# Patient Record
Sex: Male | Born: 1980 | Race: Black or African American | Hispanic: No | Marital: Single | Smoking: Never smoker
Health system: Southern US, Community
[De-identification: ages and names within clinical notes are randomized; demographics above are authoritative.]

## PROBLEM LIST (undated history)

## (undated) DIAGNOSIS — I10 Essential (primary) hypertension: Secondary | ICD-10-CM

## (undated) DIAGNOSIS — N289 Disorder of kidney and ureter, unspecified: Secondary | ICD-10-CM

## (undated) DIAGNOSIS — K219 Gastro-esophageal reflux disease without esophagitis: Secondary | ICD-10-CM

## (undated) DIAGNOSIS — R229 Localized swelling, mass and lump, unspecified: Secondary | ICD-10-CM

## (undated) DIAGNOSIS — IMO0002 Reserved for concepts with insufficient information to code with codable children: Secondary | ICD-10-CM

## (undated) DIAGNOSIS — M329 Systemic lupus erythematosus, unspecified: Secondary | ICD-10-CM

## (undated) HISTORY — DX: Gastro-esophageal reflux disease without esophagitis: K21.9

## (undated) HISTORY — PX: DIALYSIS/PERMA CATHETER INSERTION: CATH118288

## (undated) HISTORY — PX: TONSILLECTOMY: SUR1361

## (undated) HISTORY — PX: WISDOM TOOTH EXTRACTION: SHX21

---

## 2003-08-16 ENCOUNTER — Emergency Department (HOSPITAL_COMMUNITY): Admission: EM | Admit: 2003-08-16 | Discharge: 2003-08-16 | Payer: Self-pay

## 2004-08-16 ENCOUNTER — Emergency Department (HOSPITAL_COMMUNITY): Admission: EM | Admit: 2004-08-16 | Discharge: 2004-08-17 | Payer: Self-pay | Admitting: Emergency Medicine

## 2006-01-26 ENCOUNTER — Emergency Department (HOSPITAL_COMMUNITY): Admission: EM | Admit: 2006-01-26 | Discharge: 2006-01-26 | Payer: Self-pay | Admitting: Internal Medicine

## 2006-07-26 ENCOUNTER — Emergency Department (HOSPITAL_COMMUNITY): Admission: EM | Admit: 2006-07-26 | Discharge: 2006-07-27 | Payer: Self-pay | Admitting: Emergency Medicine

## 2006-11-20 ENCOUNTER — Emergency Department (HOSPITAL_COMMUNITY): Admission: EM | Admit: 2006-11-20 | Discharge: 2006-11-20 | Payer: Self-pay | Admitting: Emergency Medicine

## 2006-11-27 ENCOUNTER — Ambulatory Visit: Payer: Self-pay | Admitting: Internal Medicine

## 2007-10-14 ENCOUNTER — Emergency Department (HOSPITAL_COMMUNITY): Admission: EM | Admit: 2007-10-14 | Discharge: 2007-10-14 | Payer: Self-pay | Admitting: Emergency Medicine

## 2008-06-23 ENCOUNTER — Emergency Department (HOSPITAL_COMMUNITY): Admission: EM | Admit: 2008-06-23 | Discharge: 2008-06-23 | Payer: Self-pay | Admitting: Emergency Medicine

## 2008-06-26 ENCOUNTER — Ambulatory Visit: Payer: Self-pay | Admitting: Critical Care Medicine

## 2008-06-26 DIAGNOSIS — J479 Bronchiectasis, uncomplicated: Secondary | ICD-10-CM

## 2008-07-01 ENCOUNTER — Telehealth (INDEPENDENT_AMBULATORY_CARE_PROVIDER_SITE_OTHER): Payer: Self-pay | Admitting: *Deleted

## 2008-07-24 ENCOUNTER — Ambulatory Visit: Payer: Self-pay | Admitting: Critical Care Medicine

## 2008-08-05 ENCOUNTER — Ambulatory Visit: Payer: Self-pay | Admitting: Internal Medicine

## 2008-08-05 ENCOUNTER — Ambulatory Visit: Payer: Self-pay | Admitting: Cardiology

## 2008-08-05 ENCOUNTER — Inpatient Hospital Stay (HOSPITAL_COMMUNITY): Admission: EM | Admit: 2008-08-05 | Discharge: 2008-08-11 | Payer: Self-pay | Admitting: Emergency Medicine

## 2008-08-05 DIAGNOSIS — I309 Acute pericarditis, unspecified: Secondary | ICD-10-CM | POA: Insufficient documentation

## 2008-08-06 ENCOUNTER — Encounter (INDEPENDENT_AMBULATORY_CARE_PROVIDER_SITE_OTHER): Payer: Self-pay | Admitting: Emergency Medicine

## 2008-08-10 ENCOUNTER — Encounter: Payer: Self-pay | Admitting: Cardiology

## 2008-08-18 ENCOUNTER — Ambulatory Visit: Payer: Self-pay | Admitting: Critical Care Medicine

## 2008-08-23 ENCOUNTER — Ambulatory Visit: Payer: Self-pay | Admitting: Internal Medicine

## 2008-08-23 ENCOUNTER — Inpatient Hospital Stay (HOSPITAL_COMMUNITY): Admission: EM | Admit: 2008-08-23 | Discharge: 2008-09-01 | Payer: Self-pay | Admitting: Emergency Medicine

## 2008-08-23 ENCOUNTER — Encounter: Payer: Self-pay | Admitting: Internal Medicine

## 2008-08-26 ENCOUNTER — Ambulatory Visit: Payer: Self-pay | Admitting: Cardiology

## 2008-08-27 ENCOUNTER — Other Ambulatory Visit: Payer: Self-pay | Admitting: Cardiology

## 2008-08-28 ENCOUNTER — Other Ambulatory Visit: Payer: Self-pay | Admitting: Cardiology

## 2008-08-28 ENCOUNTER — Encounter: Payer: Self-pay | Admitting: Cardiology

## 2008-08-31 ENCOUNTER — Encounter: Payer: Self-pay | Admitting: Cardiology

## 2008-09-08 ENCOUNTER — Encounter: Payer: Self-pay | Admitting: Critical Care Medicine

## 2008-10-06 ENCOUNTER — Ambulatory Visit: Payer: Self-pay | Admitting: Cardiology

## 2008-10-08 ENCOUNTER — Encounter: Payer: Self-pay | Admitting: Cardiology

## 2008-10-08 ENCOUNTER — Ambulatory Visit: Payer: Self-pay

## 2008-11-10 ENCOUNTER — Encounter: Payer: Self-pay | Admitting: Cardiology

## 2008-11-10 ENCOUNTER — Ambulatory Visit: Payer: Self-pay | Admitting: Cardiology

## 2008-11-10 ENCOUNTER — Encounter: Payer: Self-pay | Admitting: Physician Assistant

## 2008-11-10 DIAGNOSIS — I319 Disease of pericardium, unspecified: Secondary | ICD-10-CM | POA: Insufficient documentation

## 2009-04-02 ENCOUNTER — Telehealth (INDEPENDENT_AMBULATORY_CARE_PROVIDER_SITE_OTHER): Payer: Self-pay | Admitting: *Deleted

## 2009-04-06 ENCOUNTER — Emergency Department (HOSPITAL_COMMUNITY): Admission: EM | Admit: 2009-04-06 | Discharge: 2009-04-07 | Payer: Self-pay | Admitting: Emergency Medicine

## 2009-04-08 ENCOUNTER — Emergency Department (HOSPITAL_COMMUNITY): Admission: EM | Admit: 2009-04-08 | Discharge: 2009-04-09 | Payer: Self-pay | Admitting: Emergency Medicine

## 2009-06-16 ENCOUNTER — Telehealth (INDEPENDENT_AMBULATORY_CARE_PROVIDER_SITE_OTHER): Payer: Self-pay | Admitting: *Deleted

## 2009-09-27 ENCOUNTER — Emergency Department (HOSPITAL_COMMUNITY): Admission: EM | Admit: 2009-09-27 | Discharge: 2009-09-27 | Payer: Self-pay | Admitting: Emergency Medicine

## 2009-09-30 ENCOUNTER — Ambulatory Visit: Payer: Self-pay | Admitting: Critical Care Medicine

## 2009-09-30 DIAGNOSIS — J209 Acute bronchitis, unspecified: Secondary | ICD-10-CM | POA: Insufficient documentation

## 2009-09-30 DIAGNOSIS — K1120 Sialoadenitis, unspecified: Secondary | ICD-10-CM

## 2009-12-03 ENCOUNTER — Encounter (INDEPENDENT_AMBULATORY_CARE_PROVIDER_SITE_OTHER): Payer: Self-pay | Admitting: *Deleted

## 2010-01-13 ENCOUNTER — Encounter: Payer: Self-pay | Admitting: Cardiology

## 2010-01-20 ENCOUNTER — Telehealth (INDEPENDENT_AMBULATORY_CARE_PROVIDER_SITE_OTHER): Payer: Self-pay | Admitting: *Deleted

## 2010-05-19 ENCOUNTER — Emergency Department (HOSPITAL_COMMUNITY): Admission: EM | Admit: 2010-05-19 | Discharge: 2010-05-19 | Payer: Self-pay | Admitting: Emergency Medicine

## 2010-05-23 ENCOUNTER — Emergency Department (HOSPITAL_COMMUNITY): Admission: EM | Admit: 2010-05-23 | Discharge: 2010-05-23 | Payer: Self-pay | Admitting: Emergency Medicine

## 2010-07-23 ENCOUNTER — Emergency Department (HOSPITAL_COMMUNITY): Admission: EM | Admit: 2010-07-23 | Discharge: 2010-07-23 | Payer: Self-pay | Admitting: Emergency Medicine

## 2010-11-08 NOTE — Progress Notes (Signed)
Summary: Pulmonary will continue to treat.  ---- Converted from flag ---- ---- 01/13/2010 8:38 PM, Elsie Stain MD wrote: I dont know why he will not make his appts with cardiology he has been consistent in his ov with me and is not hard to care for  I disagree with this,  however understand why you dismiss him , but I will still see him in pulmonary    pw   ---- 01/13/2010 2:12 PM, Sherri Sear wrote: This patient is dismissed from Cardiology by Minus Breeding, MD. Do you agree with the dismissal or do you wish to continue to treat this patient? Thank You. ------------------------------

## 2010-11-08 NOTE — Letter (Signed)
Summary: Discharge Letter  [facility] [address] [place], [place] [address]          January 13, 2010 MRN: [id number]   [place],   [address]   Dear Mr. Adamczak,  I find it necessary to inform you that I will not be able to provide medical care to you, because you have cancelled late or no showed for four appointments in the last year.                                                                                                .  Since your condition requires medical attention, I suggest that you place your self under the care of another physician without delay. If you desire, I will be available for emergency care for 30 days after you receive this letter.  This should give you ample time to select a physician of your choice from the many competent providers in this area. You may want to call the local medical society or [facility]'s physician referral service [phone]) for their assistance in locating a new physician. With your written authorization, I will make a copy of your medical record available to your new physician.   Sincerely,  [facility]

## 2010-11-08 NOTE — Letter (Signed)
Summary: Appointment - Missed  [place], [place]    Phone:   Fax:      December 03, 2009 MRN: [id number]   [facility] [place], [place]  [address]   Dear Jorge [name],  Our records indicate you missed your appointment on   12-02-2009  with  Dr. Warren Lacy   It is very important that we reach you to reschedule this appointment. We look forward to participating in your health care needs. Please contact us at the number listed above at your earliest convenience to reschedule this appointment.     Sincerely,     [facility]

## 2010-12-21 LAB — CBC
HCT: 48 % (ref 39.0–52.0)
Hemoglobin: 15.8 g/dL (ref 13.0–17.0)
MCH: 27.1 pg (ref 26.0–34.0)
MCHC: 32.9 g/dL (ref 30.0–36.0)
MCV: 82.3 fL (ref 78.0–100.0)
Platelets: 195 10*3/uL (ref 150–400)
RBC: 5.83 MIL/uL — ABNORMAL HIGH (ref 4.22–5.81)
RDW: 14.3 % (ref 11.5–15.5)
WBC: 4.1 10*3/uL (ref 4.0–10.5)

## 2010-12-21 LAB — POCT I-STAT 3, ART BLOOD GAS (G3+)
Acid-Base Excess: 2 mmol/L (ref 0.0–2.0)
Bicarbonate: 25.9 mEq/L — ABNORMAL HIGH (ref 20.0–24.0)
O2 Saturation: 96 %
Patient temperature: 98.6
TCO2: 27 mmol/L (ref 0–100)
pCO2 arterial: 38.4 mmHg (ref 35.0–45.0)
pH, Arterial: 7.438 (ref 7.350–7.450)
pO2, Arterial: 79 mmHg — ABNORMAL LOW (ref 80.0–100.0)

## 2010-12-21 LAB — BASIC METABOLIC PANEL
BUN: 7 mg/dL (ref 6–23)
CO2: 27 mEq/L (ref 19–32)
Calcium: 9.3 mg/dL (ref 8.4–10.5)
Chloride: 104 mEq/L (ref 96–112)
Creatinine, Ser: 0.92 mg/dL (ref 0.4–1.5)
GFR calc Af Amer: >60 mL/min (ref >60)
GFR calc non Af Amer: >60 mL/min (ref >60)
Glucose, Bld: 69 mg/dL — ABNORMAL LOW (ref 70–99)
Potassium: 3.9 mEq/L (ref 3.5–5.1)
Sodium: 138 mEq/L (ref 135–145)

## 2010-12-21 LAB — URINE MICROSCOPIC-ADD ON

## 2010-12-21 LAB — SEDIMENTATION RATE: Sed Rate: 3 mm/hr (ref 0–16)

## 2010-12-21 LAB — URINALYSIS, ROUTINE W REFLEX MICROSCOPIC
Bilirubin Urine: NEGATIVE
Glucose, UA: NEGATIVE mg/dL
Hgb urine dipstick: NEGATIVE
Ketones, ur: NEGATIVE mg/dL
Leukocytes, UA: NEGATIVE
Nitrite: NEGATIVE
Protein, ur: 30 mg/dL — AB
Specific Gravity, Urine: 1.011 (ref 1.005–1.030)
Urobilinogen, UA: 0.2 mg/dL (ref 0.0–1.0)
pH: 7 (ref 5.0–8.0)

## 2010-12-21 LAB — DIFFERENTIAL
Basophils Absolute: 0 10*3/uL (ref 0.0–0.1)
Basophils Relative: 1 % (ref 0–1)
Eosinophils Absolute: 0.1 10*3/uL (ref 0.0–0.7)
Eosinophils Relative: 2 % (ref 0–5)
Lymphocytes Relative: 52 % — ABNORMAL HIGH (ref 12–46)
Lymphs Abs: 2.2 10*3/uL (ref 0.7–4.0)
Monocytes Absolute: 0.5 10*3/uL (ref 0.1–1.0)
Monocytes Relative: 12 % (ref 3–12)
Neutro Abs: 1.3 10*3/uL — ABNORMAL LOW (ref 1.7–7.7)
Neutrophils Relative %: 33 % — ABNORMAL LOW (ref 43–77)

## 2010-12-22 LAB — COMPREHENSIVE METABOLIC PANEL
ALT: 41 U/L (ref 0–53)
BUN: 7 mg/dL (ref 6–23)
CO2: 26 mEq/L (ref 19–32)
Calcium: 9.2 mg/dL (ref 8.4–10.5)
GFR calc Af Amer: >60 mL/min (ref >60)
GFR calc non Af Amer: >60 mL/min (ref >60)
Glucose, Bld: 77 mg/dL (ref 70–99)
Potassium: 3.8 mEq/L (ref 3.5–5.1)
Total Protein: 8.8 g/dL — ABNORMAL HIGH (ref 6.0–8.3)

## 2010-12-22 LAB — SEDIMENTATION RATE: Sed Rate: 7 mm/hr (ref 0–16)

## 2010-12-22 LAB — DIFFERENTIAL
Basophils Relative: 0 % (ref 0–1)
Eosinophils Absolute: 0.1 10*3/uL (ref 0.0–0.7)
Eosinophils Relative: 1 % (ref 0–5)
Lymphocytes Relative: 51 % — ABNORMAL HIGH (ref 12–46)
Monocytes Absolute: 1 10*3/uL (ref 0.1–1.0)
Neutrophils Relative %: 31 % — ABNORMAL LOW (ref 43–77)

## 2010-12-22 LAB — CBC
Platelets: 200 10*3/uL (ref 150–400)
RBC: 5.73 MIL/uL (ref 4.22–5.81)
WBC: 6 10*3/uL (ref 4.0–10.5)

## 2011-01-09 LAB — SEDIMENTATION RATE: Sed Rate: 45 mm/hr — ABNORMAL HIGH (ref 0–16)

## 2011-01-09 LAB — CBC
MCHC: 31.5 g/dL (ref 30.0–36.0)
MCV: 76.3 fL — ABNORMAL LOW (ref 78.0–100.0)
Platelets: 295 10*3/uL (ref 150–400)
WBC: 8.3 10*3/uL (ref 4.0–10.5)

## 2011-01-09 LAB — POCT I-STAT, CHEM 8
Calcium, Ion: 1.15 mmol/L (ref 1.12–1.32)
HCT: 44 % (ref 39.0–52.0)
Hemoglobin: 15 g/dL (ref 13.0–17.0)
Sodium: 141 mEq/L (ref 135–145)
TCO2: 28 mmol/L (ref 0–100)

## 2011-01-09 LAB — DIFFERENTIAL
Basophils Relative: 1 % (ref 0–1)
Eosinophils Absolute: 0 10*3/uL (ref 0.0–0.7)
Neutro Abs: 5.5 10*3/uL (ref 1.7–7.7)
Neutrophils Relative %: 67 % (ref 43–77)

## 2011-01-09 LAB — MONONUCLEOSIS SCREEN: Mono Screen: NEGATIVE

## 2011-01-15 LAB — POCT CARDIAC MARKERS
Myoglobin, poc: 250 ng/mL (ref 12–200)
Troponin i, poc: <0.05 ng/mL (ref 0.00–0.09)

## 2011-01-15 LAB — BASIC METABOLIC PANEL
Calcium: 8.7 mg/dL (ref 8.4–10.5)
Creatinine, Ser: 0.76 mg/dL (ref 0.4–1.5)
GFR calc Af Amer: >60 mL/min (ref >60)
GFR calc non Af Amer: >60 mL/min (ref >60)
Sodium: 138 mEq/L (ref 135–145)

## 2011-01-15 LAB — DIFFERENTIAL
Basophils Relative: 1 % (ref 0–1)
Lymphocytes Relative: 37 % (ref 12–46)
Monocytes Relative: 11 % (ref 3–12)
Neutro Abs: 3.6 10*3/uL (ref 1.7–7.7)
Neutrophils Relative %: 51 % (ref 43–77)

## 2011-01-15 LAB — CBC
MCHC: 31.8 g/dL (ref 30.0–36.0)
RBC: 4.61 MIL/uL (ref 4.22–5.81)
WBC: 6.9 10*3/uL (ref 4.0–10.5)

## 2011-01-15 LAB — URINALYSIS, ROUTINE W REFLEX MICROSCOPIC
Bilirubin Urine: NEGATIVE
Glucose, UA: NEGATIVE mg/dL
Ketones, ur: NEGATIVE mg/dL
Nitrite: NEGATIVE
pH: 6.5 (ref 5.0–8.0)

## 2011-01-15 LAB — SEDIMENTATION RATE: Sed Rate: 50 mm/hr — ABNORMAL HIGH (ref 0–16)

## 2011-01-31 ENCOUNTER — Ambulatory Visit: Payer: Self-pay | Admitting: Physician Assistant

## 2011-02-21 NOTE — H&P (Signed)
NAMEMarland Kitchen  Jorge [name], Jorge [name] NO.:  [id number]   MEDICAL RECORD NO.:  [id number]          PATIENT TYPE:  INP   LOCATION:  2922                         FACILITY:  [facility]   PHYSICIAN:  Christena Deem. Melvyn Novas, MD, FCCPDATE OF BIRTH:  06-Apr[birth year]1983   DATE OF ADMISSION:  08/05/2008  DATE OF DISCHARGE:                              HISTORY & PHYSICAL   CHIEF COMPLAINT:  Shortness of breath and cough as well as chest  discomfort.   HISTORY OF PRESENT ILLNESS:  This is a 30 year old chronically ill  African American male who states he was in his usual state of health on  August 02, 2008.  At that point, he began to recall initially a  nonproductive cough.  He did report a recent sick exposure to his little  brother who had upper respiratory tract infection symptoms.  On October  27/28, 2009, he began to develop increased nasal drainage.  First noted  fever as high as 102 on the August 04, 2008.  On August 03, 2008, he  developed increased chest discomfort primarily noted with cough, and  deep breath, this did not improve with repositioning, it was resultant  in significant insomnia and discomfort.  He began to notice increased  shortness of breath on August 04, 2008, much above his baseline.  He  began to become more orthopneic during the evening of the August 04, 2008, he developed progressive exertional dyspnea.  On the a.m. of  August 05, 2008, he developed acute resting dyspnea which resulted in  him reporting to the emergency room.  Additionally, he did note that on  August 04, 2008, he began to notice that his cough became productive  purulent yellow thick sputum.  He denies hemoptysis, lower extremity  edema, did have 1 episode of vomiting.  Chest x-ray on presentation  demonstrate bilateral pulmonary infiltrates, right greater than left,  treatment to date has included IV Rocephin, supplemental oxygen which he  reports his dyspnea is a bit improved, however, he continues to  have  significant chest and back discomfort.   PAST MEDICAL HISTORY:  1. 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  2. History of asthma.  3. Esophageal dysmotility.   SOCIAL HISTORY:  Unemployed, lives with his mother.  Never smoker, last  seen by Dr. Asencion Noble in October 2009.  He did get his flu vaccine  this year, states at baseline he can tolerate normal ADLs.  He states he  can walk approximately 5 miles at baseline without difficulty.   FAMILY HISTORY:  Negative for pulmonary disease.   ALLERGIES:  He states he is allergic to PREDNISONE, develops agitation  and night sweats.   HOME MEDICATIONS:  Include Symbicort 160/4.5 two puffs b.i.d. and p.r.n.  ProAir HFA.   REVIEW OF SYSTEMS:  Per HPI.  No other pertinent positives.   PHYSICAL EXAMINATION:  VITAL SIGNS:  Currently, heart rate 110-118,  blood pressure 118/57, respirations 20-30, saturations 92% on 3 liters  nasal cannula, and temperature is pending.  GENERAL:  This is a ill-appearing 30 year old male patient currently  with multiple systemic complaints, but no acute distress.  HEENT:  Normocephalic.  No JVD.  He does have painful anterior cervical  adenopathy to palpation.  Mucous membranes are dry, sclerae are  nonicteric.  PULMONARY:  Basilar rales bilaterally with scattered rhonchi.  No  accessory muscle use.  CARDIAC:  Regular rate and rhythm.  EXTREMITIES:  No edema, there is clubbing noted on both hands.  ABDOMEN:  Soft and nontender.  GU:  Clear yellow urine.  NEUROLOGIC:  Grossly intact.   LABORATORY DATA:  Hemoglobin 13.3 and hematocrit 39.  Sodium 137,  potassium 4.4, chloride 109, glucose 100, BUN 45, and creatinine 2.8.  White blood cell count 9.4 and platelet count 354.  Cardiac markers, CK-  MB is 18.1, troponin I less than 0.05.   IMPRESSION AND PLAN:  1. Possible  pneumonia (likely community-acquired  with positive      Pseudomonas risk factors in the setting of significant      bronchiectasis).  Therefore, plan will be to admit to Step-down      Unit, we will empirically treat him with IV Avelox, as well as      Zosyn given Pseudomonas risk factor.  Additionally, we will check      H1N1 nasal swab, send urine strep Legionella antigen, sputum      culture, blood cultures x2, urinalysis, urine drug screen, and      repeat cardiac markers.  Mr. Banuelos at this point is not in acute      distress, but given his underlying pulmonary pathology and      bronchiectasis he is at high risk for recurrent respiratory      failure.  We will additionally provide supplemental oxygen, and      inhaled bronchodilators.  We will refrain from IV systemic steroids      at this point.  2. Systemic inflammatory response symptoms/severe sepsis secondary to      bilateral pneumonia.  Mr. Delgrande is slightly tachypneic,      tachycardic, and febrile meeting SIRS criteria, in the setting of      pneumonia, meeting sepsis with renal failure resulting in severe      sepsis criteria.  We will admit him to the Step-down Setting,      provide aggressive IV hydration, check lactic acid, and closely      monitor.  At this point, I think we can hold off on early goal-      directed therapy protocol, however, should lactic acid be elevated,      or he develop clinical decline, we would have low threshold for      such.  3. Acute renal failure secondary to volume depletion.  Plan for this      is to provide aggressive IV hydration, and recheck electrolytes,      and creatinine.  4. Back pain/chest pain problem secondary to bilateral pneumonia.      Plan for this is supportive care.   Best practice plan for this is to provide proton pump inhibitors, subcu  heparin, and continue best practice efforts.      Salvadore Dom, NP      Christena Deem. Melvyn Novas, MD, [facility]  Electronically Signed    PB/MEDQ  D:  08/05/2008  T:   08/05/2008  Job:  [id number]

## 2011-02-21 NOTE — Consult Note (Signed)
NAME:  Jorge [name], Jorge [name] NO.:  [id number]   MEDICAL RECORD NO.:  [id number]          PATIENT TYPE:  INP   LOCATION:  [id number]                         FACILITY:  [facility]   PHYSICIAN:  Minus Breeding, MD, FACCDATE OF BIRTH:  May 30, 1981   DATE OF CONSULTATION:  08/05/2008  DATE OF DISCHARGE:                                 CONSULTATION   REFERRING PHYSICIAN:  Christena Deem. Wert, MD, FCCP   REASON FOR CONSULTATION:  Evaluate a patient with chest pain and  elevated cardiac enzymes.   HISTORY OF PRESENT ILLNESS:  The patient is a pleasant 30 year old  without prior cardiac history.  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  He also has a history of asthma.  He says  he was in his usual state of health until about 3 nights ago.  He had  some coughing and shortness of breath.  He had a sudden onset of some  severe chest discomfort.  He points to his mid substernal area.  He said  it was sharp.  It was intense.  He had this discomfort with inspiration  or certain movements.  It was worse when he was in left lateral  position.  He was somewhat short of breath but not overtly so.  He had a  cough that was productive of just a white sputum.  He did treat himself  with bronchodilators and heating pad.  The discomfort did go away that  next morning.  He had recurrence of this 1 day prior to presentation  yesterday.  Again, the same pattern.  He had discomfort early this  morning as well.  He did take some pain medications.  He presented to  the emergency room.  Here he was found to have a temperature of 102.2.  Chest x-ray suggested right lower lobe atelectasis and probable  pneumonia as well as suspected lingular pneumonia.  He has been managed  with antibiotics and bronchodilators.  Of note, his EKG demonstrates  diffuse ST-segment elevation with depression in aVR.  He has PR  depression notable as well.  He does have some elevated cardiac enzymes  with his  troponin 1.17.  His peak CK is 325 and MB of 5.6.  Currently  the patient is pain-free.   He says when he is feeling well he is able to play basketball.  He does  not have any chest discomfort, neck or arm discomfort.  He has had no  palpitation, presyncope or syncope.  He does not have any PND or  orthopnea.  He has not had prior cardiac history.   PAST MEDICAL HISTORY:  Of pulmonary hyperplasia as a child, asthma.   PAST SURGICAL HISTORY:  Wisdom teeth extraction, tonsillectomy.   ALLERGIES:  PREDNISONE caused hallucinations.   MEDICATIONS:  Symbicort, ProAir.   SOCIAL HISTORY:  The patient lives in [place] with his mother and his  brother.  He is unemployed.  He does not smoke cigarettes or drink  alcohol.   FAMILY HISTORY:  Is noncontributory for early coronary artery disease.   REVIEW OF SYSTEMS:  As stated in the  HPI, positive for lightheadedness  and presyncope with this illness.  Negative for all other systems.   PHYSICAL EXAMINATION:  GENERAL:  The patient seems somewhat somnolent  but is in no distress.  VITAL SIGNS:  Temperature 102.2, heart rate 108 and regular, respiratory  rate 20, blood pressure 132/78.  HEENT:  Eyes are unremarkable.  Pupils equal, round and reactive to  light.  Fundi not visualized.  Oral mucosa unremarkable.  NECK:  No jugular venous distention at 45 degrees.  Carotid upstroke  brisk and symmetric.  No bruits.  No thyromegaly.  LYMPHATICS:  No cervical, axillary, inguinal lymphadenopathy.  LUNGS:  Decreased breath sounds with slight rhonchorous sounds, no  wheezing, no crackles.  BACK:  No costovertebral angle tenderness.  CHEST:  Unremarkable.  HEART:  PMI not displaced or sustained, S1 and S2 within normal.  No S3,  no S4, no clicks, no rubs, no murmurs.  ABDOMEN:  Flat, positive bowel sounds normal in frequency and pitch, no  bruits, rebound, guarding or midline pulsatile mass.  No hepatomegaly,  no splenomegaly.  SKIN:  No rashes.   No nodules.  EXTREMITIES:  2+ pulses throughout.  No edema, no cyanosis, no clubbing.  NEUROLOGICAL:  Oriented to place, place and time.  Cranial nerves II-XII  grossly intact.  Motor grossly intact.   LABS:  WBC 9.4, hemoglobin 12.3, platelets 354, sodium 137, potassium  4.4, BUN 45, creatinine 2.8.   IMPRESSION:  1. Chest pain.  The patient's chest pain is consistent with      pericarditis.  His EKG demonstrates diffuse ST-segment elevation      consistent with this as well.  He also has a slight PR depression.      His enzymes would be consistent with this.  Given this the working      diagnosis will be pericarditis.  Right now he is relatively pain-      free.  I will start him on colchicine.  I would use Indocin except      his renal insufficiency makes that somewhat problematic.  We can      start that if this improves.  Will get an echocardiogram and rule      out pericardial effusion.  2. Pneumonia; per the primary team.  3. Renal insufficiency.  Apparently he has been sick enough for the      last 3 days the primary team believes this is all related to      dehydration.  We will follow his renal function carefully.      Minus Breeding, MD, [facility]  Electronically Signed     JH/MEDQ  D:  08/05/2008  T:  08/05/2008  Job:  [id number]

## 2011-02-21 NOTE — H&P (Signed)
NAME:  Jorge [name], Jorge [name] NO.:  [id number]   MEDICAL RECORD NO.:  [id number]          PATIENT TYPE:  INP   LOCATION:  1404                         FACILITY:  [facility]   PHYSICIAN:  Champ Mungo. Lovena Le, MD    DATE OF BIRTH:  24-Nov-1980   DATE OF ADMISSION:  08/23/2008  DATE OF DISCHARGE:                              HISTORY & PHYSICAL   ADMITTING DIAGNOSES:  Cough, chest pain, and shortness of breath with a  questionable enlarging pericardial effusion.   HISTORY OF PRESENT ILLNESS:  The patient is a 30 year old man who has a  history of pulmonary dysplasia with bronchiectasis.  He was diagnosed  after being admitted earlier in October with chest pain and was found to  have pericardial effusion with percarditis.  At that point, the patient  was discharged with colchicine and prednisone in tapering doses.  He was  initially on 40 a day and is down to 30 a day.  He was in his usual  state of health until yesterday when he noted recurrent cough, shortness  of breath, and chest pain and presented today for additional evaluation.  He has had chills, but no documented fever.  The patient was recently  found to be ANA and rheumatoid factor positive.  He also notes a chronic  productive cough.  He has had no frank syncope.   PAST MEDICAL HISTORY:  Notable for history of asthma.  As noted, he has  pulmonary dysplasia with long period in the ICU when he was born.   SOCIAL HISTORY:  He is unemployed.  He lives with his mother.  Never a  smoker.  He has seen Dr. Joya Gaskins most recently in October.  He has had a  flu vaccine.  The patient currently has been evicted from his house.   FAMILY HISTORY:  Noncontributory.   REVIEW OF SYSTEMS:  As noted in the HPI.  Otherwise, his only other main  complaint is chest pain, which is better when he lies down or sits up,  worse when he lies in the right over the left side.  He also notes a  rash on his face and his arms.   PHYSICAL EXAMINATION:   GENERAL:  Notable for him being a pleasant young  man in no acute distress.  VITAL SIGNS:  Blood pressure was 117/70, the pulse was 100 and regular,  respirations were 20, and temperature was 99.  HEENT:  Normocephalic and atraumatic.  Pupils equal and round.  Oropharynx is moist.  Sclerae anicteric.  I do not appreciate any rashes  or conjunctival hemorrhage over the face and neck.  NECK:  No jugular venous distention.  There are no thyromegaly.  Trachea  is midline.  Carotids are 2+ and symmetric.  LUNGS:  Clear bilaterally to auscultation.  No wheezes, rales, or  rhonchi are present.  There is no increased work of breathing.  I do not  here a friction or rub.  CARDIOVASCULAR:  Regular rate and rhythm.  Normal S1 and S2.  There is  soft S4 gallop.  PMI was not enlarged, nor was laterally displaced.  I  did not hear a friction rub on his cardiac exam.  ABDOMEN:  Soft, nontender, and nondistended.  There is organomegaly.  There is no rebound or guarding.  EXTREMITIES:  No cyanosis, clubbing, or edema.  SKIN:  Otherwise, normal.  NEUROLOGIC:  Alert and oriented x2.  Cranial nerves intact.  Strength is  5/5 and symmetric.   His EKG demonstrates sinus rhythm.  There was no QRS alternans present.  The patient had nonspecific ST-T wave abnormality.  There were no  diagnostic criteria for acute pericarditis today unlike prior ECGs which  demonstrated diffuse ST segment elevations and T-wave inversions.   LABORATORY DATA:  Notable for a white count of 11,000.  The patient was  anemic.  The creatinine was 0.86, previously in the over 2 range.   IMPRESSION:  1. Recurrent chest pain and shortness of breath worsen for acute      pericarditis.  A 2-D which is done in the emergency room today and      reportedly demonstrates slightly enlarging pericardial effusion,      but without evidence of clear tamponade.  2. History of pulmonary dysplasia and bronchiectasis and pneumonia.  3. Chronic  renal insufficiency with improvement.  4. Pneumonia with persistent elevated white blood cell count in the      settings of ongoing prednisone therapy.  5. Antinuclear antibody positive, questionable lupus.  6. Recent problem with loss of home residence.   PLAN:  Admit the patient to begin IV hydrocortisone.  We will not use  any drugs to thin the blood.  We will follow the patient's exam for  evidence of pulses paradoxes and plan to repeat his 2-D echo which  regard to therapy on fairly high doses of prednisone.  He has had  worsening symptoms.  For this reason, I will start him on IV  hydrocortisone and hopefully, we will control his symptoms initially  with this before transitioning over to more oral steroids, continuing  his colchicine and consider initiation of NSAIDs, now that his renal  function is improved.      Champ Mungo. Lovena Le, MD  Electronically Signed     GWT/MEDQ  D:  08/23/2008  T:  08/24/2008  Job:  [id number]

## 2011-02-21 NOTE — Discharge Summary (Signed)
NAME:  Jorge [name], Jorge [name] NO.:  [id number]   MEDICAL RECORD NO.:  [id number]          PATIENT TYPE:  INP   LOCATION:  [id number]                         FACILITY:  [facility]   PHYSICIAN:  Minus Breeding, MD, FACCDATE OF BIRTH:  Feb 22, 1981   DATE OF ADMISSION:  08/26/2008  DATE OF DISCHARGE:  09/01/2008                               DISCHARGE SUMMARY   PRIMARY CARDIOLOGIST:  Minus Breeding, MD, [facility]   CONSULTING PHYSICIAN:  Lindaann Slough, MD.   PROCEDURE PERFORMED DURING HOSPITALIZATION:  Echocardiogram.   FINAL DISCHARGE DIAGNOSES:  1. Pericardial effusion with no evidence of tamponade.  2. Hypertension.  3. History of asthma.  4. Pulmonary dysplasia with long period in Intensive Care Unit when he      was born.   HOSPITAL COURSE:  The patient is a 30 year old African American male  with a history of pulmonary dysplasia and bronchiectasis who was  diagnosed after being admitted in October 2009 with chest pain and found  to have a pericardial effusion with pericarditis.  The patient in  October was discharged with colchicine and prednisone in tapering doses.  He was initially on 40 day and was down to 30 day.  The patient was  admitted because he had chills with no documented fever or chest pain  and was also recently found to have an ANA rheumatoid factor positive  with a chronic productive cough.  The patient was seen and examined on  initial admission by Dr. Cristopher Peru and admitted.  The patient was  began on IV steroids and pulses paradoxus was completed which was  negative.  The patient was followed by Dr. Minus Breeding, his primary  cardiologist and monitored closely.  He was mildly febrile on admission  with a temperature of 100.2 and hypertensive with a blood pressure of  160/90.  The patient's creatinine was 0.76 on admission.  Echocardiogram  was completed on initial evaluation to compared to prior pericardial  effusion.  The pericardial effusion on the  echocardiogram was now large  with question of some mild RV collapse, though no evidence of tamponade,  August 19, 2008.  The patient remained on IV steroids and Dr. Hurley Cisco was consulted from Rheumatology.  However, the effusion was  clearly larger as compared to prior echocardiogram on August 10, 2008.   Dr. Hurley Cisco from Rheumatology was consulted for assistance in  evaluation with this patient and advisement concerning steroid therapy  and rheumatoid factor.  The patient was continued to be treated on IV  steroids 80 mg IV q.12 hours for approximately 5 days with tapering down  to p.o. steroids once pericardial effusion had improved.  The patient  was also started on calcium 500 mg twice a day per Dr. Charlestine Night secondary  to some mild hypertension that was persistent in the 0000000 systolic.  The  patient was placed on Norvasc 2.5 mg daily and was also was started on  Tessalon Perles for frequent coughing and comfort.  On August 28, 2008, the patient was diagnosed with SLE with plans to continue IV  steroids.  On August 29, 2008, the patient was transitioned to p.o.  steroids 30 mg twice a day and followed up by Dr. Charlestine Night.  The  patient's prednisone was decreased to 60 mg daily (30 mg b.i.d.) and  continuation of calcium twice a day.  Followup echo was completed on  September 01, 2008 showing pericardial effusion smaller.  The patient  remained slightly tachycardic on telemetry.  The patient was found to be  stable.  Blood pressure was better controlled with the use of Norvasc  which was increased to 5 mg daily.  The patient will follow up with Dr.  Percival Spanish and Dr. Charlestine Night on scheduled appointments within the next week  to 10 days.   DISCHARGE VITAL SIGNS:  Blood pressure 126/85, pulse 109, temperature  98.1, respirations 24, O2 sat 98% on room air, and weight 65 kg.   DISCHARGE LABORATORY DATA:  Anti-DNA A-B 268.  Sodium 138, potassium  4.0, chloride 105, CO2  28, glucose 117, BUN 15, and creatinine 0.77.  Hemoglobin 11.5, hematocrit 36.6, white blood cells 14.0, and platelets  347.  RNP greater than 8.0.  Rotondo antibody greater than 8.0.  Sjogren's  syndrome SSA 0.5, SSB 0.2.  Echocardiogram dated August 31, 2008,  pericardial effusion considerably smaller than on August 28, 2008.  It  is now small and posterior.  The pericardium does not appear thickened.  The thickening of the pericardium and the appearance of the  interventricular interdependent do suggest a component of pericardial  constriction, though the IVC is not dilated.  If this is a clinical  concern with either repeat echo with assessment of mitral inflow Doppler  using a respirometer or do hemodynamic catheterization.   DISCHARGE MEDICATIONS:  1. Norvasc 5 mg daily.  2. Prednisone 30 mg twice a day.  3. Calcium carbonate 500 mg twice a day.  4. Tessalon Perles 200 mg 3 times a day p.r.n.  5. Colchicine 0.6 mg twice a day.  6. Ventolin with ProAir 2 puffs every 4 hours p.r.n.   ALLERGIES:  No known drug allergies.   FOLLOWUP PLANS AND APPOINTMENTS:  1. The patient is to follow up with Dr. Minus Breeding on September 11, 2008 at 9:45 a.m.  2. The patient is to follow up with Dr. Charlestine Night on September 08, 2008 at      11 a.m.  3. The patient is to be given prescriptions for new medication.  4. The patient has been advised to call our office for any recurrence      of chest pain or symptoms.  5. The patient has been advised to bring all medications to followup      appointment.      Phill Myron. Purcell Nails, NP      Minus Breeding, MD, [facility]  Electronically Signed    KML/MEDQ  D:  09/01/2008  T:  09/01/2008  Job:  [id number]   cc:   Lindaann Slough, M.D.

## 2011-02-21 NOTE — Consult Note (Signed)
NAME:  Jorge [name], BOISE NO.:  [id number]   MEDICAL RECORD NO.:  [id number]          PATIENT TYPE:  INP   LOCATION:  4741                         FACILITY:  [facility]   PHYSICIAN:  Lindaann Slough, M.D.DATE OF BIRTH:  August 16, 1981   DATE OF CONSULTATION:  DATE OF DISCHARGE:                                 CONSULTATION   CHIEF COMPLAINT:  Chest pain, positive ANA.   Mr. Jorge [name] is a 30 year old black male with a history of pulmonary  dysplasia, congenitally.  He was premature and weighed 1 pound 10 ounces  at birth.  Over the past 2-3 months, he has had difficulty with  breathing.  He was hospitalized for approximately 5-7 days in late  October for pericardial effusion.  He went home and was on prednisone 30  mg a day when he developed further difficulties with his breathing along  with chest pain.  The breathing was pleuritic in nature and he was short  of breath.  He is also felt to have bronchiectasis.   At this time, he has been on Solu-Medrol 80 mg IV q.12 h. for  approximately 3-4 doses.  He has severe pain and nausea into the chest  yesterday and was transferred to [facility]  He is feeling much  better, presently.  He is not short of breath and states that the pain  in his chest is essentially gone.  When asking where he may hurt, he  denies any pain, but later says that his hands hurt.  He cannot close  his hands very well.  He feels that there were no swollen joints.  He  has had no rashes particularly to his face or elsewhere.  He says his  hands stay cold frequently and will turn blue.  No oral ulcers.  He had  a slight temperature of 100 degrees, when he was admitted on July 21, 2008.  He discussed that his weight has gone down from 159 to 141.  There has been no diarrhea.  He has had some cough with his  difficulties.  Dr. Bettina Gavia note of July 24, 2008, discussed that he  has had some recurrent hemoptysis, but this is not going on presently.   Labs over the last month had shown ANA 1:640 speckled, RF 56, and high  ESR.   PAST MEDICAL HISTORY:  Significant for the pulmonary dysplasia and  bronchiectasis.  He was able to play soccer and some basketball by the  time he was in his team, but was not playing in organized sports.  No  asthma.  Recent hospitalization in October 2009.   CURRENT MEDICINES:  1. Solu-Medrol 80 mg q.12 h.  2. Colchicine 0.6 mg b.i.d.  3. Albuterol p.r.n.  4. Guaifenesin.  5. Imodium p.r.n.  6. Ambien 5 mg nightly.  7. Hydrocodone p.r.n.   FAMILY HISTORY:  His mother is 44 years of age and has HTN and a lot of  stress.  His father is about 64 years of age.  He has difficulties with  his back.  He is on disability and possible history of lupus and  diabetes.   SOCIAL HISTORY:  He is [place] native.  He is presently unemployed  and has recently been declared disabled.  He never smoked and does not  drink alcohol.  Lives with his mother currently and they were needing to  change residence.   PHYSICAL EXAMINATION:  VITALS:  Weight is down on the chart.  Temperature is 97.9, pulse 90, regular.  Blood pressure 135/97,  respirations 18.  GENERAL:  He appears comfortable with his breathing and is not on  oxygen.  He is able to give a very good history.  SKIN:  Malar rash.  No nail fold dilatation as best I can see in the  white that I have.  HEENT:  PERL/EOMI.  Mouth clear.  NECK:  Negative JVD.  LUNGS:  He has slight rhonchi with fairly good air movement.  HEART:  Regular and no murmur.  ABDOMEN:  Negative HSM.  MUSCULOSKELETAL:  There is slight clubbing to the fingers and fullness  to the fingers.  They are quite warm and did not show symptoms of  Raynaud's presently.  Wrist, elbow, and shoulders have good range of  motion.  Knees, ankles and feet had a good range of motion and there was  no edema.  NEURO:  Reflexes are 1+.  Strength is 5/5.  Negative SLR.   ASSESSMENT AND PLAN:  1.  Pericardial effusion.  When he returned to the hospital, he was      found to have a moderately large pericardial effusion.  He has a      positive ANA.  The labs pending that include ds-DNA, Odoherty, RNP,      SSA and SSB antibodies.  This could possibly be lupus.  Other      etiology such as a viral entity are possible also.  He is currently      improving with the Solu-Medrol.  This will be used for several days      at this dose.  He has also had a urinalysis that shows 100      proteinuria and no blood.  His creatinine is 0.7.  2. Anemia.  His hemoglobin was 11.8, along with a WBC 7.5 and platelet      256.  3. Pulmonary dysplasia/bronchiectasis.           ______________________________  Lindaann Slough, M.D.     WWT/MEDQ  D:  08/27/2008  T:  08/27/2008  Job:  [id number]

## 2011-02-21 NOTE — Assessment & Plan Note (Signed)
[facility] OFFICE NOTE   Jorge [name], Jorge [name]                      MRN:          [id number]  DATE:11/10/2008                            DOB:          1981-04-19    This is a patient of Dr. Percival [name].   This is a 29 year old African American male patient who has a history of  pericarditis and was found to have a pericardial effusion in August 28, 2008.  He was treated with steroids and colchicine, and his effusion  resolved.  He comes in.   The patient presents today with 2-week history of worsening chest pain.  He describes it as stabbing chest pain, wakes him up from sleep.  It is  worse when he lays on his left side and hurts with deep inspiration.  He  has ran out of his colchicine and Norvasc, but continues to take  prednisone.  He says it feels the same as when he had his pericarditis  in November.  He denies any dizziness, presyncopal symptoms, or  dyspepsia.   CURRENT MEDICATIONS:  1. Norvasc 5 mg daily, he is out of.  2. He takes prednisone 5 mg one-half b.i.d.  3. Plaquenil 200 mg t.i.d.  4. Colchicine 0.6 b.i.d., he is out of.   PHYSICAL EXAMINATION:  GENERAL:  This is a 30 year old African American  male in no complaint of chest pain.  VITAL SIGNS:  Blood pressure 118/62, pulse 88, and weight 149.  NECK:  Without JVD, HJR, bruit, or thyroid enlargement.  LUNGS:  Clear in anterior, posterior, and lateral.  He does have a  popping rub in his left lung base.  HEART:  Regular rate and rhythm at 80 beats per minute.  Normal S1 and  S2.  He does have a rub on expiration.  He does not have a loud typical  pericardial friction rub.  He does have an S4.  ABDOMEN:  Soft without organomegaly, masses, lesions, or abnormal  tenderness.  EXTREMITIES:  Without cyanosis, clubbing, or edema.  He has g.ood distal  pulses.   EKG normal sinus rhythm with LVH and ST elevation consistent with  pericarditis and  nonspecific ST-T wave changes.  Echocardiogram, a stat  echo was done in our office and does not show evidence of pericardial  effusion.  He said there was some variations with respiration, but no  effusion.   IMPRESSION:  1. Chest pain, consistent with pericarditis.  2. Lupus.  3. History of asthma.   PLAN:  I spoke with Dr. Percival [name] who recommended increasing his  steroids, but wanted Dr. Charlestine [name] to advice on dosing.  We will also  resume his colchicine.  I spoke with Dr. Charlestine [name] who wants the patient  to come straight over to his office to be treated. He has missed several  appointments in the past and the patient is agreeable to go over. He is  on his way to Dr. Elmon [name] and we have called in colchicine for him.  He will see Dr. Percival [name] back in 1-2 months.      Ermalinda Barrios,  PA-C  Electronically Signed      Minus Breeding, MD, [facility]  Electronically Signed   ML/MedQ  DD: 11/10/2008  DT: 11/11/2008  Job #: [id number]   cc:   Lindaann Slough, M.D.  Burnett Harry Joya Gaskins, MD, FCCP

## 2011-02-21 NOTE — Assessment & Plan Note (Signed)
Jorge [name]                            CARDIOLOGY OFFICE NOTE   Jorge [name], Jorge [name]                      MRN:          [id number]  DATE:10/06/2008                            DOB:          [birth year]    RHEUMATOLOGIST:  Lindaann Slough, MD   PULMONOLOGIST:  Burnett Harry. Joya Gaskins, MD, FCCP   REASON FOR PRESENTATION:  Evaluate the patient with pericardial  effusion.   HISTORY OF PRESENT ILLNESS:  The patient presents for followup of the  above.  He was hospitalized at [facility] with chest discomfort and evidence  of pericarditis.  He was found to have a pericardial effusion.  He was  treated with nonsteroidals and then steroids.  He subsequently was found  to have evidence of lupus and is being followed by Dr. Charlestine Night.  This  has been managed with Plaquenil and prednisone.  Over time in the  hospital, his symptoms of chest discomfort and pericarditis improved.  His dyspnea which had been prominent on admission resolved.  Unfortunately, he failed to keep a couple of appointments and I had to  call his mother again him to come to the office today.  He says he has  been having some sporadic chest discomfort.  This has been similar to  his previous pain which had gone away completely.  It has been coming on  for the last several days.  It happens when he is laying on his left  side, appears it is worse with breathing.  It is not nearly severe as it  was.  It does not happen all the time when he is in that position.  He  is not short of breath.  He has not had any had any lightheadedness,  presyncope or syncope.  He denies any cough, fevers, or chills.  He  otherwise says he is feeling well.   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   ALLERGIES INTOLERANCES:  None.   MEDICATIONS:  1. Norvasc 5 mg daily.  2. Calcium carbonate.  3.  Colchicine 0.6 mg b.i.d. (he has been out of this).  4. Prednisone 7.5 mg b.i.d.  5. Plaquenil 200 mg t.i.d.   REVIEW OF SYSTEMS:  As stated in the HPI and otherwise negative for all  other systems.   PHYSICAL EXAMINATION:  GENERAL:  The patient is pleasant and in no  distress.  VITAL SIGNS:  Blood pressure 128/79, heart rate 99 and irregular (there  is no pulsus paradoxus), weight 148 pounds, and body mass index 24.  HEENT:  Eyelids are unremarkable; pupils equal and reactive to light;  fundi not visualized; oral mucosa unremarkable.  NECK:  No jugular venous distention at 45 degrees; carotid upstroke  brisk and symmetric; no bruits, no thyromegaly.  LYMPHATICS:  No cervical, axillary, or inguinal adenopathy.  LUNGS:  Clear to auscultation bilaterally.  BACK:  No costovertebral angle tenderness.  CHEST:  Unremarkable.  HEART:  PMI not displaced or sustained; S1 and S2 within normal limits;  no S3, no S4; no clicks,  no rubs, no murmurs.  ABDOMEN:  Flat; positive  bowel sounds normal in frequency and pitch; no bruits, no rebound, no  guarding; no midline pulsatile, no hepatomegaly, no splenomegaly.  SKIN:  No rashes, no nodules.  EXTREMITIES:  Pulses 2+ throughout; no edema, no cyanosis, no clubbing.  NEURO:  Oriented to person, place, and time; cranial nerves II-XII  grossly intact; motor grossly intact.   EKG sinus rhythm, rate 99, axis within normals, intervals within normal  limits, no acute ST-T wave changes or ST-segment elevation.   ASSESSMENT AND PLAN:  1. Pericardial effusion.  The patient did have large pericardial      effusion at one point.  It did fluctuate in size, but was moderate      at the time of discharge without evidence of tamponade.  The plan      was to follow this with an echocardiogram after he had been treated      for his lupus.  Unfortunately, he did not come to the followup      appointment.  Currently, he is not in any distress and does not       have any physical evidence of tamponade, although he does have some      recurrent chest discomfort.  I plan a repeat echocardiogram.  If he      has a retained large effusion or one of significant size, I will      discuss further therapy with Dr. Charlestine Night.  Ultimately, this      gentleman might need a pericardial window.  I will discuss with him      restarting the colchicine after the results of the echo.  2. Followup.  I will see him back based on the results of the      echocardiogram.     Minus Breeding, MD, [facility]  Electronically Signed    JH/MedQ  DD: 10/06/2008  DT: 10/07/2008  Job #: [id number]   cc:   Lindaann Slough, M.D.  Burnett Harry Joya Gaskins, MD, FCCP

## 2011-02-21 NOTE — Discharge Summary (Signed)
NAME:  Jorge [name], Jorge [name] NO.:  [id number]   MEDICAL RECORD NO.:  [id number]          PATIENT TYPE:  INP   LOCATION:  [id number]                         FACILITY:  [facility]   PHYSICIAN:  Chesley Mires, MD        DATE OF BIRTH:  [birth year]   DATE OF ADMISSION:  08/05/2008  DATE OF DISCHARGE:  08/11/2008                               DISCHARGE SUMMARY   FINAL DIAGNOSES:  1. Pulmonary dysplasia in the setting of underlying bronchiectasis,      asthma, and bronchiectasis flare.  2. Esophageal dysmotility.  3. Pericarditis with elevated rheumatoid factor and ANA positive.   LABORATORY DATA:  1. August 11, 2008:  Sodium 139, potassium 4.1, chloride 113, CO2 of      22, glucose 94, BUN 13, and creatinine 0.95.  2. August 11, 2008:  White blood cell count 12.3, hemoglobin 11.8,      hematocrit 36.3, and platelet count 504.  3. ANA titer 1:  640.  ANA positive.  Influenza A negative.  ASO titer      775.  Rheumatoid factor is 51.  ESR 96.  These are all obtained on      August 07, 2008.   BRIEF HISTORY:  This is a 30 year old male patient with a history of  pulmonary dysplasia in the setting of premature birth and prolonged  intensive care stay in Neonatal ICU.  He presented on August 05, 2008,  with chief complaint of fever, chest discomfort, and progressive dyspnea  as well as productive cough.  He was admitted initially with clinical  diagnosis of bronchiectasis flare with concern for community-acquired  pneumonia, as well as systemic inflammatory response syndrome.  During  routine evaluation, cardiac enzymes were obtained which alerted  emergency room physician as initial point-of-care markers demonstrated  CK-MB of 18.1 with a troponin I of less than 0.05.  A 12-lead EKG was  obtained demonstrating diffuse ST changes and therefore, [facility] was consulted.  He was seen initially by Dr. Minus Breeding  who felt these findings were consistent with acute  pericarditis.  An  echocardiogram was obtained on August 06, 2008, demonstrating an  ejection fraction of 60-65% with a small free-flowing echo-free  pericardial effusion which was circumferential to the heart.   HOSPITAL COURSE BY DISCHARGE DIAGNOSES:  1. Bronchiectasis flare in the setting of underlying pulmonary      dysplasia, asthma, and esophageal dysmotility.  Jorge [name] was      admitted initially to the step-down unit.  Treatment consisted of      empiric antibiotics, supplemental oxygen, and inhaled      bronchodilators.  He continued to make slow progress through the      course of his hospitalization with his major obstacle actually      being the diagnosis of pericarditis and resultant chest discomfort      more than clinical diagnosis of pneumonia.  Cultures were obtained      and all were negative.  He continued to improve from a pulmonary      status, upon time of discharge, he is  back to baseline pulmonary      status, he will be discharged to home to complete 10 total days of      Avelox therapy.  He will also be discharged home on p.r.n.      Ventolin, and fix dose prednisone until further evaluation by Dr.      Joya Gaskins.  2. Pericarditis, with concern for autoimmune process in the setting of      elevated rheumatoid factor and elevated ANA and ASO titers.  Mr.      [name] was empirically treated with IV Solu-Medrol, this has since      been tapered to oral prednisone.  From a discomfort standpoint, he      was placed on colchicine by Cardiology.  His EKG has continued to      improve over the course of his hospitalization.  Upon time of      discharge, a followup echocardiogram was done demonstrating a      persistent very small pericardial effusion.  However,      symptomatically, EKG is improving, would recommend followup EKG in      the outpatient setting.  No specific Cardiology consult was      recommended.  There will be consideration for outpatient referral       to Rheumatology in the setting of above-noted lab abnormalities.   DISCHARGE INSTRUCTIONS:  Diet as tolerated.   DISCHARGE MEDICATIONS:  1. Avelox 400 mg 1 tablet daily x4 days.  2. Colchicine 0.6 mg 1 tablet twice daily for 7 more days.  3. Prednisone 10 mg tab, 4 tablets daily until instructed otherwise by      Dr. Joya Gaskins.  4. Ventolin/ProAir 2 puffs every 4 hours as needed for shortness of      breath.   He has follow up with Dr. Joya Gaskins on August 18, 2008, at 4:00 p.m.Marland Kitchen  At  which time, he should have repeat 12-lead EKG, as well as further  consideration for outpatient Rheumatology evaluation.   DISPOSITION:  Jorge [name] has met maximum benefit from inpatient stay.  He  is now medically cleared for discharge to home with follow up as  previously mentioned.      Jorge Dom, NP      Chesley Mires, MD  Electronically Signed    PB/MEDQ  D:  08/11/2008  T:  08/12/2008  Job:  [id number]   cc:   Burnett Harry. Joya Gaskins, MD, FCCP

## 2011-02-24 NOTE — Assessment & Plan Note (Signed)
[facility]                             PULMONARY OFFICE NOTE   Jorge [name], Jorge [name]                      MRN:          [id number]  DATE:11/27/2006                            DOB:          06-Feb-1981    Pulmonary consultation requested by Dr. Delora Fuel.   HISTORY:  This is a 30 year old black male who was told he had  pulmonary dysplasia as a child, and comes in with his first episode of  pleuritic pain 2 weeks ago, seen in the ER, and felt to have  bronchiectasis.  He was treated with a course of antibiotics, and now  feels back to normal.  He did have purulent sputum, low-grade fever,  both of which resolved.  He denies any significant dyspnea with  exertion, orthopnea, fevers, chills, sweats or leg swelling.  He has  been started already on prednisone, and is tapering it down, and using  p.r.n. albuterol sparingly.   PAST MEDICAL HISTORY:  Significant only for asthma.   ALLERGIES:  NONE KNOWN.   SOCIAL HISTORY:  He has never smoked.  He is unemployed.   FAMILY HISTORY:  Recorded in detail on the worksheet.  Negative for  respiratory diseases.   REVIEW OF SYSTEMS:  Taken in detail on the worksheet.  Negative for  additional complaints.  Patient denies any dysphagia.   PHYSICAL EXAMINATION:  This is a stoic, ambulatory, black male, in no  acute distress, who has very little knowledge of his pulmonary  dysplasia.  (He does not recall ever being seen by a specialist.)  HEENT:  Unremarkable.  Oropharynx clear.  LUNG FIELDS:  Reveal a few rhonchi only.  Overall air movement is  adequate.  HEART:  Regular rhythm without murmur, gallop or rub.  ABDOMEN:  Soft and benign.  EXTREMITIES:  Warm without calf tenderness, cyanosis, clubbing or edema.   CT scan of the chest was reviewed from November 21, 2006, indicating  right greater than left bronchiectasis, primarily central in  distribution.  He also has a dilated esophagus with air fluid  levels.   IMPRESSION:  Pulmonary dysplasia in the setting of possible esophageal  dysfunction masquerading as chronic asthma in this patient who has  most likely seen a respiratory specialist before when he was a child,  otherwise it would be difficult for me to understand how he could of  carried this diagnosis.  I think the first step here it to make sure  that the esophagus is functioning normally, and ask his mother to come  with him on his next visit to fill in the blanks as far as what  physicians he has seen and in what medical centers he has been seen for  the purpose of continuity of care, and to shed further light on his  pulmonary dysplasia.   I, therefore, have scheduled a barium swallow and asked him to bring his  mother with him on his next visit to see what more we can learn about  this issue.  In the meantime, taper completely off prednisone to see if  his symptoms increase or he  needs more albuterol.     Christena Deem. Melvyn Novas, MD, [facility]  Electronically Signed    MBW/MedQ  DD: 11/28/2006  DT: 11/28/2006  Job #: [id number]

## 2011-05-01 ENCOUNTER — Emergency Department (HOSPITAL_COMMUNITY): Payer: Medicare Other

## 2011-05-01 ENCOUNTER — Emergency Department (HOSPITAL_COMMUNITY)
Admission: EM | Admit: 2011-05-01 | Discharge: 2011-05-01 | Disposition: A | Discharge status: Home / Self Care | Payer: Medicare Other | Attending: Emergency Medicine | Admitting: Emergency Medicine

## 2011-05-01 DIAGNOSIS — J45909 Unspecified asthma, uncomplicated: Secondary | ICD-10-CM | POA: Insufficient documentation

## 2011-05-01 DIAGNOSIS — R110 Nausea: Secondary | ICD-10-CM | POA: Insufficient documentation

## 2011-05-01 DIAGNOSIS — R079 Chest pain, unspecified: Secondary | ICD-10-CM | POA: Insufficient documentation

## 2011-05-01 DIAGNOSIS — IMO0001 Reserved for inherently not codable concepts without codable children: Secondary | ICD-10-CM | POA: Insufficient documentation

## 2011-05-01 DIAGNOSIS — B9789 Other viral agents as the cause of diseases classified elsewhere: Secondary | ICD-10-CM | POA: Insufficient documentation

## 2011-05-01 LAB — CBC
HCT: 43.2 % (ref 39.0–52.0)
MCHC: 31.7 g/dL (ref 30.0–36.0)
MCV: 80.6 fL (ref 78.0–100.0)
RDW: 13.7 % (ref 11.5–15.5)

## 2011-05-01 LAB — COMPREHENSIVE METABOLIC PANEL
ALT: 38 U/L (ref 0–53)
Alkaline Phosphatase: 61 U/L (ref 39–117)
BUN: 16 mg/dL (ref 6–23)
CO2: 26 mEq/L (ref 19–32)
GFR calc Af Amer: >60 mL/min (ref >60)
GFR calc non Af Amer: >60 mL/min (ref >60)
Glucose, Bld: 84 mg/dL (ref 70–99)
Potassium: 3.9 mEq/L (ref 3.5–5.1)
Sodium: 139 mEq/L (ref 135–145)
Total Bilirubin: 0.3 mg/dL (ref 0.3–1.2)

## 2011-05-01 LAB — DIFFERENTIAL
Eosinophils Relative: 1 % (ref 0–5)
Lymphocytes Relative: 40 % (ref 12–46)
Lymphs Abs: 1.6 10*3/uL (ref 0.7–4.0)
Monocytes Absolute: 0.6 10*3/uL (ref 0.1–1.0)

## 2011-05-01 LAB — URINALYSIS, ROUTINE W REFLEX MICROSCOPIC
Bilirubin Urine: NEGATIVE
Hgb urine dipstick: NEGATIVE
Protein, ur: 100 mg/dL — AB
Urobilinogen, UA: 1 mg/dL (ref 0.0–1.0)

## 2011-05-01 LAB — LIPASE, BLOOD: Lipase: 21 U/L (ref 11–59)

## 2011-05-24 ENCOUNTER — Emergency Department (HOSPITAL_COMMUNITY)
Admission: EM | Admit: 2011-05-24 | Discharge: 2011-05-24 | Disposition: A | Discharge status: Home / Self Care | Payer: Medicare Other | Attending: Emergency Medicine | Admitting: Emergency Medicine

## 2011-05-24 ENCOUNTER — Emergency Department (HOSPITAL_COMMUNITY): Payer: Medicare Other

## 2011-05-24 DIAGNOSIS — M329 Systemic lupus erythematosus, unspecified: Secondary | ICD-10-CM | POA: Insufficient documentation

## 2011-05-24 DIAGNOSIS — R079 Chest pain, unspecified: Secondary | ICD-10-CM | POA: Insufficient documentation

## 2011-06-21 ENCOUNTER — Emergency Department (HOSPITAL_COMMUNITY)
Admission: EM | Admit: 2011-06-21 | Discharge: 2011-06-21 | Disposition: A | Discharge status: Home / Self Care | Payer: Medicare Other | Attending: Emergency Medicine | Admitting: Emergency Medicine

## 2011-06-21 DIAGNOSIS — R369 Urethral discharge, unspecified: Secondary | ICD-10-CM | POA: Insufficient documentation

## 2011-06-21 DIAGNOSIS — N342 Other urethritis: Secondary | ICD-10-CM | POA: Insufficient documentation

## 2011-06-21 LAB — URINALYSIS, ROUTINE W REFLEX MICROSCOPIC
Bilirubin Urine: NEGATIVE
Glucose, UA: NEGATIVE mg/dL
Ketones, ur: NEGATIVE mg/dL
Protein, ur: 100 mg/dL — AB
Urobilinogen, UA: 0.2 mg/dL (ref 0.0–1.0)

## 2011-06-21 LAB — URINE MICROSCOPIC-ADD ON

## 2011-06-22 LAB — GC/CHLAMYDIA PROBE AMP, GENITAL
Chlamydia, DNA Probe: NEGATIVE
GC Probe Amp, Genital: POSITIVE — AB

## 2011-06-29 LAB — COMPREHENSIVE METABOLIC PANEL
ALT: 46
AST: 59 — ABNORMAL HIGH
Alkaline Phosphatase: 83
CO2: 24
Calcium: 9.5
GFR calc Af Amer: >60
GFR calc non Af Amer: >60
Potassium: 4.6
Sodium: 136
Total Protein: 10 — ABNORMAL HIGH

## 2011-06-29 LAB — DIFFERENTIAL
Basophils Relative: 1
Eosinophils Absolute: 0.1
Eosinophils Relative: 2
Lymphs Abs: 1.6
Monocytes Absolute: 0.5

## 2011-06-29 LAB — CBC
Hemoglobin: 12.7 — ABNORMAL LOW
MCHC: 32.5
RBC: 5.34

## 2011-07-10 LAB — BLOOD GAS, ARTERIAL
pCO2 arterial: 44.2
pO2, Arterial: 138 — ABNORMAL HIGH

## 2011-07-10 LAB — POCT I-STAT, CHEM 8
BUN: 10
Calcium, Ion: 1.12
Chloride: 106
Creatinine, Ser: 2.8 — ABNORMAL HIGH
Glucose, Bld: 99
Glucose, Bld: 100 — ABNORMAL HIGH
Hemoglobin: 13.3
Potassium: 4.4
TCO2: 20

## 2011-07-10 LAB — BASIC METABOLIC PANEL
BUN: 32 — ABNORMAL HIGH
BUN: 40 — ABNORMAL HIGH
CO2: 20
Chloride: 105
Chloride: 107
Chloride: 109
Creatinine, Ser: 1.98 — ABNORMAL HIGH
GFR calc non Af Amer: 44 — ABNORMAL LOW
Glucose, Bld: 191 — ABNORMAL HIGH
Glucose, Bld: 85
Glucose, Bld: 93
Potassium: 4.1
Potassium: 5.1
Sodium: 133 — ABNORMAL LOW

## 2011-07-10 LAB — CK TOTAL AND CKMB (NOT AT ARMC): CK, MB: 5.6 — ABNORMAL HIGH

## 2011-07-10 LAB — POCT I-STAT 3, ART BLOOD GAS (G3+)
Acid-base deficit: 3 — ABNORMAL HIGH
Bicarbonate: 22.5
O2 Saturation: 98
Patient temperature: 37
pO2, Arterial: 105 — ABNORMAL HIGH

## 2011-07-10 LAB — DIFFERENTIAL
Eosinophils Absolute: 0.1
Eosinophils Relative: 1
Lymphs Abs: 2
Monocytes Absolute: 1
Monocytes Relative: 10

## 2011-07-10 LAB — CBC
HCT: 37.6 — ABNORMAL LOW
Hemoglobin: 12.3 — ABNORMAL LOW
MCHC: 32.2
MCHC: 32.3
MCV: 75.5 — ABNORMAL LOW
MCV: 75.9 — ABNORMAL LOW
MCV: 76.7 — ABNORMAL LOW
Platelets: 276
Platelets: 286
RBC: 4.98
RDW: 16.9 — ABNORMAL HIGH
WBC: 5.2
WBC: 9.4

## 2011-07-10 LAB — SEDIMENTATION RATE: Sed Rate: 96 — ABNORMAL HIGH

## 2011-07-10 LAB — URINALYSIS, ROUTINE W REFLEX MICROSCOPIC
Ketones, ur: NEGATIVE
Leukocytes, UA: NEGATIVE
Nitrite: NEGATIVE
Protein, ur: 100 — AB
Urobilinogen, UA: 1

## 2011-07-10 LAB — INFLUENZA A H1N1: Swine Influenza H1 Gene: NOT DETECTED

## 2011-07-10 LAB — CARDIAC PANEL(CRET KIN+CKTOT+MB+TROPI)
CK, MB: 8.4 — ABNORMAL HIGH
CK, MB: 9.3 — ABNORMAL HIGH
Relative Index: 2.7 — ABNORMAL HIGH
Relative Index: 3.2 — ABNORMAL HIGH
Total CK: 244 — ABNORMAL HIGH
Troponin I: 0.53

## 2011-07-10 LAB — ANA: Anti Nuclear Antibody(ANA): POSITIVE — AB

## 2011-07-10 LAB — URINE MICROSCOPIC-ADD ON

## 2011-07-10 LAB — COMPREHENSIVE METABOLIC PANEL
AST: 30
Albumin: 2.2 — ABNORMAL LOW
CO2: 22
Calcium: 8.6
Creatinine, Ser: 1.91 — ABNORMAL HIGH
GFR calc Af Amer: 52 — ABNORMAL LOW
GFR calc non Af Amer: 43 — ABNORMAL LOW

## 2011-07-10 LAB — RAPID URINE DRUG SCREEN, HOSP PERFORMED
Benzodiazepines: POSITIVE — AB
Cocaine: NOT DETECTED
Tetrahydrocannabinol: NOT DETECTED

## 2011-07-11 LAB — POCT CARDIAC MARKERS
CKMB, poc: 8.2
Troponin i, poc: <0.05

## 2011-07-11 LAB — BASIC METABOLIC PANEL
BUN: 15
BUN: 15
BUN: 19
BUN: 13
BUN: 17
CO2: 19
CO2: 22
CO2: 28
CO2: 28
Calcium: 8.5
Calcium: 9.1
Calcium: 8.8
Calcium: 8.9
Chloride: 104
Chloride: 105
Chloride: 113 — ABNORMAL HIGH
Chloride: 113 — ABNORMAL HIGH
Chloride: 103
Creatinine, Ser: 0.77
Creatinine, Ser: 0.95
Creatinine, Ser: 0.86
GFR calc Af Amer: >60
GFR calc Af Amer: >60
GFR calc non Af Amer: >60
GFR calc non Af Amer: >60
GFR calc non Af Amer: >60
Glucose, Bld: 117 — ABNORMAL HIGH
Glucose, Bld: 94
Glucose, Bld: 98
Potassium: 4.4
Potassium: 5.2 — ABNORMAL HIGH
Potassium: 4.1
Potassium: 4.2
Sodium: 139
Sodium: 135

## 2011-07-11 LAB — URINALYSIS, MICROSCOPIC ONLY
Bilirubin Urine: NEGATIVE
Glucose, UA: NEGATIVE
Hgb urine dipstick: NEGATIVE
Protein, ur: 100 — AB

## 2011-07-11 LAB — CBC
HCT: 37.1 — ABNORMAL LOW
HCT: 33.1 — ABNORMAL LOW
HCT: 36.3 — ABNORMAL LOW
Hemoglobin: 11.8 — ABNORMAL LOW
Hemoglobin: 11.8 — ABNORMAL LOW
MCHC: 31.4
MCHC: 31.8
MCHC: 32.6
MCV: 75.6 — ABNORMAL LOW
MCV: 77.1 — ABNORMAL LOW
MCV: 77.3 — ABNORMAL LOW
MCV: 76.9 — ABNORMAL LOW
Platelets: 308
Platelets: 347
Platelets: 256
Platelets: 262
RBC: 4.8
RBC: 4.55
RBC: 4.73
RDW: 17.6 — ABNORMAL HIGH
RDW: 18.5 — ABNORMAL HIGH
WBC: 9.4
WBC: 11.8 — ABNORMAL HIGH
WBC: 7.5
WBC: 8

## 2011-07-11 LAB — DIFFERENTIAL
Basophils Absolute: 0
Basophils Relative: 2 — ABNORMAL HIGH
Eosinophils Absolute: 0
Eosinophils Relative: 0
Eosinophils Relative: 0
Lymphocytes Relative: 12
Lymphocytes Relative: 9 — ABNORMAL LOW
Lymphs Abs: 0.9
Monocytes Relative: 6
Neutro Abs: 6.3
Neutrophils Relative %: 79 — ABNORMAL HIGH
Neutrophils Relative %: 83 — ABNORMAL HIGH

## 2011-07-11 LAB — SEDIMENTATION RATE: Sed Rate: 74 — ABNORMAL HIGH

## 2011-07-11 LAB — ANTI-RIBONUCLEIC ACID ANTIBODY: Ribonucleic Protein(ENA) Antibody, IgG: >8 AI — ABNORMAL HIGH (ref <1.0)

## 2011-07-11 LAB — ANTI-SMITH ANTIBODY: ENA SM Ab Ser-aCnc: >8 AI — ABNORMAL HIGH (ref <1.0)

## 2011-07-11 LAB — RETICULOCYTES: Retic Ct Pct: 0.7

## 2011-07-11 LAB — IRON AND TIBC
Saturation Ratios: 45
TIBC: 186 — ABNORMAL LOW
UIBC: 103

## 2011-07-11 LAB — SJOGRENS SYNDROME-A EXTRACTABLE NUCLEAR ANTIBODY: SSA (Ro) (ENA) Antibody, IgG: 0.5 AI (ref <1.0)

## 2011-07-11 LAB — CULTURE, BLOOD (ROUTINE X 2): Culture: NO GROWTH

## 2011-07-11 LAB — CLOSTRIDIUM DIFFICILE EIA: C difficile Toxins A+B, EIA: NEGATIVE

## 2011-07-11 LAB — FOLATE: Folate: 11

## 2011-07-26 ENCOUNTER — Encounter: Payer: Self-pay | Admitting: *Deleted

## 2011-07-26 ENCOUNTER — Emergency Department (HOSPITAL_BASED_OUTPATIENT_CLINIC_OR_DEPARTMENT_OTHER)
Admission: EM | Admit: 2011-07-26 | Discharge: 2011-07-26 | Disposition: A | Discharge status: Home / Self Care | Payer: Medicare Other | Attending: Emergency Medicine | Admitting: Emergency Medicine

## 2011-07-26 ENCOUNTER — Other Ambulatory Visit: Payer: Self-pay

## 2011-07-26 ENCOUNTER — Emergency Department (INDEPENDENT_AMBULATORY_CARE_PROVIDER_SITE_OTHER): Payer: Medicare Other

## 2011-07-26 DIAGNOSIS — R0789 Other chest pain: Secondary | ICD-10-CM

## 2011-07-26 DIAGNOSIS — R079 Chest pain, unspecified: Secondary | ICD-10-CM | POA: Insufficient documentation

## 2011-07-26 DIAGNOSIS — R0602 Shortness of breath: Secondary | ICD-10-CM | POA: Insufficient documentation

## 2011-07-26 HISTORY — DX: Reserved for concepts with insufficient information to code with codable children: IMO0002

## 2011-07-26 HISTORY — DX: Systemic lupus erythematosus, unspecified: M32.9

## 2011-07-26 LAB — DIFFERENTIAL
Basophils Absolute: 0 10*3/uL (ref 0.0–0.1)
Basophils Relative: 0 % (ref 0–1)
Lymphocytes Relative: 44 % (ref 12–46)
Neutro Abs: 1.1 10*3/uL — ABNORMAL LOW (ref 1.7–7.7)
Neutrophils Relative %: 38 % — ABNORMAL LOW (ref 43–77)

## 2011-07-26 LAB — CARDIAC PANEL(CRET KIN+CKTOT+MB+TROPI)
CK, MB: 5 ng/mL — ABNORMAL HIGH (ref 0.3–4.0)
Relative Index: 1.5 (ref 0.0–2.5)
Total CK: 331 U/L — ABNORMAL HIGH (ref 7–232)
Troponin I: <0.3 ng/mL (ref <0.30)

## 2011-07-26 LAB — CBC
MCHC: 32.3 g/dL (ref 30.0–36.0)
RDW: 14.1 % (ref 11.5–15.5)
WBC: 2.8 10*3/uL — ABNORMAL LOW (ref 4.0–10.5)

## 2011-07-26 LAB — COMPREHENSIVE METABOLIC PANEL
Albumin: 3.4 g/dL — ABNORMAL LOW (ref 3.5–5.2)
BUN: 8 mg/dL (ref 6–23)
Calcium: 8.7 mg/dL (ref 8.4–10.5)
Creatinine, Ser: 0.8 mg/dL (ref 0.50–1.35)
Total Bilirubin: 0.2 mg/dL — ABNORMAL LOW (ref 0.3–1.2)
Total Protein: 7.7 g/dL (ref 6.0–8.3)

## 2011-07-26 MED ORDER — ONDANSETRON HCL 4 MG/2ML IJ SOLN
4.0000 mg | Freq: Once | INTRAMUSCULAR | Status: AC
  Administered 2011-07-26: 4 mg via INTRAVENOUS
  Filled 2011-07-26: qty 2

## 2011-07-26 MED ORDER — MORPHINE SULFATE 4 MG/ML IJ SOLN
4.0000 mg | Freq: Once | INTRAMUSCULAR | Status: AC
  Administered 2011-07-26: 4 mg via INTRAVENOUS
  Filled 2011-07-26: qty 1

## 2011-07-26 NOTE — ED Notes (Signed)
Pt c/o right chest wall pain . States " my lupus is flaring up"

## 2011-07-26 NOTE — ED Provider Notes (Signed)
History     CSN: [id number] Arrival date & time: 07/26/2011  2:03 PM   First MD Initiated Contact with Patient 07/26/11 1438      Chief Complaint  Patient presents with  . Chest Pain    (Consider location/radiation/quality/duration/timing/severity/associated sxs/prior treatment) Patient is a 30 y.o. male presenting with chest pain. The history is provided by the patient. No language interpreter was used.  Chest Pain The chest pain began yesterday. Chest pain occurs intermittently. The chest pain is unchanged. The pain is associated with breathing and coughing. The quality of the pain is described as pleuritic. The pain does not radiate. Chest pain is worsened by certain positions and deep breathing. Primary symptoms include shortness of breath. Pertinent negatives for primary symptoms include no fever, no syncope, no cough, no palpitations, no nausea and no vomiting.  Pertinent negatives for associated symptoms include no claudication and no diaphoresis. He tried nothing for the symptoms. Risk factors include male gender.     Past Medical History  Diagnosis Date  . Lupus     Past Surgical History  Procedure Date  . Tonsillectomy     History reviewed. No pertinent family history.  History  Substance Use Topics  . Smoking status: Never Smoker   . Smokeless tobacco: Not on file  . Alcohol Use: No      Review of Systems  Constitutional: Negative for fever and diaphoresis.  Respiratory: Positive for shortness of breath. Negative for cough.   Cardiovascular: Positive for chest pain. Negative for palpitations, claudication and syncope.  Gastrointestinal: Negative for nausea and vomiting.  All other systems reviewed and are negative.    Allergies  Review of patient's allergies indicates no known allergies.  Home Medications   Current Outpatient Rx  Name Route Sig Dispense Refill  . PREDNISONE 10 MG PO TABS Oral Take 10 mg by mouth 2 (two) times daily.        BP  136/79  Pulse 92  Temp(Src) 98.3 F (36.8 C) (Oral)  Resp 16  Ht 5\' 7"  (1.702 m)  Wt 155 lb (70.308 kg)  BMI 24.28 kg/m2  SpO2 100%  Physical Exam  Nursing note and vitals reviewed. Constitutional: He is oriented to person, place, and time. He appears well-developed and well-nourished.  HENT:  Head: Normocephalic.  Eyes: Pupils are equal, round, and reactive to light.  Neck: Normal range of motion.  Cardiovascular: Normal rate and regular rhythm.   Pulmonary/Chest: Stridor present. He has rales.       Pt tender on the right lower lateral ribs  Abdominal: Soft.  Musculoskeletal: Normal range of motion.  Neurological: He is alert and oriented to person, place, and time.  Skin: Skin is dry.    ED Course  Procedures (including critical care time)  Labs Reviewed  CBC - Abnormal; Notable for the following:    WBC 2.8 (*)    MCH 25.7 (*)    All other components within normal limits  DIFFERENTIAL - Abnormal; Notable for the following:    Neutrophils Relative 38 (*)    Neutro Abs 1.1 (*)    Monocytes Relative 14 (*)    All other components within normal limits  CARDIAC PANEL(CRET KIN+CKTOT+MB+TROPI) - Abnormal; Notable for the following:    Total CK 331 (*)    CK, MB 5.0 (*)    All other components within normal limits  COMPREHENSIVE METABOLIC PANEL - Abnormal; Notable for the following:    Albumin 3.4 (*)    Total Bilirubin  0.2 (*)    All other components within normal limits  D-DIMER, QUANTITATIVE   Dg Chest 2 View  07/26/2011  *RADIOLOGY REPORT*  Clinical Data: Right-sided chest pain, history of lupus  CHEST - 2 VIEW  Comparison: Chest x-ray of 05/24/2011  Findings: Slightly prominent interstitial markings appear chronic. No active infiltrate or effusion is seen.  There is mild peribronchial thickening present.  The heart is within normal limits in size.  No bony abnormality is seen.  IMPRESSION: Stable chronic change.  No active lung disease.  Original Report  Authenticated By: Joretta Bachelor, M.D.     Date: 07/26/2011  Rate: 81  Rhythm: normal sinus rhythm  QRS Axis: normal  Intervals: normal  ST/T Wave abnormalities: normal  Conduction Disutrbances:none  Narrative Interpretation:   Old EKG Reviewed: unchanged   1. Chest pain       MDM  Pt has history of similar symptoms:pt is cp free at this time:d dimer is negative suggesting pe unlikely:pt ekg shows nothing acute        Glendell Docker, NP 07/26/11 1647  Medical screening examination/treatment/procedure(s) were performed by non-physician practitioner and as supervising physician I was immediately available for consultation/collaboration.   Blanchie Dessert, MD 07/29/11 [phone]

## 2011-08-22 ENCOUNTER — Encounter (HOSPITAL_COMMUNITY): Payer: Self-pay | Admitting: *Deleted

## 2011-08-22 ENCOUNTER — Emergency Department (HOSPITAL_COMMUNITY)
Admission: EM | Admit: 2011-08-22 | Discharge: 2011-08-22 | Discharge status: Against Medical Advice | Payer: Medicare Other | Attending: Emergency Medicine | Admitting: Emergency Medicine

## 2011-08-22 DIAGNOSIS — IMO0001 Reserved for inherently not codable concepts without codable children: Secondary | ICD-10-CM | POA: Insufficient documentation

## 2011-08-22 NOTE — ED Notes (Signed)
Generalized Pain since this morning. Generalized lupus pain with anxiety.

## 2011-08-22 NOTE — ED Notes (Signed)
Pt states is experiencing "lupus flare".  States hurts all over.

## 2011-10-20 ENCOUNTER — Encounter (HOSPITAL_COMMUNITY): Payer: Self-pay | Admitting: Emergency Medicine

## 2011-10-20 ENCOUNTER — Other Ambulatory Visit: Payer: Self-pay

## 2011-10-20 ENCOUNTER — Emergency Department (HOSPITAL_COMMUNITY)
Admission: EM | Admit: 2011-10-20 | Discharge: 2011-10-20 | Disposition: A | Discharge status: Home / Self Care | Payer: Medicare Other | Attending: Emergency Medicine | Admitting: Emergency Medicine

## 2011-10-20 ENCOUNTER — Emergency Department (HOSPITAL_COMMUNITY): Payer: Medicare Other

## 2011-10-20 DIAGNOSIS — R071 Chest pain on breathing: Secondary | ICD-10-CM | POA: Diagnosis not present

## 2011-10-20 DIAGNOSIS — Z79899 Other long term (current) drug therapy: Secondary | ICD-10-CM | POA: Diagnosis not present

## 2011-10-20 DIAGNOSIS — J45909 Unspecified asthma, uncomplicated: Secondary | ICD-10-CM | POA: Insufficient documentation

## 2011-10-20 DIAGNOSIS — M329 Systemic lupus erythematosus, unspecified: Secondary | ICD-10-CM | POA: Diagnosis not present

## 2011-10-20 DIAGNOSIS — R918 Other nonspecific abnormal finding of lung field: Secondary | ICD-10-CM | POA: Diagnosis not present

## 2011-10-20 DIAGNOSIS — R079 Chest pain, unspecified: Secondary | ICD-10-CM | POA: Diagnosis not present

## 2011-10-20 DIAGNOSIS — J984 Other disorders of lung: Secondary | ICD-10-CM | POA: Diagnosis not present

## 2011-10-20 LAB — BASIC METABOLIC PANEL
BUN: 11 mg/dL (ref 6–23)
Chloride: 104 mEq/L (ref 96–112)
Creatinine, Ser: 1.06 mg/dL (ref 0.50–1.35)
GFR calc non Af Amer: >90 mL/min (ref >90)
Glucose, Bld: 86 mg/dL (ref 70–99)
Potassium: 3.4 mEq/L — ABNORMAL LOW (ref 3.5–5.1)

## 2011-10-20 LAB — RAPID URINE DRUG SCREEN, HOSP PERFORMED
Amphetamines: NOT DETECTED
Barbiturates: NOT DETECTED
Benzodiazepines: NOT DETECTED

## 2011-10-20 MED ORDER — METHYLPREDNISOLONE SODIUM SUCC 125 MG IJ SOLR
125.0000 mg | Freq: Once | INTRAMUSCULAR | Status: AC
  Administered 2011-10-20: 125 mg via INTRAVENOUS
  Filled 2011-10-20: qty 2

## 2011-10-20 MED ORDER — IBUPROFEN 800 MG PO TABS
800.0000 mg | ORAL_TABLET | Freq: Once | ORAL | Status: AC
  Administered 2011-10-20: 800 mg via ORAL
  Filled 2011-10-20: qty 1

## 2011-10-20 MED ORDER — KETOROLAC TROMETHAMINE 30 MG/ML IJ SOLN
30.0000 mg | Freq: Once | INTRAMUSCULAR | Status: AC
  Administered 2011-10-20: 30 mg via INTRAVENOUS
  Filled 2011-10-20: qty 1

## 2011-10-20 MED ORDER — IBUPROFEN 800 MG PO TABS: 800.0000 mg | ORAL_TABLET | Freq: Three times a day (TID) | ORAL | Status: AC | PRN

## 2011-10-20 MED ORDER — HYDROMORPHONE HCL PF 1 MG/ML IJ SOLN
1.0000 mg | Freq: Once | INTRAMUSCULAR | Status: AC
  Administered 2011-10-20: 1 mg via INTRAVENOUS
  Filled 2011-10-20: qty 1

## 2011-10-20 MED ORDER — HYDROCODONE-ACETAMINOPHEN 5-325 MG PO TABS: 2.0000 | ORAL_TABLET | ORAL | Status: AC | PRN

## 2011-10-20 NOTE — ED Notes (Signed)
Patient very dramatic especially when someone is watching him.

## 2011-10-20 NOTE — ED Notes (Signed)
Friend at bs

## 2011-10-20 NOTE — ED Notes (Signed)
Pt states that CP started 2 days ago, had once more the same/similar s/s, pt showing the pain is located to the right chest, non radiating pain, pt also describes pain as a stabbing and constant, increased with inhalation, repots no sleep in the last 2 days. Pt denies taking anything for pain at home.

## 2011-10-20 NOTE — ED Provider Notes (Signed)
History     CSN: [id number]  Arrival date & time 10/20/11  0155   First MD Initiated Contact with Patient 10/20/11 0241      Chief Complaint  Patient presents with  . Chest Pain     HPI  History provided by the patient. Patient is a 31 year old male with past history of lupus, asthma who presents with complaints of right-sided chest pain that began 2 days ago. Patient has similar symptoms previously with a couple visits to the emergency room this for the same complaint in the past several months. Today patient states that pain is located to the anterior chest and is intermittently sharp and stabbing. Pain is made worse with some inspirations and deep breaths. Patient states that pain has kept him from sleeping very well in the past 2 nights. Patient has not tried taking any medications for his symptoms at home. Patient denies any other aggravating or alleviating factors. He denies palpitations, hemoptysis, shortness of breath or near-syncope. Patient denies any recent fever, chills, sweats, nausea, vomiting or diarrhea.    Past Medical History  Diagnosis Date  . Lupus   . Asthma   . Bronchopulmonary dysplasia     Past Surgical History  Procedure Date  . Tonsillectomy   . Wisdom tooth extraction     History reviewed. No pertinent family history.  History  Substance Use Topics  . Smoking status: Never Smoker   . Smokeless tobacco: Not on file  . Alcohol Use: No      Review of Systems  Constitutional: Negative for fever and chills.  Respiratory: Negative for cough and shortness of breath.   Cardiovascular: Positive for chest pain. Negative for palpitations.  Gastrointestinal: Negative for nausea and vomiting.  All other systems reviewed and are negative.    Allergies  Review of patient's allergies indicates no known allergies.  Home Medications   Current Outpatient Rx  Name Route Sig Dispense Refill  . COLCHICINE 0.6 MG PO TABS Oral Take 0.6 mg by mouth daily.     Marland Kitchen HYDROXYCHLOROQUINE SULFATE 200 MG PO TABS Oral Take 400 mg by mouth daily.    Marland Kitchen PREDNISONE 10 MG PO TABS Oral Take 10 mg by mouth 2 (two) times daily.        BP 155/104  Pulse 87  Temp(Src) 98.6 F (37 C) (Oral)  Resp 37  Ht 5\' 8"  (1.727 m)  Wt 158 lb (71.668 kg)  BMI 24.02 kg/m2  SpO2 99%  Physical Exam  Nursing note and vitals reviewed. Constitutional: He is oriented to person, place, and time. He appears well-developed and well-nourished.  HENT:  Head: Normocephalic and atraumatic.  Cardiovascular: Normal rate and regular rhythm.   Pulmonary/Chest: Effort normal and breath sounds normal. No respiratory distress. He has no wheezes. He has no rales. He exhibits tenderness.  Abdominal: Soft. He exhibits no distension. There is no tenderness. There is no rebound and no guarding.  Neurological: He is alert and oriented to person, place, and time.  Skin: Skin is warm. No rash noted.  Psychiatric: He has a normal mood and affect. Thought content normal.    ED Course  Procedures (including critical care time)  Labs Reviewed  BASIC METABOLIC PANEL - Abnormal; Notable for the following:    Potassium 3.4 (*)    All other components within normal limits  DRUG SCREEN PANEL (SERUM)  TROPONIN I   Results for orders placed during the hospital encounter of 10/20/11  TROPONIN I  Component Value Range   Troponin I <0.30  <0.30 (ng/mL)  BASIC METABOLIC PANEL      Component Value Range   Sodium 140  135 - 145 (mEq/L)   Potassium 3.4 (*) 3.5 - 5.1 (mEq/L)   Chloride 104  96 - 112 (mEq/L)   CO2 27  19 - 32 (mEq/L)   Glucose, Bld 86  70 - 99 (mg/dL)   BUN 11  6 - 23 (mg/dL)   Creatinine, Ser 1.06  0.50 - 1.35 (mg/dL)   Calcium 8.9  8.4 - 10.5 (mg/dL)   GFR calc non Af Amer >90  >90 (mL/min)   GFR calc Af Amer >90  >90 (mL/min)  URINE RAPID DRUG SCREEN (HOSP PERFORMED)      Component Value Range   Opiates NONE DETECTED  NONE DETECTED    Cocaine NONE DETECTED  NONE DETECTED     Benzodiazepines NONE DETECTED  NONE DETECTED    Amphetamines NONE DETECTED  NONE DETECTED    Tetrahydrocannabinol POSITIVE (*) NONE DETECTED    Barbiturates NONE DETECTED  NONE DETECTED     Dg Chest 2 View  10/20/2011  *RADIOLOGY REPORT*  Clinical Data: Chest pain.  CHEST - 2 VIEW  Comparison: 07/26/2011.  Findings: Chronic pulmonary parenchymal scarring and interstitial opacity is present which appears similar to the prior exam.  No airspace disease.  No effusion.  Cardiopericardial silhouette is within normal limits.  IMPRESSION: No acute cardiopulmonary disease or interval change.  Chronic pulmonary parenchymal scarring and interstitial changes.  Original Report Authenticated By: Dereck Ligas, M.D.     1. Chest pain       MDM  4:30 AM patient seen and evaluated. Patient in no acute distress.  Pt is PERC negative.  Review of past visits reveals similar presentations. Patient has had negative workups each time.  Patient having some relief after medicines. Patient has normal workup without any concerning findings. At this time will refer patient back to PCP.     Date: 10/20/2011  Rate: 88  Rhythm: normal sinus rhythm  QRS Axis: normal  Intervals: normal  ST/T Wave abnormalities: normal  Conduction Disutrbances:none  Narrative Interpretation:   Old EKG Reviewed: unchanged from 07/26/2011      Martie Lee, PA 10/21/11 0130

## 2011-10-20 NOTE — ED Notes (Signed)
Patient sts that he is better

## 2011-10-20 NOTE — ED Notes (Signed)
Per EMS: pt c/o chest pain, started 3 days ago, 10/10, denies any other discomfort. Pt states that he has Hx of CP.

## 2011-10-22 NOTE — ED Provider Notes (Signed)
Medical screening examination/treatment/procedure(s) were performed by non-physician practitioner and as supervising physician I was immediately available for consultation/collaboration.   Wynetta Fines, MD 10/22/11 [phone]

## 2012-01-20 ENCOUNTER — Emergency Department (HOSPITAL_COMMUNITY): Payer: Medicare Other

## 2012-01-20 ENCOUNTER — Encounter (HOSPITAL_COMMUNITY): Payer: Self-pay | Admitting: *Deleted

## 2012-01-20 ENCOUNTER — Inpatient Hospital Stay (HOSPITAL_COMMUNITY)
Admission: EM | Admit: 2012-01-20 | Discharge: 2012-01-22 | DRG: 195 | Disposition: A | Discharge status: Home / Self Care | Payer: Medicare Other | Attending: Internal Medicine | Admitting: Internal Medicine

## 2012-01-20 DIAGNOSIS — R079 Chest pain, unspecified: Secondary | ICD-10-CM | POA: Diagnosis not present

## 2012-01-20 DIAGNOSIS — J479 Bronchiectasis, uncomplicated: Secondary | ICD-10-CM | POA: Diagnosis present

## 2012-01-20 DIAGNOSIS — R0902 Hypoxemia: Secondary | ICD-10-CM | POA: Diagnosis not present

## 2012-01-20 DIAGNOSIS — R030 Elevated blood-pressure reading, without diagnosis of hypertension: Secondary | ICD-10-CM | POA: Diagnosis present

## 2012-01-20 DIAGNOSIS — IMO0002 Reserved for concepts with insufficient information to code with codable children: Secondary | ICD-10-CM | POA: Diagnosis present

## 2012-01-20 DIAGNOSIS — R042 Hemoptysis: Secondary | ICD-10-CM | POA: Diagnosis not present

## 2012-01-20 DIAGNOSIS — J45909 Unspecified asthma, uncomplicated: Secondary | ICD-10-CM

## 2012-01-20 DIAGNOSIS — M329 Systemic lupus erythematosus, unspecified: Secondary | ICD-10-CM | POA: Diagnosis not present

## 2012-01-20 DIAGNOSIS — R918 Other nonspecific abnormal finding of lung field: Secondary | ICD-10-CM | POA: Diagnosis not present

## 2012-01-20 DIAGNOSIS — J189 Pneumonia, unspecified organism: Secondary | ICD-10-CM

## 2012-01-20 DIAGNOSIS — I10 Essential (primary) hypertension: Secondary | ICD-10-CM

## 2012-01-20 DIAGNOSIS — R911 Solitary pulmonary nodule: Secondary | ICD-10-CM | POA: Diagnosis not present

## 2012-01-20 DIAGNOSIS — R071 Chest pain on breathing: Secondary | ICD-10-CM | POA: Diagnosis not present

## 2012-01-20 DIAGNOSIS — R0489 Hemorrhage from other sites in respiratory passages: Secondary | ICD-10-CM

## 2012-01-20 DIAGNOSIS — R0781 Pleurodynia: Secondary | ICD-10-CM

## 2012-01-20 DIAGNOSIS — R0989 Other specified symptoms and signs involving the circulatory and respiratory systems: Secondary | ICD-10-CM | POA: Diagnosis not present

## 2012-01-20 LAB — HEPATIC FUNCTION PANEL
ALT: 25 U/L (ref 0–53)
AST: 35 U/L (ref 0–37)
Alkaline Phosphatase: 67 U/L (ref 39–117)
Bilirubin, Direct: <0.1 mg/dL (ref 0.0–0.3)
Total Protein: 7.8 g/dL (ref 6.0–8.3)

## 2012-01-20 LAB — CBC
Platelets: 266 10*3/uL (ref 150–400)
RDW: 15.3 % (ref 11.5–15.5)
WBC: 8.1 10*3/uL (ref 4.0–10.5)

## 2012-01-20 LAB — BASIC METABOLIC PANEL
CO2: 24 mEq/L (ref 19–32)
Chloride: 106 mEq/L (ref 96–112)
Potassium: 4.2 mEq/L (ref 3.5–5.1)
Sodium: 139 mEq/L (ref 135–145)

## 2012-01-20 LAB — DIFFERENTIAL
Basophils Absolute: 0 10*3/uL (ref 0.0–0.1)
Lymphocytes Relative: 24 % (ref 12–46)
Neutro Abs: 4.8 10*3/uL (ref 1.7–7.7)

## 2012-01-20 MED ORDER — PREDNISONE 20 MG PO TABS
ORAL_TABLET | ORAL | Status: AC
  Administered 2012-01-20: 40 mg via ORAL
  Filled 2012-01-20: qty 2

## 2012-01-20 MED ORDER — ALBUTEROL SULFATE (5 MG/ML) 0.5% IN NEBU
5.0000 mg | INHALATION_SOLUTION | Freq: Once | RESPIRATORY_TRACT | Status: AC
  Administered 2012-01-20: 5 mg via RESPIRATORY_TRACT
  Filled 2012-01-20: qty 0.5

## 2012-01-20 MED ORDER — SODIUM CHLORIDE 0.9 % IV SOLN
Freq: Once | INTRAVENOUS | Status: AC
  Administered 2012-01-20: 11:00:00 via INTRAVENOUS

## 2012-01-20 MED ORDER — DEXTROSE 5 % IV SOLN
500.0000 mg | Freq: Once | INTRAVENOUS | Status: AC
  Administered 2012-01-20: 500 mg via INTRAVENOUS
  Filled 2012-01-20: qty 500

## 2012-01-20 MED ORDER — ALBUTEROL SULFATE (5 MG/ML) 0.5% IN NEBU: 2.5000 mg | INHALATION_SOLUTION | RESPIRATORY_TRACT | Status: DC | PRN

## 2012-01-20 MED ORDER — HYDROMORPHONE HCL PF 1 MG/ML IJ SOLN: 1.0000 mg | INTRAMUSCULAR | Status: DC | PRN

## 2012-01-20 MED ORDER — ALBUTEROL SULFATE (5 MG/ML) 0.5% IN NEBU
5.0000 mg | INHALATION_SOLUTION | RESPIRATORY_TRACT | Status: DC | PRN
  Administered 2012-01-20: 5 mg via RESPIRATORY_TRACT

## 2012-01-20 MED ORDER — IPRATROPIUM BROMIDE 0.02 % IN SOLN
0.5000 mg | Freq: Once | RESPIRATORY_TRACT | Status: AC
  Administered 2012-01-20: 0.5 mg via RESPIRATORY_TRACT
  Filled 2012-01-20: qty 2.5

## 2012-01-20 MED ORDER — MORPHINE SULFATE 4 MG/ML IJ SOLN
4.0000 mg | Freq: Once | INTRAMUSCULAR | Status: AC
  Administered 2012-01-20: 4 mg via INTRAVENOUS
  Filled 2012-01-20: qty 1

## 2012-01-20 MED ORDER — GUAIFENESIN ER 600 MG PO TB12
600.0000 mg | ORAL_TABLET | Freq: Two times a day (BID) | ORAL | Status: DC
  Administered 2012-01-20 – 2012-01-22 (×4): 600 mg via ORAL
  Filled 2012-01-20 (×5): qty 1

## 2012-01-20 MED ORDER — ENOXAPARIN SODIUM 40 MG/0.4ML ~~LOC~~ SOLN
40.0000 mg | SUBCUTANEOUS | Status: DC
  Administered 2012-01-20 – 2012-01-21 (×2): 40 mg via SUBCUTANEOUS
  Filled 2012-01-20 (×3): qty 0.4

## 2012-01-20 MED ORDER — ONDANSETRON HCL 4 MG/2ML IJ SOLN: 4.0000 mg | Freq: Four times a day (QID) | INTRAMUSCULAR | Status: DC | PRN

## 2012-01-20 MED ORDER — IBUPROFEN 800 MG PO TABS
800.0000 mg | ORAL_TABLET | Freq: Three times a day (TID) | ORAL | Status: DC
  Administered 2012-01-20 – 2012-01-22 (×4): 800 mg via ORAL
  Filled 2012-01-20 (×7): qty 1

## 2012-01-20 MED ORDER — SODIUM CHLORIDE 0.9 % IJ SOLN
3.0000 mL | Freq: Two times a day (BID) | INTRAMUSCULAR | Status: DC
  Administered 2012-01-21 – 2012-01-22 (×3): 3 mL via INTRAVENOUS

## 2012-01-20 MED ORDER — SODIUM CHLORIDE 0.9 % IJ SOLN: 3.0000 mL | INTRAMUSCULAR | Status: DC | PRN

## 2012-01-20 MED ORDER — ONDANSETRON HCL 4 MG PO TABS: 4.0000 mg | ORAL_TABLET | Freq: Four times a day (QID) | ORAL | Status: DC | PRN

## 2012-01-20 MED ORDER — DEXTROSE 5 % IV SOLN
1.0000 g | INTRAVENOUS | Status: DC
  Administered 2012-01-21 – 2012-01-22 (×2): 1 g via INTRAVENOUS
  Filled 2012-01-20 (×2): qty 10

## 2012-01-20 MED ORDER — ONDANSETRON HCL 4 MG/2ML IJ SOLN
4.0000 mg | Freq: Three times a day (TID) | INTRAMUSCULAR | Status: DC | PRN
  Administered 2012-01-20: 4 mg via INTRAVENOUS
  Filled 2012-01-20: qty 2

## 2012-01-20 MED ORDER — PREDNISONE 20 MG PO TABS
60.0000 mg | ORAL_TABLET | Freq: Once | ORAL | Status: AC
  Administered 2012-01-20: 60 mg via ORAL
  Filled 2012-01-20: qty 1

## 2012-01-20 MED ORDER — DEXTROSE 5 % IV SOLN
500.0000 mg | INTRAVENOUS | Status: DC
  Administered 2012-01-21: 500 mg via INTRAVENOUS
  Filled 2012-01-20 (×2): qty 500

## 2012-01-20 MED ORDER — IOHEXOL 300 MG/ML  SOLN
100.0000 mL | Freq: Once | INTRAMUSCULAR | Status: AC | PRN
  Administered 2012-01-20: 100 mL via INTRAVENOUS

## 2012-01-20 MED ORDER — ONDANSETRON HCL 4 MG/2ML IJ SOLN
4.0000 mg | Freq: Once | INTRAMUSCULAR | Status: AC
  Administered 2012-01-20: 4 mg via INTRAVENOUS

## 2012-01-20 MED ORDER — KETOROLAC TROMETHAMINE 30 MG/ML IJ SOLN
30.0000 mg | Freq: Once | INTRAMUSCULAR | Status: AC
  Administered 2012-01-20: 30 mg via INTRAVENOUS
  Filled 2012-01-20: qty 1

## 2012-01-20 MED ORDER — ACETAMINOPHEN 650 MG RE SUPP: 650.0000 mg | Freq: Four times a day (QID) | RECTAL | Status: DC | PRN

## 2012-01-20 MED ORDER — HYDROMORPHONE HCL PF 1 MG/ML IJ SOLN
1.0000 mg | Freq: Once | INTRAMUSCULAR | Status: AC
  Administered 2012-01-20: 1 mg via INTRAVENOUS

## 2012-01-20 MED ORDER — ONDANSETRON HCL 4 MG/2ML IJ SOLN
INTRAMUSCULAR | Status: AC
  Administered 2012-01-20: 4 mg via INTRAVENOUS
  Filled 2012-01-20: qty 2

## 2012-01-20 MED ORDER — PANTOPRAZOLE SODIUM 40 MG PO TBEC
40.0000 mg | DELAYED_RELEASE_TABLET | Freq: Every day | ORAL | Status: DC
  Administered 2012-01-21 – 2012-01-22 (×2): 40 mg via ORAL
  Filled 2012-01-20 (×2): qty 1

## 2012-01-20 MED ORDER — POLYETHYLENE GLYCOL 3350 17 G PO PACK
17.0000 g | PACK | Freq: Every day | ORAL | Status: DC | PRN
  Filled 2012-01-20: qty 1

## 2012-01-20 MED ORDER — OXYCODONE HCL 5 MG PO TABS
5.0000 mg | ORAL_TABLET | ORAL | Status: DC | PRN
  Administered 2012-01-21: 5 mg via ORAL
  Filled 2012-01-20: qty 1

## 2012-01-20 MED ORDER — HYDROMORPHONE HCL PF 1 MG/ML IJ SOLN
1.0000 mg | INTRAMUSCULAR | Status: DC | PRN
  Administered 2012-01-20: 1 mg via INTRAVENOUS
  Filled 2012-01-20: qty 1

## 2012-01-20 MED ORDER — SODIUM CHLORIDE 0.9 % IV SOLN
INTRAVENOUS | Status: DC
  Administered 2012-01-20: 1000 mL via INTRAVENOUS

## 2012-01-20 MED ORDER — HYDROMORPHONE HCL PF 1 MG/ML IJ SOLN
INTRAMUSCULAR | Status: AC
  Administered 2012-01-20: 1 mg via INTRAVENOUS
  Filled 2012-01-20: qty 1

## 2012-01-20 MED ORDER — SODIUM CHLORIDE 0.9 % IV SOLN: 250.0000 mL | INTRAVENOUS | Status: DC | PRN

## 2012-01-20 MED ORDER — GUAIFENESIN-DM 100-10 MG/5ML PO SYRP: 5.0000 mL | ORAL_SOLUTION | ORAL | Status: DC | PRN

## 2012-01-20 MED ORDER — ACETAMINOPHEN 325 MG PO TABS: 650.0000 mg | ORAL_TABLET | Freq: Four times a day (QID) | ORAL | Status: DC | PRN

## 2012-01-20 MED ORDER — ALBUTEROL SULFATE HFA 108 (90 BASE) MCG/ACT IN AERS
2.0000 | INHALATION_SPRAY | Freq: Four times a day (QID) | RESPIRATORY_TRACT | Status: DC
  Administered 2012-01-20 – 2012-01-22 (×9): 2 via RESPIRATORY_TRACT
  Filled 2012-01-20 (×3): qty 6.7

## 2012-01-20 MED ORDER — CEFTRIAXONE SODIUM 1 G IJ SOLR
1.0000 g | Freq: Once | INTRAMUSCULAR | Status: AC
  Administered 2012-01-20: 1 g via INTRAVENOUS
  Filled 2012-01-20: qty 10

## 2012-01-20 MED ORDER — METHYLPREDNISOLONE SODIUM SUCC 125 MG IJ SOLR
60.0000 mg | Freq: Two times a day (BID) | INTRAMUSCULAR | Status: DC
  Administered 2012-01-20 – 2012-01-22 (×4): 60 mg via INTRAVENOUS
  Filled 2012-01-20 (×5): qty 0.96

## 2012-01-20 NOTE — ED Notes (Signed)
Received pt. From home via EMS, pt. Ambulatory gait steady, NAD noted

## 2012-01-20 NOTE — H&P (Signed)
Hospital Admission Note Date: 01/20/2012  Patient name: Jorge [name] Medical record number: [id number] Date of birth: Feb 10, 1981 Age: 31 y.o. Gender: male PCP: No primary provider on file.   Rheumatologist: Dr. Charlestine Night  Attending physician: Venetia Maxon Cipriano Millikan, MD Emergency Contact: Austin,Gloria Mother [phone] Code Status:  Full code  Chief Complaint: "Unbearable chest pain"  History of Present Illness: Jorge [name] is an 31 y.o. male with PMH of lupus diagnosed at age 26 who presented to the hospital with 12/10 chest pain, located in the right anterior chest for the past 2 days.  He has not followed up with a physician or taken any medications to treat his Lupus for the past year. He has seen Dr. Charlestine Night in the past. Dilaudid has eased the pain off.  The pain is characterized as sharp, worsened by deep inspiration, coughing and movement, and associated with inability to sleep.  He has a cough productive of green sputum.  No associated fever or chills.  No associated dyspnea.    Past Medical History Past Medical History  Diagnosis Date  . Lupus   . Asthma   . Bronchopulmonary dysplasia   . History of pericarditis   . PAROTITIS, RIGHT 09/30/2009    Qualifier: Diagnosis of  By: Joya Gaskins MD, Burnett Harry     Past Surgical History Past Surgical History  Procedure Date  . Tonsillectomy   . Wisdom tooth extraction     Meds: Prior to Admission medications   Medication Sig Start Date End Date Taking? Authorizing Provider  hydroxychloroquine (PLAQUENIL) 200 MG tablet Take 400 mg by mouth daily.   Yes Historical Provider, MD  predniSONE (DELTASONE) 10 MG tablet Take 10 mg by mouth 2 (two) times daily.     Yes Historical Provider, MD    Allergies: Review of patient's allergies indicates no known allergies.  Social History: History   Social History  . Marital Status: Single    Spouse Name: N/A    Number of Children: 0  . Years of Education: 11   Occupational History  .  Disabled    Social History Main Topics  . Smoking status: Never Smoker   . Smokeless tobacco: Not on file  . Alcohol Use: Yes     Occasional wine  . Drug Use: No  . Sexually Active: No   Other Topics Concern  . Not on file   Social History Narrative   Lives alone.  Independent.    Family History:  Family History  Problem Relation Age of Onset  . Lupus Father   . HIV Brother     Review of Systems: Constitutional: No fever or chills;  Appetite normal; No weight loss.  HEENT: No blurry vision or diplopia, no pharyngitis or dysphagia CV: + chest pain, no arrhythmia.  Resp: No SOB, + cough. GI: No N/V, no diarrhea, no melena or hematochezia.  GU: No dysuria or hematuria.  MSK: no myalgias/arthralgias.  Neuro:  No headache or focal neurological deficits.  Psych: No depression or anxiety.  Endo: No thyroid disease or DM.  Skin: No rashes or lesions.  Heme: No anemia or blood dyscrasia   Physical Exam: Blood pressure 153/95, pulse 87, temperature 98.8 F (37.1 C), temperature source Oral, resp. rate 16, SpO2 100.00%. BP 153/95  Pulse 87  Temp(Src) 98.8 F (37.1 C) (Oral)  Resp 16  SpO2 100%  General Appearance:    Alert, cooperative, no distress, appears stated age  Head:    Normocephalic, without obvious abnormality, atraumatic  Eyes:  PERRL, conjunctiva/corneas clear, EOM's intact     Ears:    Normal external ear canals, both ears  Nose:   Nares normal, septum midline, mucosa normal, no drainage    or sinus tenderness  Throat:   Lips, mucosa, and tongue normal; teeth and gums normal  Neck:   Supple, symmetrical, trachea midline, no adenopathy;       thyroid:  No enlargement/tenderness/nodules; no carotid   bruit or JVD  Back:     Symmetric, no curvature, ROM normal, no CVA tenderness  Lungs:     Clear to auscultation diminished, respirations unlabored  Chest wall:    No tenderness or deformity  Heart:    Regular rate and rhythm, S1 and S2 normal, no murmur, rub   or  gallop  Abdomen:     Soft, non-tender, bowel sounds active all four quadrants,    no masses, no organomegaly  Extremities:   Extremities normal, atraumatic, no cyanosis or edema  Pulses:   2+ and symmetric all extremities  Skin:   Skin color, texture, turgor normal, no rashes or lesions  Lymph nodes:   Cervical, supraclavicular, and axillary nodes normal  Neurologic:   Non-focal    Lab results: Basic Metabolic Panel:  Lab [id number] 0900  NA 139  K 4.2  CL 106  CO2 24  GLUCOSE 78  BUN 16  CREATININE 1.09  CALCIUM 9.0  MG --  PHOS --   GFR The CrCl is unknown because both a height and weight (above a minimum accepted value) are required for this calculation.  CBC:  Lab 01/20/12 0900  WBC 8.1  NEUTROABS 4.8  HGB 14.2  HCT 44.3  MCV 83.0  PLT 266    Imaging results:   Dg Chest 2 View 01/20/2012  IMPRESSION: No acute chest findings.  Original Report Authenticated By: Markus Daft, M.D.    Ct Angio Chest W/cm &/or Wo Cm 01/20/2012 IMPRESSION:  1.  Airspace opacity in the left lower lobe, potentially from pneumonia or pulmonary hemorrhage. 2.  No embolus is observed. 3.  Airway thickening with some mosaic attenuation in the lungs. There is an appearance resembling paraseptal emphysema, although this may be more a manifestation of faint fibrosis and mosaic attenuation from the bronchitis given that cystic lung changes where an [place]. 4.  Stable 6 mm right middle lobe pulmonary nodule.  Original Report Authenticated By: Carron Curie, M.D.    Assessment & Plan: Principal Problem:  *Community acquired pneumonia  Admit for IV antibiotics: Rocephin and azithromycin to start.  Check sputum cultures.  Check HIV status.  Mucinex every 12 hours.  Robitussin-DM as needed for breakthrough cough. Active Problems:  Lupus  Start steroids.  Inpatient versus outpatient followup with rheumatology.  Pulmonary nodule, right  Stable on imaging.  High blood pressure  No  prior history of hypertension.  Monitor.  Hypoxia / H/O Asthma  Maintaining oxygen saturations at the present time.  Bronchodilators as needed.  Pleuritic chest pain  Likely pleurisy from pneumonia.  With history of pericarditis, pericardial inflammation may be contributory.  Ibuprofen every 8 hours.  Oxycodone as needed for breakthrough pain.  Prophylaxis: Lovenox for DVT prophylaxis, Protonix for GI prophylaxis given treatment with NSAIDs and Solu-Medrol.  Time Spent On Admission: One hour  Sherrilynn Gudgel 01/20/2012, 4:16 PM Pager [phone]

## 2012-01-20 NOTE — ED Notes (Signed)
[id number] Expected date:01/20/12<BR> Expected time: 6:20 AM<BR> Means of arrival:Ambulance<BR> Comments:<BR> Bilateral chest wall pain

## 2012-01-20 NOTE — ED Notes (Signed)
31 y/o male with hx of Lupus and chronic lung diseapresents with pain in the R chest which was acute in onset, constdant, worse with laying on the R side, worse with moving the R arm and with deep breathing.  Sx are constant, not associated with cough or fever and not associated with swelling in the legs.    PE:  ttp in the R chest, pain worsened with deep breathing and with palpation of chest / movemetn of the R arm.  No edema in the legs, heart with pulse of 100, lungs with rales L>R.  No rash.  Assessment:  Has CP which has strong reproducible component, pain meds are starting to work, has underlying lung disease which likely acounts for some of the abnormal lung sounds.  CXR pending, ECG, labs.  Medical screening examination/treatment/procedure(s) were conducted as a shared visit with non-physician practitioner(s) and myself.  I personally evaluated the patient during the encounter   Johnna Acosta, MD 01/20/12 [phone]

## 2012-01-20 NOTE — ED Provider Notes (Signed)
History     CSN: [id number]  Arrival date & time 01/20/12  [id number]   First MD Initiated Contact with Patient 01/20/12 [phone]      Chief Complaint  Patient presents with  . Chest Pain    (Consider location/radiation/quality/duration/timing/severity/associated sxs/prior treatment) HPI Comments: Patient reports right sided chest pain with shortness of breath, and cough x 2 days.  The pain began suddenly while he was in bed. Pain is worse with laying flat, palpation, and deep inspiration.  States it felt like a pulled muscle and was taking ibuprofen, doing hot and cold massage, and eating ice without improvement.  Patient has a hx of lupus, bronchopulmonary dysplasia, asthma.  Pt is out of his asthma medications, has not taken any medications since January.    Travel to [place] in early March by train and bus, no other recent travel.  No lower extremity swelling, no personal or family hx blood clots.   Patient is a 31 y.o. male presenting with chest pain. The history is provided by the patient.  Chest Pain     Past Medical History  Diagnosis Date  . Lupus   . Asthma   . Bronchopulmonary dysplasia     Past Surgical History  Procedure Date  . Tonsillectomy   . Wisdom tooth extraction     History reviewed. No pertinent family history.  History  Substance Use Topics  . Smoking status: Never Smoker   . Smokeless tobacco: Not on file  . Alcohol Use: No      Review of Systems  Cardiovascular: Positive for chest pain.    Allergies  Review of patient's allergies indicates no known allergies.  Home Medications   Current Outpatient Rx  Name Route Sig Dispense Refill  . COLCHICINE 0.6 MG PO TABS Oral Take 0.6 mg by mouth daily.    Marland Kitchen HYDROXYCHLOROQUINE SULFATE 200 MG PO TABS Oral Take 400 mg by mouth daily.    Marland Kitchen PREDNISONE 10 MG PO TABS Oral Take 10 mg by mouth 2 (two) times daily.        BP 147/99  Pulse 102  Temp(Src) 98.8 F (37.1 C) (Oral)  Resp 20  SpO2  99%  Physical Exam  Nursing note and vitals reviewed. Constitutional: He is oriented to person, place, and time. He appears well-developed and well-nourished. No distress.       Pt sitting upright, unable to lay backward due to pain   HENT:  Head: Normocephalic and atraumatic.  Neck: Normal range of motion. Neck supple.  Cardiovascular: Regular rhythm.  Tachycardia present.   Pulmonary/Chest: Tachypnea noted. He has no decreased breath sounds. He has wheezes. He has rales. He exhibits tenderness. He exhibits no crepitus, no edema and no retraction.    Abdominal: Soft. He exhibits no distension. There is no tenderness. There is no rebound and no guarding.  Musculoskeletal: Normal range of motion. He exhibits no edema and no tenderness.  Neurological: He is alert and oriented to person, place, and time. No cranial nerve deficit. He exhibits normal muscle tone.  Skin: He is not diaphoretic.  Psychiatric: He has a normal mood and affect. His behavior is normal. Judgment and thought content normal.    ED Course  Procedures (including critical care time)  Labs Reviewed  DIFFERENTIAL - Abnormal; Notable for the following:    Monocytes Relative 14 (*)    Monocytes Absolute 1.2 (*)    All other components within normal limits  CBC  BASIC METABOLIC PANEL  Dg Chest 2 View  01/20/2012  *RADIOLOGY REPORT*  Clinical Data: Right upper chest pain and congestion.  CHEST - 2 VIEW  Comparison: 10/20/2011  Findings: Two views of the chest were obtained.  The lungs are clear bilaterally.  Heart and mediastinum are within normal limits. Trachea is midline.  No evidence for a pneumothorax.  IMPRESSION: No acute chest findings.  Original Report Authenticated By: Markus Daft, M.D.   Ct Angio Chest W/cm &/or Wo Cm  01/20/2012  *RADIOLOGY REPORT*  Clinical Data: Acute right chest pain.  Lupus.  CT ANGIOGRAPHY CHEST  Technique:  Multidetector CT imaging of the chest using the standard protocol during bolus  administration of intravenous contrast. Multiplanar reconstructed images including MIPs were obtained and reviewed to evaluate the vascular anatomy.  Contrast: 159mL OMNIPAQUE IOHEXOL 300 MG/ML  SOLN  Comparison: 01/20/2012; 04/09/2009  Findings: No filling defect is identified in the pulmonary arterial tree to suggest pulmonary embolus.  Borderline prominent hilar and infrahilar nodal tissue noted, stable.  Scattered axillary and subpectoral lymph nodes are less striking than on the prior exam from 2010.  Large right middle lobe noted.  There is a 6 mm right middle lobe pulmonary nodule which is stable 2010 and likely benign.  Airway thickening is present bilaterally, with central interstitial accentuation.  There is considerable airspace opacity in the left lower lobe, compatible with pneumonia or pulmonary hemorrhage.  Mild mosaic attenuation in the lungs noted, possibly related to the bronchitis.  There is a suggestion of a faint peripheral interstitial accentuation in the lung bases, potentially from minimal fibrosis.  IMPRESSION:  1.  Airspace opacity in the left lower lobe, potentially from pneumonia or pulmonary hemorrhage. 2.  No embolus is observed. 3.  Airway thickening with some mosaic attenuation in the lungs. There is an appearance resembling paraseptal emphysema, although this may be more a manifestation of faint fibrosis and mosaic attenuation from the bronchitis given that cystic lung changes where an Olympus. 4.  Stable 6 mm right middle lobe pulmonary nodule.  Original Report Authenticated By: Carron Curie, M.D.    7:11 AM Patient seen and examined, labs, xray, breathing treatment ordered.  On my exam, HR low 100s, O2 on room air 92-97.  Will continue to follow closely.    7:46 AM Discussed patient with Dr Sabra Heck who has also seen and examined patient.    Date: 01/20/2012  Rate: 98  Rhythm: normal sinus rhythm  QRS Axis: normal  Intervals: normal  ST/T Wave abnormalities:  normal  Conduction Disutrbances:none  Narrative Interpretation:   Old EKG Reviewed: unchanged  9:00 AM Patient reports pain has improved, breathing improved but not back to baseline.  On exam, patient's wheezing greatly improved, though it persists.  Patient states he has not been on prednisone or any asthma medications since January.  States that he frequently has attacks during change of seasons.   10:28 AM Patient was on oxygen by [place], stated he was feeling well enough to go home.  I took off O2 and patient dropped to 82% on room air.  I will order CT chest.    12:28 PM Discussed patient with Dr Kae Heller, who has also seen and examined patient.  Dr Kae Heller to speak with pulmonologist on call.  Plan is for admission.    12:33 PM Dr Kae Heller has spoken with the on call pulmonary on call doctor who will see patient in consult.    1:16 PM Admitted to [facility] hospitalist.  Spoke with Dr  Rama.     1. CAP (community acquired pneumonia)   2. Pulmonary hemorrhage   3. Hypoxia       MDM  Afebrile patient with acute onset chest pain, SOB.  Wheezing and rales on exam.  Pt improved with neb treatments and CXR was negative.  Pt was placed at some point on nasal canula by staff without notifying me.  I took patient off of [place] and found that patient dropped to 82% on room air.  CT angio of his chest revealed LLL hemorrhage vs pneumonia.  Patient has not had fever, WBC count is normal.  Doubt pneumonia.  However, Dr Kae Heller spoke with Dr Joya Gaskins, pulmonologist, who stated the CT appeared to show pneumonitis/pneumonia and suggested we treat it as such.  Per discussion with Dr Kae Heller, I ordered azithromycin and rocephin.  Patient admitted to [facility] hospitalist with pulmonary to consult.          Otis Brace, [place] 01/20/12 1352

## 2012-01-20 NOTE — ED Provider Notes (Signed)
  I performed a history and physical examination of Jorge [name] and discussed his management with Clayton Bibles PA-C.  I agree with the history, physical, assessment, and plan of care, with the following exceptions: None  The patient is awake, alert, and oriented in no significant or apparent distress, breathing easily, euthymic, 100% oxygen saturation on nasal cannula supplementary oxygen. Off of nasal cannula supplementary oxygen and told that his oxygen saturation declined below 90%. Patient has good lung sounds and good air exchange in all fields except for the right base posteriorly where he has rales. Evaluation of his chest x-ray shows no apparent pneumonia or parenchymal disease. CT scan of his chest which was ordered to exclude pulmonary embolism shows no pulmonary embolism, but shows changes of bronchopulmonary dysplasia, condition the patient was diagnosed with early in life having been born premature, and possible parenchymal lung hemorrhage versus pneumonia in the right lung base posteriorly where the adverse lung sounds are appreciated on examination. This is also where the patient is having pain. I called and spoke with Dr. Joya Gaskins of pulmonary medicine who evaluated the CT scan and remarks that with the patient not having hemoptysis this is unlikely to be parenchymal hemorrhage at the volume that it represents, and is not hemothorax is doesn't appear to be in the pleural space, but that he does not pneumonitis present and that this looks more compatible with a pneumonia, recommending antibiotic therapy despite the patient not being febrile, septic appearing, or with a leukocytosis. He advises to admit the patient to the medical service and his service will provide consultation for the patient. No blood cultures will be drawn because the patient is nonfebrile, no leukocytosis, stable clinically, not septic appearing, and will not require ICU admission. This would be community-acquired pneumonia do  to the patient not having been hospitalized within the past 3 months, not on immunosuppressants, and not with any risk factors for pseudomonas or multi-drug-resistant organisms at this time. We'll treat him empirically with ceftriaxone and azithromycin IV in him to medical service for  I was present for the following procedures: None Time Spent in Critical Care of the patient: None Time spent in discussions with the patient and family: 10 minutes   Jorge [name]    Charlena Cross, MD 01/20/12 1238

## 2012-01-20 NOTE — ED Notes (Signed)
[id number] Expected date:<BR> Expected time:<BR> Means of arrival:<BR> Comments:<BR> closed

## 2012-01-20 NOTE — ED Notes (Signed)
Pt unable to lie back, crying out in pain and in acute distress from chest wall pain associated with violent coughing and labored resp effort.

## 2012-01-21 DIAGNOSIS — J189 Pneumonia, unspecified organism: Principal | ICD-10-CM

## 2012-01-21 DIAGNOSIS — R071 Chest pain on breathing: Secondary | ICD-10-CM

## 2012-01-21 DIAGNOSIS — J45909 Unspecified asthma, uncomplicated: Secondary | ICD-10-CM

## 2012-01-21 DIAGNOSIS — J479 Bronchiectasis, uncomplicated: Secondary | ICD-10-CM

## 2012-01-21 DIAGNOSIS — M329 Systemic lupus erythematosus, unspecified: Secondary | ICD-10-CM

## 2012-01-21 LAB — BASIC METABOLIC PANEL
CO2: 23 mEq/L (ref 19–32)
Chloride: 106 mEq/L (ref 96–112)
Creatinine, Ser: 0.98 mg/dL (ref 0.50–1.35)
GFR calc Af Amer: >90 mL/min (ref >90)
Potassium: 4.5 mEq/L (ref 3.5–5.1)

## 2012-01-21 LAB — CBC
HCT: 40.8 % (ref 39.0–52.0)
Hemoglobin: 12.6 g/dL — ABNORMAL LOW (ref 13.0–17.0)
MCV: 81.9 fL (ref 78.0–100.0)
RBC: 4.98 MIL/uL (ref 4.22–5.81)
RDW: 15.2 % (ref 11.5–15.5)
WBC: 5.8 10*3/uL (ref 4.0–10.5)

## 2012-01-21 MED ORDER — AMLODIPINE BESYLATE 10 MG PO TABS
10.0000 mg | ORAL_TABLET | Freq: Every day | ORAL | Status: DC
  Administered 2012-01-21 – 2012-01-22 (×2): 10 mg via ORAL
  Filled 2012-01-21 (×2): qty 1

## 2012-01-21 MED ORDER — LEVOFLOXACIN 750 MG PO TABS
750.0000 mg | ORAL_TABLET | Freq: Every day | ORAL | Status: DC
  Administered 2012-01-21 – 2012-01-22 (×2): 750 mg via ORAL
  Filled 2012-01-21 (×2): qty 1

## 2012-01-21 MED ORDER — ZOLPIDEM TARTRATE 5 MG PO TABS
5.0000 mg | ORAL_TABLET | Freq: Once | ORAL | Status: AC
  Administered 2012-01-21: 5 mg via ORAL
  Filled 2012-01-21: qty 1

## 2012-01-21 NOTE — Consult Note (Signed)
Name: Jorge [name] MRN: [id number] DOB: April 04, 1981    LOS: 1  [facility]  NOTE  Brief patient profile: : 30 yobm never smoker last seen by [facility] 2010 with dx of bronchiectasis/ bronchopulm dysplasia and steroid dep lupus admit by trial 4/13 with R ant cp and ct suggesting L LL Pna and pccm svc asked to see 4/14     Micro/sepsis markers: Sputum ordered 4/13 not done  Antibiotics: Roc (cap) 4/13 >> Zmax (cap) 4/13 >>  Tests / Events:   HPI  Office notes 08/18/08:  hosp 10/28 to 08/11/08  for pericarditis. Pos ANA 1:640 and RF pos 56 . High ESR. Small pericardial effusion on echo. Pt rx with steroids and colchicine. Pt also given 6 d of avelox. > all better   Pt reports no sob or cough on prednisone rx for lupus until the abrupt illness 48 h pta.  No need for daytime saba or inhalers at all  Sleeping ok without nocturnal  or early am exacerbation  of respiratory  c/o's or need for noct saba. Also denies any obvious fluctuation of symptoms with weather or environmental changes or other aggravating or alleviating factors except as outlined above   Does have chronic dental problems "whole in my tooth I'm supposed to get fixed where food gets stuck" not seen by dentist over a year.  ROS  At present neg for  any significant sore throat, dysphagia,   itching, sneezing,  nasal congestion or excess/ purulent secretions, ear ache,   fever, chills, sweats, unintended wt loss,  exertional cp, hemoptysis, palpitations, orthopnea pnd or leg swelling.  Also denies presyncope, palpitations, heartburn, abdominal pain, anorexia, nausea, vomiting, diarrhea  or change in bowel or urinary habits, change in stools or urine, dysuria,hematuria,  rash, arthralgias, visual complaints, headache, numbness weakness or ataxia or problems with walking or coordination. No noted change in mood/affect or memory.                     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 Lupus   . Asthma   . Bronchopulmonary dysplasia   . History of pericarditis   . PAROTITIS, RIGHT 09/30/2009    Qualifier: Diagnosis of  By: Joya Gaskins MD, Burnett Harry    Past Surgical History  Procedure Date  . Tonsillectomy   . Wisdom tooth extraction    Prior to Admission medications   Medication Sig Start Date End Date Taking? Authorizing Provider  hydroxychloroquine (PLAQUENIL) 200 MG tablet Take 400 mg by mouth daily.   Yes Historical Provider, MD  predniSONE (DELTASONE) 10 MG tablet Take 10 mg by mouth 2 (two) times daily.     Yes Historical Provider, MD   Allergies No Known Allergies  Family History Family History  Problem Relation Age of Onset  . Lupus Father   . HIV Brother     Social History  reports that he has never smoked. He does not have any smokeless tobacco history on file. He reports that he drinks alcohol. He reports that he does not use illicit drugs.     Vital Signs: BP 173/107  Pulse 58  Temp(Src) 97.7 F (36.5 C) (Oral)  Resp 20  Ht 5\' 8"  (1.727 m)  Wt 153 lb 7 oz (69.6 kg)  BMI 23.33 kg/m2  SpO2 97%  RA  Intake/Output Summary (Last 24 hours) at 01/21/12 1101 Last data filed at 01/21/12 0930  Gross per 24 hour  Intake    360 ml  Output      0 ml  Net    360 ml     Physical Examination: General:  Non toxic bm nad HEENT: nl dentition, turbinates, and orophanx. Nl external ear canals without cough reflex   NECK :  without JVD/Nodes/TM/ nl carotid upstrokes bilaterally   LUNGS: no acc muscle use, clear to A and P bilaterally without cough on insp or exp maneuvers   CV:  RRR  no s3 or murmur or increase in P2, no edema   ABD:  soft and nontender with nl excursion in the supine  position. No bruits or organomegaly, bowel sounds nl  MS:  warm without deformities, calf tenderness, cyanosis or clubbing  SKIN: warm and dry without lesions    NEURO:  alert, approp, no deficits        Labs    Lab 01/21/12 0625 01/20/12 2055 01/20/12 0900  NA 138 -- 139  K 4.5 -- 4.2  CL 106 -- 106  CO2 23 -- 24  BUN 14 -- 16  CREATININE 0.98 1.00 1.09  GLUCOSE 126* -- 78    Lab 01/21/12 0625 01/20/12 0900  HGB 12.6* 14.2  HCT 40.8 44.3  WBC 5.8 8.1  PLT 242 266        CT chest 4/13 1. Airspace opacity in the left lower lobe, potentially from  pneumonia or pulmonary hemorrhage.  2. No embolus is observed.  3. Airway thickening with some mosaic attenuation in the lungs.  There is an appearance resembling paraseptal emphysema, although  this may be more a manifestation of faint fibrosis and mosaic  attenuation from the bronchitis given that cystic lung changes  where an Olympus.  4. Stable 6 mm right middle lobe pulmonary nodule.     ASSESSMENT AND PLAN    PULMONARY No results found for this basename: PHART:5,PCO2:5,PCO2ART:5,PO2ART:5,HCO3:5,O2SAT:5 in the last 168 hours A:  Probable CAP in pt with bronchiectasis rec add levaquin and consider discharge to pulmonary f/u p one week rx (Dr Joya Gaskins or our NP)  ddx includes lupus pneumonitis but he's not having any systemic symptoms or hemoptysis but rather green sputum typical of a bronchiectasis flare.   No further inpt rx waranted from out perspective.   ? 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

## 2012-01-21 NOTE — ED Provider Notes (Signed)
Medical screening examination/treatment/procedure(s) were conducted as a shared visit with non-physician practitioner(s) and myself.  I personally evaluated the patient during the encounter  Please see my separate respective documentation pertaining to this patient encounter   Johnna Acosta, MD 01/21/12 [phone]

## 2012-01-21 NOTE — Progress Notes (Signed)
PROGRESS NOTE  Jorge [name] [id number] DOB: 07-02-1981 DOA: 01/20/2012 PCP: No primary provider on file.  Brief narrative: Jorge [name] is an 31 y.o. male with PMH of lupus diagnosed at age 35, bronchiectasis, and h/o premature birth who presented to the hospital with 12/10 chest pain, located in the right anterior chest for the past 2 days. He has not followed up with a physician or taken any medications to treat his Lupus for the past year.  CT scans of the chest showed pneumonia, and he was referred to the hospitalist service for treatment.  He has also been seen by Dr. Melvyn Novas of pulmonology.   Assessment/Plan: Principal Problem:  *Community acquired pneumonia  Admit for IV antibiotics: Rocephin and azithromycin to start.  Dr. Melvyn Novas added Levaquin 01/21/12. Sputum cultures pending.  HIV negative.  Continue Mucinex every 12 hours.  Continue Robitussin-DM as needed for breakthrough cough. Active Problems:  Lupus  Continue steroids.  Outpatient followup with rheumatology and pulmonology. Pulmonary nodule, right  Stable on imaging. High blood pressure  No prior history of hypertension.  Norvasc started 01/21/12. BP 170s / 100s Hypoxia / H/O Asthma  Maintaining oxygen saturations at the present time.  Bronchodilators as needed. Pleuritic chest pain  Likely pleurisy from pneumonia.  With history of pericarditis, pericardial inflammation may be contributory.  Ibuprofen every 8 hours.  Oxycodone as needed for breakthrough pain.    Code Status: Full Family Communication: None at bedside. Disposition Plan: Home, when stable  Consultants:  Dr. Christinia Gully, Pulmonology  Antibiotics:  Azithromycin 01/20/12--->  Rocephin 01/20/12--->  Levaquin 01/21/12--->   Subjective  Jorge [name].  Chest pain has improved.  No dyspnea, but ongoing cough.    Objective    Interim History: Stable overnight.   Objective: Filed Vitals:   01/21/12 0226 01/21/12  0609 01/21/12 0706 01/21/12 0803  BP:  174/100 173/107   Pulse:  57 58   Temp:   97.7 F (36.5 C)   TempSrc:   Oral   Resp:  20 20   Height:      Weight:      SpO2: 98%  100% 97%    Intake/Output Summary (Last 24 hours) at 01/21/12 1224 Last data filed at 01/21/12 0930  Gross per 24 hour  Intake    360 ml  Output      0 ml  Net    360 ml    Exam: Gen:  NAD Cardiovascular:  RRR, No M/R/G Respiratory: Lungs CTAB/ course Gastrointestinal: Abdomen soft, NT/ND with normal active bowel sounds. Extremities: No C/E/C    Data Reviewed: Basic Metabolic Panel:  Lab [id number] 0625 01/20/12 2055 01/20/12 0900  NA 138 -- 139  K 4.5 -- 4.2  CL 106 -- 106  CO2 23 -- 24  GLUCOSE 126* -- 78  BUN 14 -- 16  CREATININE 0.98 1.00 1.09  CALCIUM 8.8 -- 9.0  MG -- -- --  PHOS -- -- --   GFR Estimated Creatinine Clearance: 108.6 ml/min (by C-G formula based on Cr of 0.98). Liver Function Tests:  Lab 01/20/12 2055  AST 35  ALT 25  ALKPHOS 67  BILITOT 0.2*  PROT 7.8  ALBUMIN 3.1*    CBC:  Lab 01/21/12 0625 01/20/12 0900  WBC 5.8 8.1  NEUTROABS -- 4.8  HGB 12.6* 14.2  HCT 40.8 44.3  MCV 81.9 83.0  PLT 242 266   Microbiology No results found for this or any previous visit (from the past 240 hour(s)).  Procedures and Diagnostic Studies:  Dg Chest 2 View 01/20/2012 IMPRESSION: No acute chest findings.  Original Report Authenticated By: Markus Daft, M.D.    Ct Angio Chest W/cm &/or Wo Cm 01/20/2012 IMPRESSION:  1.  Airspace opacity in the left lower lobe, potentially from pneumonia or pulmonary hemorrhage. 2.  No embolus is observed. 3.  Airway thickening with some mosaic attenuation in the lungs. There is an appearance resembling paraseptal emphysema, although this may be more a manifestation of faint fibrosis and mosaic attenuation from the bronchitis given that cystic lung changes where an Olympus. 4.  Stable 6 mm right middle lobe pulmonary nodule.  Original Report  Authenticated By: Carron Curie, M.D.    Scheduled Meds:   . albuterol  2 puff Inhalation Q6H  . amLODipine  10 mg Oral Daily  . azithromycin  500 mg Intravenous Once  . azithromycin  500 mg Intravenous Q24H  . cefTRIAXone (ROCEPHIN)  IV  1 g Intravenous Once  . cefTRIAXone (ROCEPHIN)  IV  1 g Intravenous Q24H  . enoxaparin  40 mg Subcutaneous Q24H  . guaiFENesin  600 mg Oral BID  . ibuprofen  800 mg Oral TID  . levofloxacin  750 mg Oral Daily  . methylPREDNISolone (SOLU-MEDROL) injection  60 mg Intravenous Q12H  .  morphine injection  4 mg Intravenous Once  . pantoprazole  40 mg Oral Q1200  . predniSONE      . sodium chloride  3 mL Intravenous Q12H  . DISCONTD: sodium chloride   Intravenous STAT   Continuous Infusions:     LOS: 1 day   Jacquelynn Cree, MD Pager [phone]  01/21/2012, 12:24 PM   Patient Teaching (information given to and reviewed with the patient):  Jorge [name] 01/21/2012 Hospitalist: Dr. Margreta Journey Rama  Medical Issues and plan: Principal Problem:  *Infection of the lungs (Community acquired pneumonia) Admit for IV antibiotics: Rocephin and azithromycin to start.  Dr. Melvyn Novas added Levaquin 01/21/12. Sputum cultures pending.  HIV negative.  Continue Mucinex every 12 hours.  Continue Robitussin-DM as needed for breakthrough cough. Active Problems:  Lupus  Continue steroids.  Outpatient followup with rheumatology and pulmonology. Lung "knot" (nodule) Stable on imaging. High blood pressure  No prior history of high blood pressure  Norvasc started 01/21/12. BP 170s / 100s (goal less than 130 / 80) Low oxygen levels / Asthma Maintaining oxygen saturations at the present time.  Bronchodilators as needed. Chest pain  Likely from inflammation in the lungs.  Ibuprofen every 8 hours.  Oxycodone as needed for breakthrough pain.

## 2012-01-22 DIAGNOSIS — J479 Bronchiectasis, uncomplicated: Secondary | ICD-10-CM

## 2012-01-22 DIAGNOSIS — J189 Pneumonia, unspecified organism: Secondary | ICD-10-CM

## 2012-01-22 DIAGNOSIS — M329 Systemic lupus erythematosus, unspecified: Secondary | ICD-10-CM

## 2012-01-22 LAB — EXPECTORATED SPUTUM ASSESSMENT W GRAM STAIN, RFLX TO RESP C

## 2012-01-22 MED ORDER — AZITHROMYCIN 500 MG PO TABS
500.0000 mg | ORAL_TABLET | ORAL | Status: DC
  Filled 2012-01-22: qty 1

## 2012-01-22 MED ORDER — HYDRALAZINE HCL 20 MG/ML IJ SOLN
5.0000 mg | Freq: Once | INTRAMUSCULAR | Status: AC
  Administered 2012-01-22: 5 mg via INTRAVENOUS
  Filled 2012-01-22: qty 1

## 2012-01-22 MED ORDER — ALBUTEROL SULFATE HFA 108 (90 BASE) MCG/ACT IN AERS: 2.0000 | INHALATION_SPRAY | Freq: Four times a day (QID) | RESPIRATORY_TRACT | Status: DC

## 2012-01-22 MED ORDER — AMLODIPINE BESYLATE 10 MG PO TABS: 10.0000 mg | ORAL_TABLET | Freq: Every day | ORAL | Status: DC

## 2012-01-22 MED ORDER — LISINOPRIL 20 MG PO TABS
20.0000 mg | ORAL_TABLET | Freq: Every day | ORAL | Status: DC
  Administered 2012-01-22: 20 mg via ORAL
  Filled 2012-01-22 (×2): qty 1

## 2012-01-22 MED ORDER — CEFUROXIME AXETIL 500 MG PO TABS: 500.0000 mg | ORAL_TABLET | Freq: Two times a day (BID) | ORAL | Status: DC

## 2012-01-22 MED ORDER — OXYCODONE HCL 5 MG PO TABS: 5.0000 mg | ORAL_TABLET | ORAL | Status: DC | PRN

## 2012-01-22 MED ORDER — LEVOFLOXACIN 750 MG PO TABS: 750.0000 mg | ORAL_TABLET | Freq: Every day | ORAL | Status: DC

## 2012-01-22 MED ORDER — PREDNISONE 10 MG PO TABS: 10.0000 mg | ORAL_TABLET | Freq: Two times a day (BID) | ORAL | Status: DC

## 2012-01-22 MED ORDER — GUAIFENESIN ER 600 MG PO TB12: 600.0000 mg | ORAL_TABLET | Freq: Two times a day (BID) | ORAL | Status: DC

## 2012-01-22 MED ORDER — IBUPROFEN 800 MG PO TABS: 800.0000 mg | ORAL_TABLET | Freq: Three times a day (TID) | ORAL | Status: AC | PRN

## 2012-01-22 MED ORDER — GUAIFENESIN-DM 100-10 MG/5ML PO SYRP: 5.0000 mL | ORAL_SOLUTION | ORAL | Status: AC | PRN

## 2012-01-22 MED ORDER — PANTOPRAZOLE SODIUM 40 MG PO TBEC: 40.0000 mg | DELAYED_RELEASE_TABLET | Freq: Every day | ORAL | Status: DC

## 2012-01-22 MED ORDER — AZITHROMYCIN 500 MG PO TABS: 500.0000 mg | ORAL_TABLET | ORAL | Status: AC

## 2012-01-22 MED ORDER — LISINOPRIL 20 MG PO TABS: 20.0000 mg | ORAL_TABLET | Freq: Every day | ORAL | Status: DC

## 2012-01-22 MED ORDER — HYDROXYCHLOROQUINE SULFATE 200 MG PO TABS: 400.0000 mg | ORAL_TABLET | Freq: Every day | ORAL | Status: DC

## 2012-01-22 NOTE — Progress Notes (Signed)
UR completed 

## 2012-01-22 NOTE — Discharge Instructions (Signed)
Lupus Lupus (also called systemic lupus erythematosus, SLE) is a disorder of the body's natural defense system (immune system). In lupus, the immune system attacks various areas of the body (autoimmune disease). CAUSES The cause is unknown. However, lupus runs in families. Certain genes can make you more likely to develop lupus. It is 10 times more common in women than in men. Lupus is also more common in African Americans and Asians. Other factors also play a role, such as viruses (Epstein-Barr virus, EBV), stress, hormones, cigarette smoke, and certain drugs. SYMPTOMS Lupus can affect many parts of the body, including the joints, skin, kidneys, lungs, heart, nervous system, and blood vessels. The signs and symptoms of lupus differ from person to person. The disease can range from mild to life-threatening. Typical features of lupus include:  Butterfly-shaped rash over the face.   Arthritis involving one or more joints.   Kidney disease.   Fever, weight loss, hair loss, fatigue.   Poor circulation in the fingers and toes (Raynaud's disease).   Chest pain when taking deep breaths. Abdominal pain may also occur.   Skin rash in areas exposed to the sun.   Sores in the mouth and nose.  DIAGNOSIS Diagnosing lupus can take a long time and is often difficult. An exam and an accurate account of your symptoms and health problems is very important. Blood tests are necessary, though no single test can confirm or rule out lupus. Most people with lupus test positive for antinuclear antibodies (ANA) on a blood test. Additional blood tests, a urine test (urinalysis), and sometimes a kidney or skin tissue sample (biopsy) can help to confirm or rule out lupus. TREATMENT There is no cure for lupus. Your caregiver will develop a treatment plan based on your age, sex, health, symptoms, and lifestyle. The goals are to prevent flares, to treat them when they do occur, and to minimize organ damage and  complications. How the disease may affect each person varies widely. Most people with lupus can live normal lives, but this disorder must be carefully monitored. Treatment must be adjusted as necessary to prevent serious complications. Medicines used for treatment:  Nonsteroidal anti-inflammatory drugs (NSAIDs) decrease inflammation and can help with chest pain, joint pain, and fevers. Examples include ibuprofen and naproxen.   Antimalarial drugs were designed to treat malaria. They also treat fatigue, joint pain, skin rashes, and inflammation of the lungs in patients with lupus.   Corticosteroids are powerful hormones that rapidly suppress inflammation. The lowest dose with the highest benefit will be chosen. They can be given by cream, pills, injections, and through the vein (intravenously).   Immunosuppressive drugs block the making of immune cells. They may be used for kidney or nerve disease.  HOME CARE INSTRUCTIONS  Exercise. Low-impact activities can usually help keep joints flexible without being too strenuous.   Rest after periods of exercise.   Avoid excessive sun exposure.   Follow proper nutrition and take supplements as recommended by your caregiver.   Stress management can be helpful.  SEEK MEDICAL CARE IF:  You have increased fatigue.   You develop pain.   You develop a rash.   You have an oral temperature above 102 F (38.9 C).   You develop abdominal discomfort.   You develop a headache.   You experience dizziness.  [place] of Neurological Disorders and Stroke: [employer] of Rheumatology: www.rheumatology.[facility] of Arthritis and Musculoskeletal and Skin Diseases: www.niams.[employer] Document Released: 09/15/2002 Document Revised:  09/14/2011 Document Reviewed: 01/06/2010 [facility] Patient Information 2012 [place].

## 2012-01-22 NOTE — Progress Notes (Signed)
PHARMACIST - PHYSICIAN COMMUNICATION DR:   Rama CONCERNING: Antibiotic IV to Oral Route Change Policy  RECOMMENDATION: This patient is receiving azithromycin by the intravenous route.  Based on criteria approved by the Pharmacy and Therapeutics Committee, the antibiotic(s) is/are being converted to the equivalent oral dose form(s).   DESCRIPTION: These criteria include:  Patient being treated for a respiratory tract infection, urinary tract infection, or cellulitis  The patient is not neutropenic and does not exhibit a GI malabsorption state  The patient is eating (either orally or via tube) and/or has been taking other orally administered medications for a least 24 hours  The patient is improving clinically and has a Tmax < 100.5  If you have questions about this conversion, please contact the Pharmacy Department  []   [phone] )  [facility] []   [phone] )  [facility]  []   [phone] )  [facility] [x]   [phone] )  Melvina, PharmD, BCPS

## 2012-01-22 NOTE — Plan of Care (Signed)
Problem: Phase I Progression Outcomes Goal: First antibiotic given within 6hrs of admit Outcome: Completed/Met Date Met:  01/22/12 Antibiotics given w/in 6 hours of suspected PNA dx

## 2012-01-22 NOTE — Consult Note (Signed)
Name: Jorge [name] MRN: [id number] DOB: 1981/08/09    LOS: 2  [place] PCCCM  NOTE  Brief patient profile: : 30 yobm never smoker last seen by [facility] 2010 with dx of bronchiectasis/ bronchopulm dysplasia and steroid dep lupus admit by trial 4/13 with Rt ant cp and ct suggesting LLL Pna.  PCCM asked to see 4/14  Culture: Sputum 4/15>>  Antibiotics: Rocephin (cap) 4/13 >> Zithromax(cap) 4/13 >>  Tests / Events: 4/14 CT chest>>6 mm nodule RML stable since 2010, LLL ASD  HPI Breathing is better. Chest pain improved.  Still has cough, but not much sputum.  No wheeze.    Vital Signs: BP 146/96  Pulse 87  Temp(Src) 98.4 F (36.9 C) (Oral)  Resp 18  Ht 5\' 8"  (1.727 m)  Wt 153 lb 7 oz (69.6 kg)  BMI 23.33 kg/m2  SpO2 100%  RA  Intake/Output Summary (Last 24 hours) at 01/22/12 1315 Last data filed at 01/22/12 1035  Gross per 24 hour  Intake   1093 ml  Output      0 ml  Net   1093 ml     Physical Examination: General - no distress HEENT - no sinus tenderness Cardiac - s1s2 regular Chest - no wheeze, faint rales Lt base Abd - soft, nontender Ext - no edema Neuro - normal strength Psych - normal mood, behavior  CBC    Component Value Date/Time   WBC 5.8 01/21/2012 0625   RBC 4.98 01/21/2012 0625   HGB 12.6* 01/21/2012 0625   HCT 40.8 01/21/2012 0625   PLT 242 01/21/2012 0625   MCV 81.9 01/21/2012 0625   MCH 25.3* 01/21/2012 0625   MCHC 30.9 01/21/2012 0625   RDW 15.2 01/21/2012 0625   LYMPHSABS 2.0 01/20/2012 0900   MONOABS 1.2* 01/20/2012 0900   EOSABS 0.2 01/20/2012 0900   BASOSABS 0.0 01/20/2012 0900    BMET    Component Value Date/Time   NA 138 01/21/2012 0625   K 4.5 01/21/2012 0625   CL 106 01/21/2012 0625   CO2 23 01/21/2012 0625   GLUCOSE 126* 01/21/2012 0625   BUN 14 01/21/2012 0625   CREATININE 0.98 01/21/2012 0625   CALCIUM 8.8 01/21/2012 0625   GFRNONAA >90 01/21/2012 0625   GFRAA >90 01/21/2012 0625    ASSESSMENT AND PLAN  Community acquired pneumonia  in setting of asthma, bronchiectasis, bronchopulmonary dysplasia, and SLE -much improved -complete Abx as detailed by hospitalist team -chronic prednisone for SLE per hospitalist -he is scheduled for HFU with Rexene Edison on April 23 at 3:15 PM in [place] office   Richardson Landry Minor ACNP Maryanna Shape PCCM Pager [phone] till 3 pm If no answer page [phone] 01/22/2012, 1:15 PM  Reviewed above, examined patient, and agree with assessment/plan.  Chesley Mires, MD 01/22/2012, 1:52 PM Pager:  [phone]

## 2012-01-22 NOTE — Discharge Summary (Signed)
Physician Discharge Summary  Patient ID: Jorge [name] MRN: [id number] DOB/AGE: [birth year] 31 y.o.  Admit date: 01/20/2012 Discharge date: 01/22/2012  Primary Care Physician:  No primary provider on file.   Discharge Diagnoses:    Present on Admission:  .Community acquired pneumonia .Lupus .Pulmonary nodule, right .High blood pressure .Hypoxia .Pleuritic chest pain  Discharge Medications:  Medication List  As of 01/22/2012  1:06 PM   TAKE these medications         albuterol 108 (90 BASE) MCG/ACT inhaler   Commonly known as: PROVENTIL HFA;VENTOLIN HFA   Inhale 2 puffs into the lungs every 6 (six) hours.      amLODipine 10 MG tablet   Commonly known as: NORVASC   Take 1 tablet (10 mg total) by mouth daily.      azithromycin 500 MG tablet   Commonly known as: ZITHROMAX   Take 1 tablet (500 mg total) by mouth daily.      cefUROXime 500 MG tablet   Commonly known as: CEFTIN   Take 1 tablet (500 mg total) by mouth 2 (two) times daily.      guaiFENesin 600 MG 12 hr tablet   Commonly known as: MUCINEX   Take 1 tablet (600 mg total) by mouth 2 (two) times daily.      guaiFENesin-dextromethorphan 100-10 MG/5ML syrup   Commonly known as: ROBITUSSIN DM   Take 5 mLs by mouth every 4 (four) hours as needed for cough.      hydroxychloroquine 200 MG tablet   Commonly known as: PLAQUENIL   Take 2 tablets (400 mg total) by mouth daily.      ibuprofen 800 MG tablet   Commonly known as: ADVIL,MOTRIN   Take 1 tablet (800 mg total) by mouth every 8 (eight) hours as needed for pain.      levofloxacin 750 MG tablet   Commonly known as: LEVAQUIN   Take 1 tablet (750 mg total) by mouth daily.      lisinopril 20 MG tablet   Commonly known as: PRINIVIL,ZESTRIL   Take 1 tablet (20 mg total) by mouth daily.      oxyCODONE 5 MG immediate release tablet   Commonly known as: Oxy IR/ROXICODONE   Take 1 tablet (5 mg total) by mouth every 4 (four) hours as needed for pain.     pantoprazole 40 MG tablet   Commonly known as: PROTONIX   Take 1 tablet (40 mg total) by mouth daily at 12 noon.      predniSONE 10 MG tablet   Commonly known as: DELTASONE   Take 1 tablet (10 mg total) by mouth 2 (two) times daily.             Disposition and Follow-up: The patient is being discharged home.  He is instructed to follow up with Dr. Charlestine Night and with Dr. Melvyn Novas in 1-2 weeks.   Medical Consults:  Dr. Christinia Gully, Pulmonology   Procedures and Diagnostic Studies:   Dg Chest 2 View 01/20/2012 IMPRESSION: No acute chest findings.  Original Report Authenticated By: Markus Daft, M.D.    Ct Angio Chest W/cm &/or Wo Cm 01/20/2012 IMPRESSION:  1.  Airspace opacity in the left lower lobe, potentially from pneumonia or pulmonary hemorrhage. 2.  No embolus is observed. 3.  Airway thickening with some mosaic attenuation in the lungs. There is an appearance resembling paraseptal emphysema, although this may be more a manifestation of faint fibrosis and mosaic attenuation from the bronchitis given that cystic lung  changes where an [place]. 4.  Stable 6 mm right middle lobe pulmonary nodule.  Original Report Authenticated By: Carron Curie, M.D.    Discharge Laboratory Values: Basic Metabolic Panel:  Lab [id number] 01/20/12 2055 01/20/12 0900  NA 138 -- 139  K 4.5 -- 4.2  CL 106 -- 106  CO2 23 -- 24  GLUCOSE 126* -- 78  BUN 14 -- 16  CREATININE 0.98 1.00 1.09  CALCIUM 8.8 -- 9.0  MG -- -- --  PHOS -- -- --   GFR Estimated Creatinine Clearance: 108.6 ml/min (by C-G formula based on Cr of 0.98). Liver Function Tests:  Lab 01/20/12 2055  AST 35  ALT 25  ALKPHOS 67  BILITOT 0.2*  PROT 7.8  ALBUMIN 3.1*    CBC:  Lab 01/21/12 0625 01/20/12 0900  WBC 5.8 8.1  NEUTROABS -- 4.8  HGB 12.6* 14.2  HCT 40.8 44.3  MCV 81.9 83.0  PLT 242 266   Microbiology Recent Results (from the past 240 hour(s))  CULTURE, SPUTUM-ASSESSMENT     Status: Normal   Collection  Time   01/22/12  8:17 AM      Component Value Range Status Comment   Specimen Description SPUTUM   Final    Special Requests Normal   Final    Sputum evaluation     Final    Value: THIS SPECIMEN IS ACCEPTABLE. RESPIRATORY CULTURE REPORT TO FOLLOW.   Report Status 01/22/2012 FINAL   Final      Brief H and P: For complete details please refer to admission H and P, but in brief, Jorge [name] is an 31 y.o. male with PMH of lupus diagnosed at age 89 who presented to the hospital with 12/10 chest pain, located in the right anterior chest for the past 2 days. He has not followed up with a physician or taken any medications to treat his Lupus for the past year.   Physical Exam at Discharge: BP 146/96  Pulse 87  Temp(Src) 98.4 F (36.9 C) (Oral)  Resp 18  Ht 5\' 8"  (1.727 m)  Wt 69.6 kg (153 lb 7 oz)  BMI 23.33 kg/m2  SpO2 100% Gen:  NAD Cardiovascular:  RRR, No M/R/G Respiratory: Lungs course with a few rhonchi Gastrointestinal: Abdomen soft, NT/ND with normal active bowel sounds. Extremities: No C/E/C  Principal Problem:  *Community acquired pneumonia  Admitted for IV antibiotics: Rocephin and azithromycin to start.  Dr. Melvyn Novas added Levaquin 01/21/12.  Sputum cultures pending.  HIV negative.  Continue Mucinex every 12 hours.  Continue Robitussin-DM as needed for breakthrough cough. Discharged on Ceftin, Azithromycin and Levaquin x 5 more days. Active Problems:  Lupus  Treated with Solumedrol, initially.  D/C home on steroid taper.  Maintenance dose 10 mg BID until follow up with Rheumatologist. Resume Plaquanil.   Outpatient followup with rheumatology and pulmonology. Pulmonary nodule, right  Stable on imaging. High blood pressure  No prior history of hypertension.  Norvasc started 01/21/12.  BP improved, but will need close follow-up. Hypoxia / H/O Asthma  Maintaining oxygen saturations at the present time.  Bronchodilators as needed. Pleuritic chest pain  Likely  pleurisy from pneumonia.  With history of pericarditis, pericardial inflammation may be contributory.  Ibuprofen every 8 hours.  Oxycodone as needed for breakthrough pain.    Recommendations for hospital follow-up: 1.  F/U with Dr. Charlestine Night in 1 week. 2.  F/U with Dr. Melvyn Novas in 2 weeks.  Diet:  Low sodium heart healthy.  Activity:  Increase activity slowly.  Condition at Discharge:   Improved.  Time spent on Discharge:  35 minutes.  Signed: Dr. Margreta Journey Eleanora Guinyard Pager [phone] 01/22/2012, 1:06 PM    Patient Teaching (information given to and reviewed with the patient):  Jorge [name] 01/22/2012 Hospitalist: Dr. Margreta Journey Cabela Pacifico  Medical Issues and plan: Principal Problem:  *Infection of the lungs (Community acquired pneumonia) On Rocephin, zithromycin, and Levaquin, d/c on oral antibiotics for another 5 days. Sputum cultures still pending.  HIV negative.  Continue Mucinex every 12 hours.  Continue Robitussin-DM as needed for breakthrough cough. Active Problems:  Lupus  Continue steroids.  Outpatient followup with rheumatology and pulmonology. Lung "knot" (nodule) Stable on imaging. High blood pressure  No prior history of high blood pressure  Norvasc started 01/21/12. Lisinopril added 01/22/12. BP 146-170 / 96-119 (goal less than 130 / 80) Low oxygen levels / Asthma Maintaining oxygen saturations at the present time.  Bronchodilators as needed. Chest pain  Likely from inflammation in the lungs.  Ibuprofen every 8 hours.  Oxycodone as needed for breakthrough pain.

## 2012-01-24 LAB — CULTURE, RESPIRATORY W GRAM STAIN: Culture: NORMAL

## 2012-01-30 ENCOUNTER — Ambulatory Visit (INDEPENDENT_AMBULATORY_CARE_PROVIDER_SITE_OTHER)
Admission: RE | Admit: 2012-01-30 | Discharge: 2012-01-30 | Disposition: A | Discharge status: Home / Self Care | Payer: Medicare Other | Source: Ambulatory Visit | Attending: Adult Health | Admitting: Adult Health

## 2012-01-30 ENCOUNTER — Ambulatory Visit (INDEPENDENT_AMBULATORY_CARE_PROVIDER_SITE_OTHER): Payer: Medicare Other | Admitting: Adult Health

## 2012-01-30 ENCOUNTER — Encounter: Payer: Self-pay | Admitting: Adult Health

## 2012-01-30 VITALS — BP 114/68 | HR 88 | Temp 97.8°F | Ht 67.0 in | Wt 157.6 lb

## 2012-01-30 DIAGNOSIS — J189 Pneumonia, unspecified organism: Secondary | ICD-10-CM

## 2012-01-30 DIAGNOSIS — J90 Pleural effusion, not elsewhere classified: Secondary | ICD-10-CM | POA: Diagnosis not present

## 2012-01-30 MED ORDER — BUDESONIDE-FORMOTEROL FUMARATE 80-4.5 MCG/ACT IN AERO: 2.0000 | INHALATION_SPRAY | Freq: Two times a day (BID) | RESPIRATORY_TRACT | Status: DC

## 2012-01-30 NOTE — Patient Instructions (Signed)
Begin Symbicort 80/4.65mcg 2 puffs Twice daily  -brush/rinse and gargle after use  follow up Dr. Joya Gaskins  In 3-4 weeks  I will call with xray results.

## 2012-01-30 NOTE — Assessment & Plan Note (Signed)
Recent admission with LLL opacity c/w lupus - complicated by asthma and underlying LUPUS - steroid dependent  Pt is clincially improving -will check cxr today .  Add Symbiocrt 80 2 puffs Twice daily   follow up in 3-4 weeks with Dr. Joya Gaskins

## 2012-01-30 NOTE — Progress Notes (Signed)
Subjective:    Patient ID: Jorge [name], male    DOB: Feb 16, 1981, 31 y.o.   MRN: [id number]  HPI 31 years old male with known hx of  bronchopulmonary dysplasia and AB , Lupus   08/18/08:  hosp 10/28 to 11/3 for pericarditis. Pos ANA 1:640 and RF pos 56 . High ESR. Small pericardial effusion on echo. Pt rx with steroids and colchicine. Pt also given 6 d of avelox. Now is better. No chest pain. Less cough. No fever. No joint pain but had this in hands before. On inhalers.  Has not seen [facility] before  .  September 30, 2009  Last seen 11/09:  Dyspnea ok recently. Notes more cough all night. Went to ED 12/20 with R side of facial swelling. Notes puffiness R side of face. Notes PNA vs Flu like illness. No CXR obtained Labs ok pt rx with augmentin and percocet for pain dx of parotiditis and augmentin was for same.  Saw [place] ?PNA . sleep pill given. No CXR has been done.  now mucous is yellow. No chest pain. Notes some wheezing   01/30/2012 [facility] follow up  Patient presents for a post hospital followup. Patient was admitted April 13 of 01/22/2012 for her community-acquired pneumonia. Patient has a known history of bronchopulmonary dysplasia and steroid dependent lupus. He has not been seen in our office in greater than 2.5 yrs. . Patient was admitted with a progressively worsening shortness of breath and pleuritic chest pain. He underwent a CT of the chest. That was negative for pulmonary emboli. CT showed airspace disease in LLL> a stable 6 mm right middle lobe pulmonary nodule. Patient was treated with aggressive IV antibiotics, steroids, and nebulized bronchodilators. He was discharged on Ceftin azithromycin and Levaquin for an additional 5 days. He was also discharged on prednisone taper. He was given anti-inflammatories and pain meds for pleuritic chest pain.  Since discharge. Patient is feeling improved with decreased cough and shortness of breath. Has finished all the abx. Currently on  prednisone 10mg  daily. -on chronic steroids .   Of note pt was started on HTN meds during hospital stay. He was started on an ACE - Lisinopril and Norvasc.       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  unemployed  Disability: for lungs     Review of Systems Constitutional:   No  weight loss, night sweats,  Fevers, chills,  +fatigue, or  lassitude.  HEENT:   No headaches,  Difficulty swallowing,  Tooth/dental problems, or  Sore throat,                No sneezing, itching, ear ache, nasal congestion, post nasal drip,   CV:  No chest pain,  Orthopnea, PND, swelling in lower extremities, anasarca, dizziness, palpitations, syncope.   GI  No heartburn, indigestion, abdominal pain, nausea, vomiting, diarrhea, change in bowel habits, loss of appetite, bloody stools.   Resp: No shortness of breath with exertion or at rest.  No excess mucus, no productive cough,  No non-productive cough,  No coughing up of blood.  No change in color of  mucus.  No wheezing.  No chest wall deformity  Skin: no rash or lesions.  GU: no dysuria, change in color of urine, no urgency or frequency.  No flank pain, no hematuria   MS:  No joint pain or swelling.  No decreased range of motion.  No back pain.  Psych:  No change in mood or affect. No depression or anxiety.  No memory loss.        Objective:   Physical Exam GEN: A/Ox3; pleasant , NAD, well nourished   HEENT:  [place]/AT,  EACs-clear, TMs-wnl, NOSE-clear, THROAT-clear, no lesions, no postnasal drip or exudate noted.   NECK:  Supple w/ fair ROM; no JVD; normal carotid impulses w/o bruits; no thyromegaly or  nodules palpated; no lymphadenopathy.  RESP  Clear  P & A; w/o, wheezes/ rales/ or rhonchi.no accessory muscle use, no dullness to percussion  CARD:  RRR, no m/r/g  , no peripheral edema, pulses intact, no cyanosis or clubbing.  GI:   Soft & nt; nml bowel sounds; no organomegaly or masses detected.  Musco: Warm bil, no deformities or joint swelling noted.   Neuro: alert, no focal deficits noted.    Skin: Warm, no lesions or rashes         Assessment & Plan:

## 2012-01-30 NOTE — Progress Notes (Signed)
Addended by: Parke Poisson E on: 01/30/2012 04:10 PM   Modules accepted: Orders

## 2012-02-02 ENCOUNTER — Other Ambulatory Visit: Payer: Self-pay | Admitting: Critical Care Medicine

## 2012-02-02 DIAGNOSIS — J189 Pneumonia, unspecified organism: Secondary | ICD-10-CM

## 2012-02-22 ENCOUNTER — Other Ambulatory Visit (HOSPITAL_COMMUNITY): Payer: Self-pay | Admitting: Internal Medicine

## 2012-03-18 ENCOUNTER — Encounter: Payer: Self-pay | Admitting: Critical Care Medicine

## 2012-03-18 ENCOUNTER — Ambulatory Visit (INDEPENDENT_AMBULATORY_CARE_PROVIDER_SITE_OTHER): Payer: Medicare Other | Admitting: Critical Care Medicine

## 2012-03-18 VITALS — BP 122/88 | HR 105 | Temp 98.4°F | Ht 68.0 in | Wt 153.0 lb

## 2012-03-18 DIAGNOSIS — J45909 Unspecified asthma, uncomplicated: Secondary | ICD-10-CM

## 2012-03-18 DIAGNOSIS — I10 Essential (primary) hypertension: Secondary | ICD-10-CM | POA: Diagnosis not present

## 2012-03-18 MED ORDER — BUDESONIDE-FORMOTEROL FUMARATE 80-4.5 MCG/ACT IN AERO: 2.0000 | INHALATION_SPRAY | Freq: Two times a day (BID) | RESPIRATORY_TRACT | Status: DC

## 2012-03-18 NOTE — Patient Instructions (Addendum)
Resume symbicort two puff twice daily Stay on lisinopril Stay off amlodipine Stay off prednisone Stay off antibiotics Use melatonin 4mg  at bedtime for sleep Return 4 months

## 2012-03-18 NOTE — Progress Notes (Signed)
Subjective:    Patient ID: Jorge [name], male    DOB: August 06, 1981, 31 y.o.   MRN: [id number]  HPI  31 y.o.  male with known hx of  bronchopulmonary dysplasia and AB , Lupus   4/23 [facility] follow up  Patient presents for a post hospital followup. Patient was admitted April 13 of 01/22/2012 for her community-acquired pneumonia. Patient has a known history of bronchopulmonary dysplasia and steroid dependent lupus. He has not been seen in our office in greater than 2.5 yrs. . Patient was admitted with a progressively worsening shortness of breath and pleuritic chest pain. He underwent a CT of the chest. That was negative for pulmonary emboli. CT showed airspace disease in LLL> a stable 6 mm right middle lobe pulmonary nodule. Patient was treated with aggressive IV antibiotics, steroids, and nebulized bronchodilators. He was discharged on Ceftin azithromycin and Levaquin for an additional 5 days. He was also discharged on prednisone taper. He was given anti-inflammatories and pain meds for pleuritic chest pain.  Since discharge. Patient is feeling improved with decreased cough and shortness of breath. Has finished all the abx. Currently on prednisone 10mg  daily. -on chronic steroids .   Of note pt was started on HTN meds during hospital stay. He was started on an ACE - Lisinopril and Norvasc.   03/18/2012 Dyspnea is better. Occ cough but not as congested.  No real wheeze.  No chest pain.   Pt denies any significant sore throat, nasal congestion or excess secretions, fever, chills, sweats, unintended weight loss, pleurtic or exertional chest pain, orthopnea PND, or leg swelling Pt denies any increase in rescue therapy over baseline, denies waking up needing it or having any early am or nocturnal exacerbations of coughing/wheezing/or dyspnea. Pt also denies any obvious fluctuation in symptoms with  weather or environmental change or other alleviating or aggravating factors      Review of  Systems  Constitutional:   No  weight loss, night sweats,  Fevers, chills,  +fatigue, or  lassitude.  HEENT:   No headaches,  Difficulty swallowing,  Tooth/dental problems, or  Sore throat,                No sneezing, itching, ear ache, nasal congestion, post nasal drip,   CV:  No chest pain,  Orthopnea, PND, swelling in lower extremities, anasarca, dizziness, palpitations, syncope.   GI  No heartburn, indigestion, abdominal pain, nausea, vomiting, diarrhea, change in bowel habits, loss of appetite, bloody stools.   Resp: No shortness of breath with exertion or at rest.  No excess mucus, no productive cough,  No non-productive cough,  No coughing up of blood.  No change in color of mucus.  No wheezing.  No chest wall deformity  Skin: no rash or lesions.  GU: no dysuria, change in color of urine, no urgency or frequency.  No flank pain, no hematuria   MS:  No joint pain or swelling.  No decreased range of motion.  No back pain.  Psych:  No change in mood or affect. No depression or anxiety.  No memory loss.        Objective:   Physical Exam BP 122/88  Pulse 105  Temp(Src) 98.4 F (36.9 C) (Oral)  Ht 5\' 8"  (1.727 m)  Wt 153 lb (69.4 kg)  BMI 23.26 kg/m2  SpO2 97%  GEN: A/Ox3; pleasant , NAD, well nourished   HEENT:  [place]/AT,  EACs-clear, TMs-wnl, NOSE-clear, THROAT-clear, no lesions, no postnasal drip or exudate noted.  NECK:  Supple w/ fair ROM; no JVD; normal carotid impulses w/o bruits; no thyromegaly or nodules palpated; no lymphadenopathy.  RESP  Clear  P & A; w/o, wheezes/ rales/ or rhonchi.no accessory muscle use, no dullness to percussion  CARD:  RRR, no m/r/g  , no peripheral edema, pulses intact, no cyanosis or clubbing.  GI:   Soft & nt; nml bowel sounds; no organomegaly or masses detected.  Musco: Warm bil, no deformities or joint swelling noted.   Neuro: alert, no focal deficits noted.    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 guaiFENesin (MUCINEX) 600 MG 12 hr tablet Take 1 tablet (600 mg total) by mouth 2 (two) times daily.  30 tablet  0  . hydroxychloroquine (PLAQUENIL) 200 MG tablet Take 2 tablets (400 mg total) by mouth daily.  120 tablet  3  . lisinopril (PRINIVIL,ZESTRIL) 20 MG tablet Take 1 tablet (20 mg total) by mouth daily.  30 tablet  3  . budesonide-formoterol (SYMBICORT) 80-4.5 MCG/ACT inhaler Inhale 2 puffs into the lungs 2 (two) times daily.  1 Inhaler  12  . DISCONTD: albuterol (PROVENTIL HFA;VENTOLIN HFA) 108 (90 BASE) MCG/ACT inhaler Inhale 2 puffs into the lungs every 6 (six) hours.  1 Inhaler  3  . DISCONTD: amLODipine (NORVASC) 10 MG tablet Take 1 tablet (10 mg total) by mouth daily.  30 tablet  3  . DISCONTD: budesonide-formoterol (SYMBICORT) 80-4.5 MCG/ACT inhaler Inhale 2 puffs into the lungs 2 (two) times daily.  1 Inhaler  12  . DISCONTD: pantoprazole (PROTONIX) 40 MG tablet Take 1 tablet (40 mg total) by mouth daily at 12 noon.  30 tablet  0  . DISCONTD: predniSONE (DELTASONE) 10 MG tablet Take 1 tablet (10 mg total) by mouth 2 (two) times daily.  80 tablet  2

## 2012-03-19 NOTE — Assessment & Plan Note (Signed)
History of hypertension documented during recent hospitalization now improved Plan Stay on lisinopril Stay off amlodipine Patient needs to obtain a primary care physician

## 2012-03-19 NOTE — Assessment & Plan Note (Signed)
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

## 2012-04-02 ENCOUNTER — Emergency Department (HOSPITAL_COMMUNITY)
Admission: EM | Admit: 2012-04-02 | Discharge: 2012-04-02 | Disposition: A | Discharge status: Home / Self Care | Payer: Medicare Other | Attending: Emergency Medicine | Admitting: Emergency Medicine

## 2012-04-02 ENCOUNTER — Encounter (HOSPITAL_COMMUNITY): Payer: Self-pay | Admitting: *Deleted

## 2012-04-02 ENCOUNTER — Emergency Department (HOSPITAL_COMMUNITY): Payer: Medicare Other

## 2012-04-02 DIAGNOSIS — J45909 Unspecified asthma, uncomplicated: Secondary | ICD-10-CM | POA: Insufficient documentation

## 2012-04-02 DIAGNOSIS — I10 Essential (primary) hypertension: Secondary | ICD-10-CM | POA: Insufficient documentation

## 2012-04-02 DIAGNOSIS — R52 Pain, unspecified: Secondary | ICD-10-CM | POA: Diagnosis not present

## 2012-04-02 DIAGNOSIS — Z79899 Other long term (current) drug therapy: Secondary | ICD-10-CM | POA: Diagnosis not present

## 2012-04-02 DIAGNOSIS — M329 Systemic lupus erythematosus, unspecified: Secondary | ICD-10-CM | POA: Diagnosis not present

## 2012-04-02 DIAGNOSIS — M7989 Other specified soft tissue disorders: Secondary | ICD-10-CM | POA: Diagnosis not present

## 2012-04-02 DIAGNOSIS — M25539 Pain in unspecified wrist: Secondary | ICD-10-CM | POA: Diagnosis not present

## 2012-04-02 HISTORY — DX: Essential (primary) hypertension: I10

## 2012-04-02 MED ORDER — IBUPROFEN 200 MG PO TABS
600.0000 mg | ORAL_TABLET | Freq: Once | ORAL | Status: AC
  Administered 2012-04-02: 600 mg via ORAL
  Filled 2012-04-02: qty 2
  Filled 2012-04-02: qty 1

## 2012-04-02 MED ORDER — IBUPROFEN 600 MG PO TABS: 600.0000 mg | ORAL_TABLET | Freq: Four times a day (QID) | ORAL | Status: DC | PRN

## 2012-04-02 MED ORDER — HYDROCODONE-ACETAMINOPHEN 5-500 MG PO TABS: 1.0000 | ORAL_TABLET | Freq: Four times a day (QID) | ORAL | Status: DC | PRN

## 2012-04-02 MED ORDER — HYDROCODONE-ACETAMINOPHEN 5-500 MG PO TABS: 1.0000 | ORAL_TABLET | Freq: Four times a day (QID) | ORAL | Status: AC | PRN

## 2012-04-02 MED ORDER — HYDROCODONE-ACETAMINOPHEN 5-325 MG PO TABS
1.0000 | ORAL_TABLET | Freq: Once | ORAL | Status: AC
  Administered 2012-04-02: 1 via ORAL
  Filled 2012-04-02: qty 1

## 2012-04-02 MED ORDER — PREDNISONE 20 MG PO TABS: ORAL_TABLET | ORAL | Status: AC

## 2012-04-02 NOTE — ED Notes (Signed)
MD at bedside. 

## 2012-04-02 NOTE — Discharge Instructions (Signed)
May wear wrist splint for comfort/support for the next few days. Take prednisone as prescribed.  You may also take vicodin as need for pain. No driving for the next 6 hours or when taking vicodin. Also, do not take tylenol or acetaminophen containing medication when taking vicodin.  Follow up with primary care doctor in the next few days for recheck.  Return to ER if worse, fevers, increased swelling, fevers, spreading redness, other concern.

## 2012-04-02 NOTE — ED Notes (Signed)
Per EMS, pt from home with reports of left wrist pain that started yesterday evening. Per EMS, pt also reports swelling that began this morning but EMS reports no deformity or reports of trauma. EMS states that pt endorses hx of Lupus.

## 2012-04-02 NOTE — ED Provider Notes (Signed)
History     CSN: [id number]  Arrival date & time 04/02/12  0919   First MD Initiated Contact with Patient 04/02/12 [phone]      Chief Complaint  Patient presents with  . Wrist Pain    left    (Consider location/radiation/quality/duration/timing/severity/associated sxs/prior treatment) Patient is a 31 y.o. male presenting with wrist pain. The history is provided by the patient.  Wrist Pain Pertinent negatives include no shortness of breath.  pt c/o left wrist pain for past day, esp in area distal ulnar/lunate/pisiform/triquetram dorsally. Constant, dull, non radiating. Worse w palpation area and movement wrist. No skin changes or erythema. No hand numbness or loss of function. No elbow or shoulder pain. No fever or chills. Hx lupus, notes has had various joint pains in past but not specifically in wrist. Is right hand dominant. Denies any repetitive use injury of wrist, not currently employed. No hx cts. Denies fever or chills. States does not feel ill. Denies recent lupus flare. Denies hx gout. No injury/trauma.   Past Medical History  Diagnosis Date  . Lupus   . Asthma   . Bronchopulmonary dysplasia   . History of pericarditis   . PAROTITIS, RIGHT 09/30/2009    Qualifier: Diagnosis of  By: Joya Gaskins MD, Burnett Harry   . Hypertension     Past Surgical History  Procedure Date  . Tonsillectomy   . Wisdom tooth extraction     Family History  Problem Relation Age of Onset  . Lupus Father   . HIV Brother     History  Substance Use Topics  . Smoking status: Never Smoker   . Smokeless tobacco: Never Used  . Alcohol Use: Yes     Occasional wine      Review of Systems  Constitutional: Negative for fever and chills.  Respiratory: Negative for shortness of breath.   Gastrointestinal: Negative for nausea and vomiting.  Musculoskeletal: Negative for myalgias.  Skin: Negative for rash.  Neurological: Negative for weakness and numbness.    Allergies  Review of patient's  allergies indicates no known allergies.  Home Medications   Current Outpatient Rx  Name Route Sig Dispense Refill  . BUDESONIDE-FORMOTEROL FUMARATE 80-4.5 MCG/ACT IN AERO Inhalation Inhale 2 puffs into the lungs 2 (two) times daily. 1 Inhaler 12  . GUAIFENESIN ER 600 MG PO TB12 Oral Take 1 tablet (600 mg total) by mouth 2 (two) times daily. 30 tablet 0  . HYDROXYCHLOROQUINE SULFATE 200 MG PO TABS Oral Take 2 tablets (400 mg total) by mouth daily. 120 tablet 3  . LISINOPRIL 20 MG PO TABS Oral Take 1 tablet (20 mg total) by mouth daily. 30 tablet 3    BP 140/90  Pulse 100  Physical Exam  Nursing note and vitals reviewed. Constitutional: He is oriented to person, place, and time. He appears well-developed and well-nourished. No distress.  HENT:  Head: Atraumatic.  Eyes: Conjunctivae are normal.  Neck: Neck supple. No tracheal deviation present.  Cardiovascular: Normal rate.   Pulmonary/Chest: Effort normal. No accessory muscle usage. No respiratory distress.  Abdominal: He exhibits no distension.  Musculoskeletal: Normal range of motion.       Tenderness over left distal radius and proximal carpal bones left wrist dorsally. Radial pulse 2+. Normal cap refill distally in digits.  R/m/u nerve function intact. Good passive rom at wrist w mild discomfort. No skin changes or erythema. No inc warmth. No epitroch or axillary l/a.   Neurological: He is alert and oriented to person,  place, and time.  Skin: Skin is warm and dry.  Psychiatric: He has a normal mood and affect.    ED Course  Procedures (including critical care time)  Dg Wrist Complete Left  04/02/2012  *RADIOLOGY REPORT*  Clinical Data: Left wrist pain.  LEFT WRIST - COMPLETE 3+ VIEW  Comparison: 10/14/2007  Findings: Soft tissue swelling over the dorsum of the wrist.  Small cyst noted within the capitate.  No fracture, subluxation or dislocation.  IMPRESSION: No acute bony abnormality.  Original Report Authenticated By: Raelyn Number, M.D.      MDM  Pt has ride, does not have to drive. Motrin po. vicodin po.  Xrays.   ?inflamm arthritis to wrist. Will splint, rx pain.       Mirna Mires, MD 04/02/12 1029

## 2012-04-06 ENCOUNTER — Encounter (HOSPITAL_COMMUNITY): Payer: Self-pay | Admitting: *Deleted

## 2012-04-06 ENCOUNTER — Emergency Department (HOSPITAL_COMMUNITY)
Admission: EM | Admit: 2012-04-06 | Discharge: 2012-04-07 | Disposition: A | Discharge status: Home / Self Care | Payer: Medicare Other | Attending: Emergency Medicine | Admitting: Emergency Medicine

## 2012-04-06 DIAGNOSIS — J45909 Unspecified asthma, uncomplicated: Secondary | ICD-10-CM | POA: Insufficient documentation

## 2012-04-06 DIAGNOSIS — Z79899 Other long term (current) drug therapy: Secondary | ICD-10-CM | POA: Diagnosis not present

## 2012-04-06 DIAGNOSIS — M329 Systemic lupus erythematosus, unspecified: Secondary | ICD-10-CM

## 2012-04-06 DIAGNOSIS — M79609 Pain in unspecified limb: Secondary | ICD-10-CM | POA: Diagnosis not present

## 2012-04-06 DIAGNOSIS — M25579 Pain in unspecified ankle and joints of unspecified foot: Secondary | ICD-10-CM | POA: Diagnosis not present

## 2012-04-06 MED ORDER — MORPHINE SULFATE 4 MG/ML IJ SOLN
4.0000 mg | Freq: Once | INTRAMUSCULAR | Status: AC
  Administered 2012-04-06: 4 mg via INTRAMUSCULAR
  Filled 2012-04-06: qty 1

## 2012-04-06 MED ORDER — OXYCODONE-ACETAMINOPHEN 5-325 MG PO TABS: 1.0000 | ORAL_TABLET | Freq: Four times a day (QID) | ORAL | Status: AC | PRN

## 2012-04-06 MED ORDER — ONDANSETRON 4 MG PO TBDP
4.0000 mg | ORAL_TABLET | Freq: Once | ORAL | Status: AC
  Administered 2012-04-06: 4 mg via ORAL
  Filled 2012-04-06: qty 1

## 2012-04-06 MED ORDER — ONDANSETRON HCL 4 MG PO TABS: 4.0000 mg | ORAL_TABLET | Freq: Four times a day (QID) | ORAL | Status: AC

## 2012-04-06 NOTE — ED Provider Notes (Signed)
History     CSN: [id number]  Arrival date & time 04/06/12  1635   First MD Initiated Contact with Patient 04/06/12 2233      Chief Complaint  Patient presents with  . Foot Pain    left    (Consider location/radiation/quality/duration/timing/severity/associated sxs/prior treatment) HPI  Patient presents to the ED with complaints of left foot pain for 1 day. He has a PMH of Lupus for which he is waiting to see a specialist and has an appointment scheduled. He has seen his PCP recently and was fine at that point. The patient was recently sick with pneumonia and Lupus that affected his heart, per patient. Today he admits that only his right ankle is hurting for 1 day. He states that the pain is severe. He has been taking Vicodin for wrist pain but it is not working. The patient denies any chest pain, shortness of breath, fever, or any other concerns at this time.  Past Medical History  Diagnosis Date  . Lupus   . Asthma   . Bronchopulmonary dysplasia   . History of pericarditis   . PAROTITIS, RIGHT 09/30/2009    Qualifier: Diagnosis of  By: Joya Gaskins MD, Burnett Harry   . Hypertension     Past Surgical History  Procedure Date  . Tonsillectomy   . Wisdom tooth extraction     Family History  Problem Relation Age of Onset  . Lupus Father   . HIV Brother     History  Substance Use Topics  . Smoking status: Never Smoker   . Smokeless tobacco: Never Used  . Alcohol Use: Yes     Occasional wine      Review of Systems   HEENT: denies blurry vision or change in hearing PULMONARY: Denies difficulty breathing and SOB CARDIAC: denies chest pain or heart palpitations MUSCULOSKELETAL:  denies being unable to ambulate due to pain ABDOMEN AL: denies abdominal pain GU: denies loss of bowel or urinary control NEURO: denies numbness and tingling in extremities SKIN: no new rashes PSYCH: patient behavior is normal NECK: Not complaining of neck pain     Allergies  Review of  patient's allergies indicates no known allergies.  Home Medications   Current Outpatient Rx  Name Route Sig Dispense Refill  . BUDESONIDE-FORMOTEROL FUMARATE 80-4.5 MCG/ACT IN AERO Inhalation Inhale 2 puffs into the lungs 2 (two) times daily. 1 Inhaler 12  . CALCIUM 500 PO Oral Take 750 mg by mouth daily.    Marland Kitchen HYDROCODONE-ACETAMINOPHEN 5-500 MG PO TABS Oral Take 1-2 tablets by mouth every 6 (six) hours as needed for pain. 20 tablet 0  . HYDROXYCHLOROQUINE SULFATE 200 MG PO TABS Oral Take 2 tablets (400 mg total) by mouth daily. 120 tablet 3  . LISINOPRIL 20 MG PO TABS Oral Take 1 tablet (20 mg total) by mouth daily. 30 tablet 3  . PANTOPRAZOLE SODIUM 40 MG PO TBEC Oral Take 40 mg by mouth daily.    Marland Kitchen PREDNISONE 20 MG PO TABS  3 po once a day for 3 days, then 2 po once a day for 3 days, then 1 po once a day for 3 days 18 tablet 0  . ZOLPIDEM TARTRATE 10 MG PO TABS Oral Take 10 mg by mouth at bedtime as needed.    Marland Kitchen ONDANSETRON HCL 4 MG PO TABS Oral Take 1 tablet (4 mg total) by mouth every 6 (six) hours. 12 tablet 0  . OXYCODONE-ACETAMINOPHEN 5-325 MG PO TABS Oral Take 1 tablet by  mouth every 6 (six) hours as needed for pain. 15 tablet 0    BP 153/95  Pulse 98  Temp 98.7 F (37.1 C) (Oral)  Resp 20  Wt 153 lb (69.4 kg)  SpO2 99%  Physical Exam  Nursing note and vitals reviewed. Constitutional: He is oriented to person, place, and time. He appears well-developed and well-nourished. No distress.  HENT:  Head: Normocephalic and atraumatic.  Right Ear: Tympanic membrane, external ear and ear canal normal.  Left Ear: Tympanic membrane, external ear and ear canal normal.  Nose: Nose normal. No rhinorrhea. Right sinus exhibits no maxillary sinus tenderness and no frontal sinus tenderness. Left sinus exhibits no maxillary sinus tenderness and no frontal sinus tenderness.  Eyes: Conjunctivae are normal.  Neck: Trachea normal, normal range of motion and full passive range of motion without  pain. Neck supple. No Brudzinski's sign noted.       .  Cardiovascular: Normal rate and regular rhythm.   Pulmonary/Chest: Effort normal. He has no wheezes.  Abdominal: Soft.  Musculoskeletal:       Left ankle: He exhibits decreased range of motion and swelling. He exhibits no ecchymosis, no deformity, no laceration and normal pulse. tenderness. Lateral malleolus and medial malleolus tenderness found. No AITFL, no CF ligament, no posterior TFL, no head of 5th metatarsal and no proximal fibula tenderness found. Achilles tendon normal.  Neurological: He is alert and oriented to person, place, and time.  Skin: Skin is warm and dry. He is not diaphoretic.  Psychiatric: He has a normal mood and affect.    ED Course  Procedures (including critical care time)  Labs Reviewed - No data to display No results found.   1. Pain, joint, ankle   2. Lupus       MDM  And is having a lupus flare that has localized to the left ankle. He's not having any systemic symptoms of fevers, chills, weakness, abdominal pain, chest pain, shortness of breath. He has tried Vicodin at home and has not relieved the pain. The pain has been for one day. I have prescribed the patient Percocets and Zofran. And advised him to call his primary care Dr. on Monday. At this time I do not see any alarm symptoms. His joint is not hot, indurated, pulses are equal bilaterally. Skin color is normal.  .rred         Linus Mako, PA 04/06/12 2314

## 2012-04-06 NOTE — Discharge Instructions (Signed)
Arthralgia Your caregiver has diagnosed you as suffering from an arthralgia. Arthralgia means there is pain in a joint. This can come from many reasons including:  Bruising the joint which causes soreness (inflammation) in the joint.   Wear and tear on the joints which occur as we grow older (osteoarthritis).   Overusing the joint.   Various forms of arthritis.   Infections of the joint.  Regardless of the cause of pain in your joint, most of these different pains respond to anti-inflammatory drugs and rest. The exception to this is when a joint is infected, and these cases are treated with antibiotics, if it is a bacterial infection. HOME CARE INSTRUCTIONS   Rest the injured area for as long as directed by your caregiver. Then slowly start using the joint as directed by your caregiver and as the pain allows. Crutches as directed may be useful if the ankles, knees or hips are involved. If the knee was splinted or casted, continue use and care as directed. If an stretchy or elastic wrapping bandage has been applied today, it should be removed and re-applied every 3 to 4 hours. It should not be applied tightly, but firmly enough to keep swelling down. Watch toes and feet for swelling, bluish discoloration, coldness, numbness or excessive pain. If any of these problems (symptoms) occur, remove the ace bandage and re-apply more loosely. If these symptoms persist, contact your caregiver or return to this location.   For the first 24 hours, keep the injured extremity elevated on pillows while lying down.   Apply ice for 15 to 20 minutes to the sore joint every couple hours while awake for the first half day. Then 3 to 4 times per day for the first 48 hours. Put the ice in a plastic bag and place a towel between the bag of ice and your skin.   Wear any splinting, casting, elastic bandage applications, or slings as instructed.   Only take over-the-counter or prescription medicines for pain,  discomfort, or fever as directed by your caregiver. Do not use aspirin immediately after the injury unless instructed by your physician. Aspirin can cause increased bleeding and bruising of the tissues.   If you were given crutches, continue to use them as instructed and do not resume weight bearing on the sore joint until instructed.  Persistent pain and inability to use the sore joint as directed for more than 2 to 3 days are warning signs indicating that you should see a caregiver for a follow-up visit as soon as possible. Initially, a hairline fracture (break in bone) may not be evident on X-rays. Persistent pain and swelling indicate that further evaluation, non-weight bearing or use of the joint (use of crutches or slings as instructed), or further X-rays are indicated. X-rays may sometimes not show a small fracture until a week or 10 days later. Make a follow-up appointment with your own caregiver or one to whom we have referred you. A radiologist (specialist in reading X-rays) may read your X-rays. Make sure you know how you are to obtain your X-ray results. Do not assume everything is normal if you do not hear from us. SEEK MEDICAL CARE IF: Bruising, swelling, or pain increases. SEEK IMMEDIATE MEDICAL CARE IF:   Your fingers or toes are numb or blue.   The pain is not responding to medications and continues to stay the same or get worse.   The pain in your joint becomes severe.   You develop a fever over   102 F (38.9 C).   It becomes impossible to move or use the joint.  MAKE SURE YOU:   Understand these instructions.   Will watch your condition.   Will get help right away if you are not doing well or get worse.  Document Released: 09/25/2005 Document Revised: 09/14/2011 Document Reviewed: 05/13/2008 [facility] Patient Information 2012 [place].  Lupus Lupus (also called systemic lupus erythematosus, SLE) is a disorder of the body's natural defense system (immune system).  In lupus, the immune system attacks various areas of the body (autoimmune disease). CAUSES The cause is unknown. However, lupus runs in families. Certain genes can make you more likely to develop lupus. It is 10 times more common in women than in men. Lupus is also more common in African Americans and Asians. Other factors also play a role, such as viruses (Epstein-Barr virus, EBV), stress, hormones, cigarette smoke, and certain drugs. SYMPTOMS Lupus can affect many parts of the body, including the joints, skin, kidneys, lungs, heart, nervous system, and blood vessels. The signs and symptoms of lupus differ from person to person. The disease can range from mild to life-threatening. Typical features of lupus include:  Butterfly-shaped rash over the face.   Arthritis involving one or more joints.   Kidney disease.   Fever, weight loss, hair loss, fatigue.   Poor circulation in the fingers and toes (Raynaud's disease).   Chest pain when taking deep breaths. Abdominal pain may also occur.   Skin rash in areas exposed to the sun.   Sores in the mouth and nose.  DIAGNOSIS Diagnosing lupus can take a long time and is often difficult. An exam and an accurate account of your symptoms and health problems is very important. Blood tests are necessary, though no single test can confirm or rule out lupus. Most people with lupus test positive for antinuclear antibodies (ANA) on a blood test. Additional blood tests, a urine test (urinalysis), and sometimes a kidney or skin tissue sample (biopsy) can help to confirm or rule out lupus. TREATMENT There is no cure for lupus. Your caregiver will develop a treatment plan based on your age, sex, health, symptoms, and lifestyle. The goals are to prevent flares, to treat them when they do occur, and to minimize organ damage and complications. How the disease may affect each person varies widely. Most people with lupus can live normal lives, but this disorder must be  carefully monitored. Treatment must be adjusted as necessary to prevent serious complications. Medicines used for treatment:  Nonsteroidal anti-inflammatory drugs (NSAIDs) decrease inflammation and can help with chest pain, joint pain, and fevers. Examples include ibuprofen and naproxen.   Antimalarial drugs were designed to treat malaria. They also treat fatigue, joint pain, skin rashes, and inflammation of the lungs in patients with lupus.   Corticosteroids are powerful hormones that rapidly suppress inflammation. The lowest dose with the highest benefit will be chosen. They can be given by cream, pills, injections, and through the vein (intravenously).   Immunosuppressive drugs block the making of immune cells. They may be used for kidney or nerve disease.  HOME CARE INSTRUCTIONS  Exercise. Low-impact activities can usually help keep joints flexible without being too strenuous.   Rest after periods of exercise.   Avoid excessive sun exposure.   Follow proper nutrition and take supplements as recommended by your caregiver.   Stress management can be helpful.  SEEK MEDICAL CARE IF:  You have increased fatigue.   You develop pain.   You  develop a rash.   You have an oral temperature above 102 F (38.9 C).   You develop abdominal discomfort.   You develop a headache.   You experience dizziness.  [place] of Neurological Disorders and Stroke: [employer] of Rheumatology: www.rheumatology.[facility] of Arthritis and Musculoskeletal and Skin Diseases: www.niams.SouthExposed.es Document Released: 09/15/2002 Document Revised: 09/14/2011 Document Reviewed: 01/06/2010 [facility] Patient Information 2012 [place].  RESOURCE GUIDE  Dental Problems  Patients with Medicaid: [place] [employer] Phone:  [phone]                                                  Phone:  [phone]  If unable to pay or uninsured, contact:  [facility] or [facility]. to become qualified for the adult dental clinic.  Chronic Pain Problems Contact [facility]  [phone] Patients need to be referred by their primary care doctor.  Insufficient Money for Medicine Contact [employer]:  call "211" or [place] [phone].  No Primary Care Doctor Call [facility]  [phone] Other agencies that provide inexpensive medical care    [place]  [phone]    [facility]  [place]  [phone]    [facility]  [phone]    [facility]  [place]  [place]  [phone] [place]   [phone] (emergency services [phone])  Substance Abuse [facility]  [phone] [facility] [phone] [facility] [phone] [place] [place] [phone] Residential & Outpatient Substance Abuse Program  [phone]  Abuse/Neglect [place] [phone] [place] [phone] (After Hours)  Emergency [place] [phone]  [place] [facility] [place] [phone] [place] [phone]  MRSA Hotline #:   506 876 4597    [facility] [address]                       [address]      [address] [place]  [facility] Phone:  [phone]                                   Phone:  [phone]                  Phone:  [place] Phone:  [place] [phone] (After Hours)

## 2012-04-06 NOTE — ED Provider Notes (Signed)
Medical screening examination/treatment/procedure(s) were performed by non-physician practitioner and as supervising physician I was immediately available for consultation/collaboration.    Kathalene Frames, MD 04/06/12 [phone]

## 2012-04-06 NOTE — ED Notes (Signed)
Patient reports to taking 2 tabs of hydrocodone every 6 hours the past day and a half with no relief, He denies taking 11 pills in the past 12 hours. He is aware of the tylenol levels.

## 2012-04-06 NOTE — ED Notes (Signed)
AC:[id number] Expected date:04/06/12<BR> Expected time: 4:20 PM<BR> Means of arrival:Ambulance<BR> Comments:<BR> M53. 30 m. Pain, especially feet pain. Hx of Lupus. 15 mins

## 2012-04-06 NOTE — ED Notes (Signed)
Pt from home, brought in by EMS with reports of left foot for 1 day "form Lupus flare up". Per EMS, pt reports taking 11 Hydrocodone over the past 12 hours with no relief.

## 2012-05-20 ENCOUNTER — Encounter (HOSPITAL_COMMUNITY): Payer: Self-pay | Admitting: Emergency Medicine

## 2012-05-20 ENCOUNTER — Emergency Department (HOSPITAL_COMMUNITY)
Admission: EM | Admit: 2012-05-20 | Discharge: 2012-05-20 | Disposition: A | Discharge status: Home / Self Care | Payer: Medicare Other | Attending: Emergency Medicine | Admitting: Emergency Medicine

## 2012-05-20 DIAGNOSIS — L03119 Cellulitis of unspecified part of limb: Secondary | ICD-10-CM | POA: Insufficient documentation

## 2012-05-20 DIAGNOSIS — J45909 Unspecified asthma, uncomplicated: Secondary | ICD-10-CM | POA: Diagnosis not present

## 2012-05-20 DIAGNOSIS — M329 Systemic lupus erythematosus, unspecified: Secondary | ICD-10-CM | POA: Insufficient documentation

## 2012-05-20 DIAGNOSIS — L0291 Cutaneous abscess, unspecified: Secondary | ICD-10-CM

## 2012-05-20 DIAGNOSIS — L02419 Cutaneous abscess of limb, unspecified: Secondary | ICD-10-CM | POA: Diagnosis not present

## 2012-05-20 DIAGNOSIS — R52 Pain, unspecified: Secondary | ICD-10-CM | POA: Diagnosis not present

## 2012-05-20 DIAGNOSIS — I10 Essential (primary) hypertension: Secondary | ICD-10-CM | POA: Insufficient documentation

## 2012-05-20 DIAGNOSIS — M79609 Pain in unspecified limb: Secondary | ICD-10-CM | POA: Diagnosis not present

## 2012-05-20 MED ORDER — CEPHALEXIN 500 MG PO CAPS: 500.0000 mg | ORAL_CAPSULE | Freq: Four times a day (QID) | ORAL | Status: DC

## 2012-05-20 MED ORDER — TRAMADOL HCL 50 MG PO TABS: 50.0000 mg | ORAL_TABLET | Freq: Four times a day (QID) | ORAL | Status: DC | PRN

## 2012-05-20 MED ORDER — SULFAMETHOXAZOLE-TRIMETHOPRIM 800-160 MG PO TABS: 1.0000 | ORAL_TABLET | Freq: Two times a day (BID) | ORAL | Status: DC

## 2012-05-20 MED ORDER — OXYCODONE-ACETAMINOPHEN 5-325 MG PO TABS
2.0000 | ORAL_TABLET | Freq: Once | ORAL | Status: AC
  Administered 2012-05-20: 2 via ORAL
  Filled 2012-05-20: qty 2

## 2012-05-20 NOTE — ED Notes (Signed)
Pt states he bean seeing these boils on his thighs about fourdays ago. Pt has a cousin and boyfriend that have had recent MRSA infections.

## 2012-05-20 NOTE — ED Provider Notes (Signed)
History     CSN: [id number]  Arrival date & time 05/20/12  0409   First MD Initiated Contact with Patient 05/20/12 [phone]      Chief Complaint  Patient presents with  . Abscess    (Consider location/radiation/quality/duration/timing/severity/associated sxs/prior treatment) HPI  31 y.o. male in no acute distress brought in by EMS for abscess is to bilateral thighs worsening over the course of 3 days. Patient is afebrile, with no nausea or vomiting. Has never had a prior episode. Has contacts that are MRSA positive. Pain is severe at 10 out of 10 worsened by pressure.   Past Medical History  Diagnosis Date  . Lupus   . Asthma   . Bronchopulmonary dysplasia   . History of pericarditis   . PAROTITIS, RIGHT 09/30/2009    Qualifier: Diagnosis of  By: Joya Gaskins MD, Burnett Harry   . Hypertension     Past Surgical History  Procedure Date  . Tonsillectomy   . Wisdom tooth extraction     Family History  Problem Relation Age of Onset  . Lupus Father   . HIV Brother     History  Substance Use Topics  . Smoking status: Never Smoker   . Smokeless tobacco: Never Used  . Alcohol Use: Yes     Occasional wine      Review of Systems  Constitutional: Negative for fever.  Gastrointestinal: Negative for nausea and vomiting.  Skin: Positive for rash.    Allergies  Review of patient's allergies indicates no known allergies.  Home Medications   Current Outpatient Rx  Name Route Sig Dispense Refill  . AMLODIPINE BESYLATE 10 MG PO TABS Oral Take 10 mg by mouth daily.    . BUDESONIDE-FORMOTEROL FUMARATE 80-4.5 MCG/ACT IN AERO Inhalation Inhale 2 puffs into the lungs 2 (two) times daily.    Marland Kitchen CALCIUM 500 PO Oral Take 750 mg by mouth daily.    Marland Kitchen HYDROXYCHLOROQUINE SULFATE 200 MG PO TABS Oral Take 2 tablets (400 mg total) by mouth daily. 120 tablet 3  . PANTOPRAZOLE SODIUM 40 MG PO TBEC Oral Take 40 mg by mouth daily.    Marland Kitchen ZOLPIDEM TARTRATE 10 MG PO TABS Oral Take 10 mg by mouth at  bedtime as needed. For sleep      BP 121/77  Pulse 89  Temp 98.2 F (36.8 C) (Oral)  Resp 19  SpO2 96%  Physical Exam  Nursing note and vitals reviewed. Constitutional: He is oriented to person, place, and time. He appears well-developed and well-nourished. No distress.  HENT:  Head: Normocephalic.  Eyes: Conjunctivae and EOM are normal.  Cardiovascular: Normal rate.   Pulmonary/Chest: Effort normal.  Abdominal: Soft. Bowel sounds are normal.  Musculoskeletal: Normal range of motion.  Neurological: He is alert and oriented to person, place, and time.  Skin:       4x abscesses with cellulitis to bilateral lateral thighs and hip areas. The largest one is 3 cm in diameter with fluctuance in the center. No active drainage.  Psychiatric: He has a normal mood and affect.    ED Course  Procedures (including critical care time) INCISION AND DRAINAGE Performed by: Monico Blitz Consent: Verbal consent obtained. Risks and benefits: risks, benefits and alternatives were discussed Type: abscess  Body area: Left lateral thigh.  Anesthesia: local infiltration  Local anesthetic: lidocaine 2% without epinephrine  Anesthetic total: 5 ml  Complexity: complex Blunt dissection to break up loculations  Drainage: purulent  Drainage amount: 70mL  Packing material: 1/4  in iodoform gauze  Patient tolerance: Patient tolerated the procedure well with no immediate complications.  Labs Reviewed - No data to display No results found.   1. Abscess and cellulitis       MDM  31 year old man cooperative male complaining of recent onset of multiple abscesses to the bilateral thighs. One was fluctuant and appropriate for incision and drainage. The wound was drained and packed it and I will start him on Bactrim and Keflex for the other areas of induration that are not pointing. Pt verbalized understanding and agrees with care plan. Outpatient follow-up and return precautions given.            Monico Blitz, PA-C 05/20/12 [phone]

## 2012-05-20 NOTE — ED Notes (Signed)
Per EMS, the pt has boils on his bilateral lateral thighs.

## 2012-05-20 NOTE — ED Provider Notes (Signed)
Medical screening examination/treatment/procedure(s) were performed by non-physician practitioner and as supervising physician I was immediately available for consultation/collaboration.   Babette Relic, MD 05/20/12 1725

## 2012-05-22 ENCOUNTER — Encounter (HOSPITAL_COMMUNITY): Payer: Self-pay | Admitting: Emergency Medicine

## 2012-05-22 ENCOUNTER — Emergency Department (HOSPITAL_COMMUNITY)
Admission: EM | Admit: 2012-05-22 | Discharge: 2012-05-22 | Disposition: A | Discharge status: Home / Self Care | Payer: Medicare Other | Attending: Emergency Medicine | Admitting: Emergency Medicine

## 2012-05-22 DIAGNOSIS — L02419 Cutaneous abscess of limb, unspecified: Secondary | ICD-10-CM | POA: Diagnosis not present

## 2012-05-22 DIAGNOSIS — I10 Essential (primary) hypertension: Secondary | ICD-10-CM | POA: Diagnosis not present

## 2012-05-22 DIAGNOSIS — L0291 Cutaneous abscess, unspecified: Secondary | ICD-10-CM

## 2012-05-22 DIAGNOSIS — L0231 Cutaneous abscess of buttock: Secondary | ICD-10-CM | POA: Insufficient documentation

## 2012-05-22 DIAGNOSIS — M329 Systemic lupus erythematosus, unspecified: Secondary | ICD-10-CM | POA: Insufficient documentation

## 2012-05-22 DIAGNOSIS — J45909 Unspecified asthma, uncomplicated: Secondary | ICD-10-CM | POA: Insufficient documentation

## 2012-05-22 DIAGNOSIS — Z8489 Family history of other specified conditions: Secondary | ICD-10-CM | POA: Diagnosis not present

## 2012-05-22 DIAGNOSIS — R197 Diarrhea, unspecified: Secondary | ICD-10-CM | POA: Insufficient documentation

## 2012-05-22 DIAGNOSIS — R112 Nausea with vomiting, unspecified: Secondary | ICD-10-CM | POA: Insufficient documentation

## 2012-05-22 DIAGNOSIS — L03119 Cellulitis of unspecified part of limb: Secondary | ICD-10-CM | POA: Diagnosis not present

## 2012-05-22 MED ORDER — ONDANSETRON HCL 4 MG PO TABS: 4.0000 mg | ORAL_TABLET | Freq: Three times a day (TID) | ORAL | Status: DC | PRN

## 2012-05-22 MED ORDER — LORAZEPAM 1 MG PO TABS
1.0000 mg | ORAL_TABLET | Freq: Once | ORAL | Status: AC
  Administered 2012-05-22: 1 mg via ORAL
  Filled 2012-05-22: qty 1

## 2012-05-22 MED ORDER — HYDROCODONE-ACETAMINOPHEN 5-325 MG PO TABS: 1.0000 | ORAL_TABLET | ORAL | Status: DC | PRN

## 2012-05-22 MED ORDER — LIDOCAINE HCL 2 % IJ SOLN
20.0000 mL | Freq: Once | INTRAMUSCULAR | Status: AC
  Administered 2012-05-22: 400 mg via INTRADERMAL
  Filled 2012-05-22: qty 1

## 2012-05-22 NOTE — ED Notes (Signed)
Lidocaine and suture cart at bedside.  

## 2012-05-22 NOTE — ED Notes (Signed)
Pt presenting to ed with c/o follow up of abscess of buttocks.

## 2012-05-22 NOTE — ED Provider Notes (Signed)
History     CSN: [id number]  Arrival date & time 05/22/12  1236   First MD Initiated Contact with Patient 05/22/12 1356      Chief Complaint  Patient presents with  . Abscess    (Consider location/radiation/quality/duration/timing/severity/associated sxs/prior treatment) HPI Comments: Pt presents with multiple abscesses. 1 week ago, patient noticed an abscess on his upper right thigh. The next day, he noticed three more - 2 on his left buttock and one on his right buttock. Pt states they have enlarged and become more tender. 2 days ago he was seen in this ED and one of the abscesses on his left buttock was drained and packed. Pt was placed on bactrim and keflex and asked to follow-up today. Pt states he did not change the dressing from the abscess I&D. Pt reports the remaining abscesses feel as though they are getting worse, are very tender, and are draining blood and pus. Pt denies fever, chills.  Has been having N/V/D since starting the antibiotics.    Patient is a 31 y.o. male presenting with abscess. The history is provided by the patient.  Abscess  Associated symptoms include diarrhea and vomiting. Pertinent negatives include no fever.    Past Medical History  Diagnosis Date  . Lupus   . Asthma   . Bronchopulmonary dysplasia   . History of pericarditis   . PAROTITIS, RIGHT 09/30/2009    Qualifier: Diagnosis of  By: Joya Gaskins MD, Burnett Harry   . Hypertension     Past Surgical History  Procedure Date  . Tonsillectomy   . Wisdom tooth extraction     Family History  Problem Relation Age of Onset  . Lupus Father   . HIV Brother     History  Substance Use Topics  . Smoking status: Never Smoker   . Smokeless tobacco: Never Used  . Alcohol Use: Yes     Occasional wine      Review of Systems  Constitutional: Negative for fever and chills.  Gastrointestinal: Positive for nausea, vomiting and diarrhea. Negative for abdominal pain.  Genitourinary: Negative for dysuria,  urgency and frequency.    Allergies  Review of patient's allergies indicates no known allergies.  Home Medications   Current Outpatient Rx  Name Route Sig Dispense Refill  . AMLODIPINE BESYLATE 10 MG PO TABS Oral Take 10 mg by mouth daily.    . BUDESONIDE-FORMOTEROL FUMARATE 80-4.5 MCG/ACT IN AERO Inhalation Inhale 2 puffs into the lungs 2 (two) times daily.    Marland Kitchen CALCIUM 500 PO Oral Take 750 mg by mouth daily.    . CEPHALEXIN 500 MG PO CAPS Oral Take 500 mg by mouth 4 (four) times daily.    Marland Kitchen HYDROXYCHLOROQUINE SULFATE 200 MG PO TABS Oral Take 2 tablets (400 mg total) by mouth daily. 120 tablet 3  . PANTOPRAZOLE SODIUM 40 MG PO TBEC Oral Take 40 mg by mouth daily.    . SULFAMETHOXAZOLE-TRIMETHOPRIM 800-160 MG PO TABS Oral Take 1 tablet by mouth every 12 (twelve) hours.    . TRAMADOL HCL 50 MG PO TABS Oral Take 50 mg by mouth at bedtime.    Marland Kitchen ZOLPIDEM TARTRATE 10 MG PO TABS Oral Take 10 mg by mouth at bedtime as needed. For sleep      BP 136/89  Pulse 100  Temp 98.7 F (37.1 C) (Oral)  Resp 18  SpO2 100%  Physical Exam  Nursing note and vitals reviewed. Constitutional: He appears well-developed and well-nourished. No distress.  HENT:  Head: Normocephalic and atraumatic.  Neck: Neck supple.  Pulmonary/Chest: Effort normal.  Neurological: He is alert.  Skin: He is not diaphoretic.       ED Course  Procedures (including critical care time)  Labs Reviewed - No data to display No results found.  3:27 PM Patient also examined by Monico Blitz, PA-C, who saw patient two days ago.    INCISION AND DRAINAGE Performed by: Clayton Bibles B  Performed by Adolphus Birchwood, PA-S, under my direct supervision.  Consent: Verbal consent obtained. Risks and benefits: risks, benefits and alternatives were discussed Type: abscess x 2 , plus one abscess recheck.    Body area: right anterior thigh, left posterior thigh  Anesthesia: local infiltration  Local anesthetic: lidocaine 2% no  epinephrine, also lidocaine 1% without epi  Anesthetic total: 8 ml  Complexity: complex Blunt dissection to break up loculations  Drainage: purulent  Drainage amount: small-moderate  Packing material: 1/4 in iodoform gauze of left posterior thigh only.  Repacking of left posterior buttock that was previously drained.    Patient tolerance: Patient tolerated the procedure well with no immediate complications.     1. Abscess and cellulitis       MDM  Pt returns today for recheck of abscess.  Patient is afebrile, nontoxic.  Per Elmyra Ricks who saw patient two days ago, cellulitis is improved but areas are now more fluctuant and ready for I&D.  2 I&Ds done today, abscess with previous I&D repacked.  I think overall patient is improving but abscesses needed to be drained. Pt d/c home with zofran and norco, pt instructed to continue antibiotics until they are gone, discussed wound care.  Discussed diagnosis and care plan, follow up with patient.  Pt given return precautions.  Pt verbalizes understanding and agrees with plan.           [place], [place] 05/22/12 1623

## 2012-05-24 ENCOUNTER — Encounter (HOSPITAL_COMMUNITY): Payer: Self-pay | Admitting: *Deleted

## 2012-05-24 ENCOUNTER — Emergency Department (HOSPITAL_COMMUNITY)
Admission: EM | Admit: 2012-05-24 | Discharge: 2012-05-24 | Disposition: A | Discharge status: Home / Self Care | Payer: Medicare Other | Attending: Emergency Medicine | Admitting: Emergency Medicine

## 2012-05-24 DIAGNOSIS — L02429 Furuncle of limb, unspecified: Secondary | ICD-10-CM | POA: Diagnosis not present

## 2012-05-24 DIAGNOSIS — J45909 Unspecified asthma, uncomplicated: Secondary | ICD-10-CM | POA: Insufficient documentation

## 2012-05-24 DIAGNOSIS — I10 Essential (primary) hypertension: Secondary | ICD-10-CM | POA: Diagnosis not present

## 2012-05-24 DIAGNOSIS — L0291 Cutaneous abscess, unspecified: Secondary | ICD-10-CM

## 2012-05-24 DIAGNOSIS — M329 Systemic lupus erythematosus, unspecified: Secondary | ICD-10-CM | POA: Insufficient documentation

## 2012-05-24 DIAGNOSIS — L0231 Cutaneous abscess of buttock: Secondary | ICD-10-CM | POA: Insufficient documentation

## 2012-05-24 DIAGNOSIS — L02419 Cutaneous abscess of limb, unspecified: Secondary | ICD-10-CM | POA: Diagnosis not present

## 2012-05-24 DIAGNOSIS — Z4800 Encounter for change or removal of nonsurgical wound dressing: Secondary | ICD-10-CM | POA: Diagnosis not present

## 2012-05-24 DIAGNOSIS — L03119 Cellulitis of unspecified part of limb: Secondary | ICD-10-CM | POA: Insufficient documentation

## 2012-05-24 DIAGNOSIS — L0390 Cellulitis, unspecified: Secondary | ICD-10-CM | POA: Diagnosis not present

## 2012-05-24 NOTE — ED Notes (Signed)
Pt reports third time seen here. Pt here to have packing removed. Pt has two boils on bilateral thighs.

## 2012-05-24 NOTE — ED Provider Notes (Signed)
History     CSN: [id number]  Arrival date & time 05/24/12  1152   First MD Initiated Contact with Patient 05/24/12 1253      Chief Complaint  Patient presents with  . Follow-up    (Consider location/radiation/quality/duration/timing/severity/associated sxs/prior treatment) Patient is a 31 y.o. male presenting with abscess. The history is provided by the patient. No language interpreter was used.  Abscess  This is a recurrent problem. The current episode started less than one week ago. The problem has been gradually improving. The abscess is present on the right buttock and left buttock. The problem is moderate. The abscess is characterized by painfulness. Pertinent negatives include no fever and no vomiting. His past medical history does not include atopy in family or skin abscesses in family. Recently, medical care has been given at this facility.  31yo male here for abscess recheck for the 3rd time.  Patient has 1 abscesses to the R buttocks / 1 R anterior thigh with packing and 1 on the L buttocks/thigh with packing. One abscess on the L buttock /thigh without packing.    Patient was seen by Raquel Sarna PA 2 days ago with Repacking to R buttocks and I & D of R thigh and L buttocks.  The second L buttocks was not packed.  31 year old here for a recheck for abscess some stitches he left hip. Past Medical History  Diagnosis Date  . Lupus   . Asthma   . Bronchopulmonary dysplasia   . History of pericarditis   . PAROTITIS, RIGHT 09/30/2009    Qualifier: Diagnosis of  By: Joya Gaskins MD, Burnett Harry   . Hypertension     Past Surgical History  Procedure Date  . Tonsillectomy   . Wisdom tooth extraction     Family History  Problem Relation Age of Onset  . Lupus Father   . HIV Brother     History  Substance Use Topics  . Smoking status: Never Smoker   . Smokeless tobacco: Never Used  . Alcohol Use: Yes     Occasional wine      Review of Systems  Constitutional: Negative.  Negative  for fever and diaphoresis.  HENT: Negative.   Respiratory: Negative for shortness of breath.   Cardiovascular: Negative.   Gastrointestinal: Negative.  Negative for nausea and vomiting.  Genitourinary: Negative.   Musculoskeletal: Negative.   Skin:       Abcess x 4  Neurological: Negative.  Negative for dizziness, weakness and light-headedness.  Hematological: Negative for adenopathy.  Psychiatric/Behavioral: Negative.   All other systems reviewed and are negative.    Allergies  Review of patient's allergies indicates no known allergies.  Home Medications   Current Outpatient Rx  Name Route Sig Dispense Refill  . AMLODIPINE BESYLATE 10 MG PO TABS Oral Take 10 mg by mouth daily.    . BUDESONIDE-FORMOTEROL FUMARATE 80-4.5 MCG/ACT IN AERO Inhalation Inhale 2 puffs into the lungs 2 (two) times daily.    Marland Kitchen CALCIUM 500 PO Oral Take 750 mg by mouth daily.    . CEPHALEXIN 500 MG PO CAPS Oral Take 500 mg by mouth 4 (four) times daily.    Marland Kitchen HYDROXYCHLOROQUINE SULFATE 200 MG PO TABS Oral Take 2 tablets (400 mg total) by mouth daily. 120 tablet 3  . ONDANSETRON HCL 4 MG PO TABS Oral Take 4 mg by mouth every 8 (eight) hours as needed.    Marland Kitchen PANTOPRAZOLE SODIUM 40 MG PO TBEC Oral Take 40 mg by mouth daily.    Marland Kitchen  SULFAMETHOXAZOLE-TRIMETHOPRIM 800-160 MG PO TABS Oral Take 1 tablet by mouth every 12 (twelve) hours.    . TRAMADOL HCL 50 MG PO TABS Oral Take 50 mg by mouth at bedtime.    Marland Kitchen ZOLPIDEM TARTRATE 10 MG PO TABS Oral Take 10 mg by mouth at bedtime as needed. For sleep      BP 122/68  Pulse 95  Temp 98.3 F (36.8 C) (Oral)  Resp 17  SpO2 100%  Physical Exam  Vitals reviewed. Constitutional: He is oriented to person, place, and time. He appears well-developed and well-nourished. No distress.  HENT:  Head: Normocephalic.  Eyes: Pupils are equal, round, and reactive to light.  Neck: Normal range of motion.  Cardiovascular: Normal rate.   Pulmonary/Chest: Effort normal.    Neurological: He is alert and oriented to person, place, and time.  Skin: Skin is warm and dry.          Packing removed from abscess x 3.  Cellulitis nondetectable.  Serosang drainage.  Flushed abscesses with ns and redressed without packing    ED Course  Irrigation Date/Time: 05/24/2012 1:40 PM Performed by: Julieta Bellini Authorized by: Julieta Bellini Consent: Verbal consent obtained. Written consent not obtained. Risks and benefits: risks, benefits and alternatives were discussed Consent given by: patient Patient understanding: patient states understanding of the procedure being performed Patient identity confirmed: verbally with patient, arm band, provided demographic data and hospital-assigned identification number Time out: Immediately prior to procedure a "time out" was called to verify the correct patient, procedure, equipment, support staff and site/side marked as required. Preparation: Patient was prepped and draped in the usual sterile fashion. Local anesthesia used: no Patient sedated: no Patient tolerance: Patient tolerated the procedure well with no immediate complications. Comments: Abscess x 4 flushed with ns   (including critical care time)  Labs Reviewed - No data to display No results found.   No diagnosis found.    MDM  Abscess recheck x 4  Packing removed x 3.  Flushed with ns.  Cellulitis undetectable.  Patient agrees much better.  Continue antibiotics x2.   Follow up with pcp of choice .  Return if worse.         Julieta Bellini, NP 05/25/12 1146

## 2012-05-24 NOTE — ED Provider Notes (Signed)
Medical screening examination/treatment/procedure(s) were performed by non-physician practitioner and as supervising physician I was immediately available for consultation/collaboration.  Virgel Manifold, MD 05/24/12 [phone]

## 2012-05-24 NOTE — ED Notes (Signed)
Pt had four abcesses lanced two days ago and was told to come back for a recheck, he states that its still sore but better, he's still taking his medications.

## 2012-05-25 NOTE — ED Provider Notes (Signed)
Medical screening examination/treatment/procedure(s) were performed by non-physician practitioner and as supervising physician I was immediately available for consultation/collaboration.  Chauncy Passy, MD 05/25/12 [phone]

## 2012-07-19 ENCOUNTER — Ambulatory Visit: Payer: Medicare Other | Admitting: Critical Care Medicine

## 2012-07-24 ENCOUNTER — Other Ambulatory Visit (INDEPENDENT_AMBULATORY_CARE_PROVIDER_SITE_OTHER): Payer: Medicare Other

## 2012-07-24 ENCOUNTER — Encounter: Payer: Self-pay | Admitting: Gastroenterology

## 2012-07-24 ENCOUNTER — Encounter: Payer: Self-pay | Admitting: Critical Care Medicine

## 2012-07-24 ENCOUNTER — Ambulatory Visit (INDEPENDENT_AMBULATORY_CARE_PROVIDER_SITE_OTHER): Payer: Medicare Other | Admitting: Critical Care Medicine

## 2012-07-24 VITALS — BP 120/82 | HR 108 | Temp 98.8°F | Ht 68.0 in | Wt 155.6 lb

## 2012-07-24 DIAGNOSIS — R911 Solitary pulmonary nodule: Secondary | ICD-10-CM

## 2012-07-24 DIAGNOSIS — L943 Sclerodactyly: Secondary | ICD-10-CM

## 2012-07-24 DIAGNOSIS — J479 Bronchiectasis, uncomplicated: Secondary | ICD-10-CM | POA: Diagnosis not present

## 2012-07-24 DIAGNOSIS — J45909 Unspecified asthma, uncomplicated: Secondary | ICD-10-CM

## 2012-07-24 DIAGNOSIS — K219 Gastro-esophageal reflux disease without esophagitis: Secondary | ICD-10-CM | POA: Diagnosis not present

## 2012-07-24 DIAGNOSIS — L940 Localized scleroderma [morphea]: Secondary | ICD-10-CM

## 2012-07-24 MED ORDER — FAMOTIDINE 40 MG PO TABS: 40.0000 mg | ORAL_TABLET | Freq: Every day | ORAL | Status: DC

## 2012-07-24 MED ORDER — DEXLANSOPRAZOLE 60 MG PO CPDR: 60.0000 mg | DELAYED_RELEASE_CAPSULE | Freq: Every day | ORAL | Status: DC

## 2012-07-24 MED ORDER — METOCLOPRAMIDE HCL 10 MG PO TABS: 10.0000 mg | ORAL_TABLET | Freq: Three times a day (TID) | ORAL | Status: DC

## 2012-07-24 NOTE — Patient Instructions (Addendum)
Labs today Start Dexilant daily Start Pepcid 40mg  nightly Start reglan 10mg  three times daily at meals and bedtime A referral to Rheumatology and Gastroenterology will be made Return 2 months

## 2012-07-24 NOTE — Progress Notes (Signed)
Subjective:    Patient ID: Jorge [name], male    DOB: 03-Sep-1981, 31 y.o.   MRN: [id number]  HPI  31 y.o.  male with known hx of  bronchopulmonary dysplasia and AB , Lupus   4/23 [facility] follow up  Patient presents for a post hospital followup. Patient was admitted April 13 of 01/22/2012 for her community-acquired pneumonia. Patient has a known history of bronchopulmonary dysplasia and steroid dependent lupus. He has not been seen in our office in greater than 2.5 yrs. . Patient was admitted with a progressively worsening shortness of breath and pleuritic chest pain. He underwent a CT of the chest. That was negative for pulmonary emboli. CT showed airspace disease in LLL> a stable 6 mm right middle lobe pulmonary nodule. Patient was treated with aggressive IV antibiotics, steroids, and nebulized bronchodilators. He was discharged on Ceftin azithromycin and Levaquin for an additional 5 days. He was also discharged on prednisone taper. He was given anti-inflammatories and pain meds for pleuritic chest pain.  Since discharge. Patient is feeling improved with decreased cough and shortness of breath. Has finished all the abx. Currently on prednisone 10mg  daily. -on chronic steroids .   Of note pt was started on HTN meds during hospital stay. He was started on an ACE - Lisinopril and Norvasc.   03/18/2012 Dyspnea is better. Occ cough but not as congested.  No real wheeze.  No chest pain.   Pt denies any significant sore throat, nasal congestion or excess secretions, fever, chills, sweats, unintended weight loss, pleurtic or exertional chest pain, orthopnea PND, or leg swelling Pt denies any increase in rescue therapy over baseline, denies waking up needing it or having any early am or nocturnal exacerbations of coughing/wheezing/or dyspnea. Pt also denies any obvious fluctuation in symptoms with  weather or environmental change or other alleviating or aggravating factors   07/24/2012 Now  not as well, not able to sleep well. Gets up 730AM, once up, not able to sleep until bedtime.  Usually in bed by 930pm.  Occ awaken at 430AM as well.  Notes some pain on L side of chest, sharp stabbing.  Notes some wheezing, notes no mucus.  Notes more dyspnea with exertion. Pt with emesis after eating.  Notes more heartburn and is to the excess. Pt notes chest pain at 430AM and this creates dyspnea despite using zantac prn QHS . Pt also on protonix daily.         Review of Systems  Constitutional:   No  weight loss, night sweats,  Fevers, chills,  +fatigue, or  lassitude.  HEENT:   No headaches,  Difficulty swallowing,  Tooth/dental problems, or  Sore throat,                No sneezing, itching, ear ache, nasal congestion, post nasal drip,   CV:  No chest pain,  Orthopnea, PND, swelling in lower extremities, anasarca, dizziness, palpitations, syncope.   GI  Notes  Heartburn,+++ indigestion, abdominal pain, nausea, vomiting, diarrhea, change in bowel habits, loss of appetite, bloody stools.   +++dysphagia  Resp: Notes  shortness of breath with exertion or at rest.  No excess mucus, no productive cough,  No non-productive cough,  No coughing up of blood.  No change in color of mucus.  No wheezing.  No chest wall deformity  Skin: no rash or lesions.  GU: no dysuria, change in color of urine, no urgency or frequency.  No flank pain, no hematuria   MS:  Notes  joint pain and swelling of both hands/fingers.  Notes  decreased range of motion of fingers both hands.  No back pain.  Psych:  No change in mood or affect. No depression or anxiety.  No memory loss.        Objective:   Physical Exam BP 120/82  Pulse 108  Temp 98.8 F (37.1 C) (Oral)  Ht 5\' 8"  (1.727 m)  Wt 155 lb 9.6 oz (70.58 kg)  BMI 23.66 kg/m2  SpO2 98%  GEN: A/Ox3; pleasant , NAD, well nourished   HEENT:  [place]/AT,  EACs-clear, TMs-wnl, NOSE-clear, THROAT-clear, no lesions, no postnasal drip or exudate noted.    NECK:  Supple w/ fair ROM; no JVD; normal carotid impulses w/o bruits; no thyromegaly or nodules palpated; no lymphadenopathy.  RESP  Clear  P & A; w/o, wheezes/ rales/ or rhonchi.no accessory muscle use, no dullness to percussion  CARD:  RRR, no m/r/g  , no peripheral edema, pulses intact, no cyanosis or clubbing.  GI:   Soft & nt; nml bowel sounds; no organomegaly or masses detected.  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    Skin: Warm, no lesions or rashes         Assessment & Plan:   Extrinsic asthma, unspecified Severe persistent asthma but now symptoms consistent with Severe GERD and esophageal dysfunction. 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  ?scleroderma. Prior hx of lupus. Also with emesis and reflux this is flaring pts asthma. 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 amLODipine (NORVASC) 10 MG tablet Take 10 mg by mouth daily.      . budesonide-formoterol (SYMBICORT) 80-4.5 MCG/ACT inhaler Inhale 2 puffs into the lungs 2 (two) times daily.      . Calcium Carbonate (CALCIUM 500 PO) Take 750 mg by mouth daily.      . hydroxychloroquine (PLAQUENIL) 200 MG tablet Take 2 tablets (400 mg total) by mouth daily.  120 tablet  3  . zolpidem (AMBIEN) 10 MG tablet Take 10 mg by mouth at bedtime as needed. For sleep      . DISCONTD: pantoprazole (PROTONIX) 40 MG tablet Take 40 mg  by mouth daily.      Marland Kitchen DISCONTD: ranitidine (ZANTAC) 150 MG capsule Take 150 mg by mouth daily as needed.      Marland Kitchen dexlansoprazole (DEXILANT) 60 MG capsule Take 1 capsule (60 mg total) by mouth daily.  30 capsule  4  . famotidine (PEPCID) 40 MG tablet Take 1 tablet (40 mg total) by mouth daily.  30 tablet  6  . metoCLOPramide (REGLAN) 10 MG tablet Take 1 tablet (10 mg total) by mouth 4 (four) times daily -  before meals and at bedtime.  60 tablet  6

## 2012-07-25 ENCOUNTER — Telehealth: Payer: Self-pay | Admitting: Critical Care Medicine

## 2012-07-25 DIAGNOSIS — M329 Systemic lupus erythematosus, unspecified: Secondary | ICD-10-CM

## 2012-07-25 LAB — ANTI-NUCLEAR AB-TITER (ANA TITER): ANA Titer 1: 1:2560 {titer} — ABNORMAL HIGH

## 2012-07-25 MED ORDER — PREDNISONE 10 MG PO TABS: ORAL_TABLET | ORAL | Status: DC

## 2012-07-25 NOTE — Assessment & Plan Note (Signed)
Stable pulmonary nodule R No further evaluation needed

## 2012-07-25 NOTE — Assessment & Plan Note (Addendum)
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

## 2012-07-25 NOTE — Assessment & Plan Note (Addendum)
Severe persistent asthma but now symptoms consistent with Severe GERD and esophageal dysfunction. 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  ?scleroderma. Prior hx of lupus. Also with emesis and reflux this is flaring pts asthma. Plan Autoimmune panel today Start Dexilant daily Start Pepcid 40mg  nightly Start reglan 10mg  three times daily at meals and bedtime Continue symbicort A referral to [facility]ology ([facility]) and Gastroenterology ([facility]) will be made Return 2 months

## 2012-07-25 NOTE — Telephone Encounter (Signed)
I called the pt and told him all labs positive for lupus flare No scleroderma  I sent prednisone pulse into the pharmacy Pt to keep Rheumatology Appt.  Asencion Noble MD

## 2012-08-09 ENCOUNTER — Encounter: Payer: Self-pay | Admitting: Gastroenterology

## 2012-08-09 ENCOUNTER — Ambulatory Visit (INDEPENDENT_AMBULATORY_CARE_PROVIDER_SITE_OTHER): Payer: Medicare Other | Admitting: Gastroenterology

## 2012-08-09 VITALS — BP 124/74 | HR 100 | Ht 66.75 in | Wt 172.2 lb

## 2012-08-09 DIAGNOSIS — J479 Bronchiectasis, uncomplicated: Secondary | ICD-10-CM

## 2012-08-09 DIAGNOSIS — K219 Gastro-esophageal reflux disease without esophagitis: Secondary | ICD-10-CM

## 2012-08-09 DIAGNOSIS — R1310 Dysphagia, unspecified: Secondary | ICD-10-CM

## 2012-08-09 DIAGNOSIS — M329 Systemic lupus erythematosus, unspecified: Secondary | ICD-10-CM | POA: Diagnosis not present

## 2012-08-09 DIAGNOSIS — I7300 Raynaud's syndrome without gangrene: Secondary | ICD-10-CM

## 2012-08-09 MED ORDER — MAGIC MOUTHWASH W/LIDOCAINE: 10.0000 mL | Freq: Four times a day (QID) | ORAL | Status: DC

## 2012-08-09 MED ORDER — FAMOTIDINE 10 MG PO TABS
10.0000 mg | ORAL_TABLET | Freq: Every evening | ORAL | Status: DC | PRN
Start: 2012-08-09 — End: 2013-09-28

## 2012-08-09 NOTE — Patient Instructions (Addendum)
You have been scheduled for an endoscopy with propofol. Please follow written instructions given to you at your visit today. If you use inhalers (even only as needed) or a CPAP machine, please bring them with you on the day of your procedure.  We have sent the following medications to your pharmacy for you to pick up at your convenience: Pepcid AC.  You have been scheduled for an esophageal manometry at [facility] on 09/02/12 at 11:30am. Please arrive 30 minutes prior to your procedure for registration. You will need to go to outpatient registration (1st floor of the hospital) first. Make certain to bring your insurance cards as well as a complete list of medications.  Please remember the following:  1) Nothing to eat or drink after 12:00 midnight on the night before your test.  2) Hold all diabetic medications/insulin the morning of the test. You may eat and take your medications after the test.  3) For 3 days prior to your test do not take: Dexilant, Prevacid, Nexium, Protonix, Aciphex, Zegerid, Pantoprazole, Prilosec or omeprazole.  4) For 2 days prior to your test, do not take: Reglan, Tagamet, Zantac, Axid or Pepcid.  5) You MAY use an antacid such as Rolaids or Tums up to 12 hours prior to your test.  It will take at least 2 weeks to receive the results of this test from your physician. ------------------------------------------ ABOUT ESOPHAGEAL MANOMETRY Esophageal manometry (muh-NOM-uh-tree) is a test that gauges how well your esophagus works. Your esophagus is the long, muscular tube that connects your throat to your stomach. Esophageal manometry measures the rhythmic muscle contractions (peristalsis) that occur in your esophagus when you swallow. Esophageal manometry also measures the coordination and force exerted by the muscles of your esophagus.  During esophageal manometry, a thin, flexible tube (catheter) that contains sensors is passed through your nose, down your  esophagus and into your stomach. Esophageal manometry can be helpful in diagnosing some mostly uncommon disorders that affect your esophagus.  Why it's done Esophageal manometry is used to evaluate the movement (motility) of food through the esophagus and into the stomach. The test measures how well the circular bands of muscle (sphincters) at the top and bottom of your esophagus open and close, as well as the pressure, strength and pattern of the wave of esophageal muscle contractions that moves food along.  What you can expect Esophageal manometry is an outpatient procedure done without sedation. Most people tolerate it well. You may be asked to change into a hospital gown before the test starts.  During esophageal manometry  While you are sitting up, a member of your health care team sprays your throat with a numbing medication or puts numbing gel in your nose or both.  A catheter is guided through your nose into your esophagus. The catheter may be sheathed in a water-filled sleeve. It doesn't interfere with your breathing. However, your eyes may water, and you may gag. You may have a slight nosebleed from irritation.  After the catheter is in place, you may be asked to lie on your back on an exam table, or you may be asked to remain seated.  You then swallow small sips of water. As you do, a computer connected to the catheter records the pressure, strength and pattern of your esophageal muscle contractions.  During the test, you'll be asked to breathe slowly and smoothly, remain as still as possible, and swallow only when you're asked to do so.  A member of your  health care team may move the catheter down into your stomach while the catheter continues its measurements.  The catheter then is slowly withdrawn. The test usually lasts 20 to 30 minutes.  After esophageal manometry  When your esophageal manometry is complete, you may return to your normal activities  This test typically takes 30-45  minutes to complete. ________________________________________________________________________________   Diet for Gastroesophageal Reflux Disease, Adult Reflux (acid reflux) is when acid from your stomach flows up into the esophagus. When acid comes in contact with the esophagus, the acid causes irritation and soreness (inflammation) in the esophagus. When reflux happens often or so severely that it causes damage to the esophagus, it is called gastroesophageal reflux disease (GERD). Nutrition therapy can help ease the discomfort of GERD. FOODS OR DRINKS TO AVOID OR LIMIT  Smoking or chewing tobacco. Nicotine is one of the most potent stimulants to acid production in the gastrointestinal tract.  Caffeinated and decaffeinated coffee and black tea.  Regular or low-calorie carbonated beverages or energy drinks (caffeine-free carbonated beverages are allowed).   Strong spices, such as black pepper, white pepper, red pepper, cayenne, curry powder, and chili powder.  Peppermint or spearmint.  Chocolate.  High-fat foods, including meats and fried foods. Extra added fats including oils, butter, salad dressings, and nuts. Limit these to less than 8 tsp per day.  Fruits and vegetables if they are not tolerated, such as citrus fruits or tomatoes.  Alcohol.  Any food that seems to aggravate your condition. If you have questions regarding your diet, call your caregiver or a registered dietitian. OTHER THINGS THAT MAY HELP GERD INCLUDE:   Eating your meals slowly, in a relaxed setting.  Eating 5 to 6 small meals per day instead of 3 large meals.  Eliminating food for a period of time if it causes distress.  Not lying down until 3 hours after eating a meal.  Keeping the head of your bed raised 6 to 9 inches (15 to 23 cm) by using a foam wedge or blocks under the legs of the bed. Lying flat may make symptoms worse.  Being physically active. Weight loss may be helpful in reducing reflux in  overweight or obese adults.  Wear loose fitting clothing EXAMPLE MEAL PLAN This meal plan is approximately 2,000 calories based on [email or web address] meal planning guidelines. Breakfast   cup cooked oatmeal.  1 cup strawberries.  1 cup low-fat milk.  1 oz almonds. Snack  1 cup cucumber slices.  6 oz yogurt (made from low-fat or fat-free milk). Lunch  2 slice whole-wheat bread.  2 oz sliced [place].  2 tsp mayonnaise.  1 cup blueberries.  1 cup snap peas. Snack  6 whole-wheat crackers.  1 oz string cheese. Dinner   cup brown rice.  1 cup mixed veggies.  1 tsp olive oil.  3 oz grilled fish. Document Released: 09/25/2005 Document Revised: 12/18/2011 Document Reviewed: 08/11/2011 [facility] Patient Information 2013 [place].

## 2012-08-09 NOTE — Progress Notes (Signed)
History of Present Illness:  This is a complicated 31 year old [place] American male who initially presented with pericarditis and was found to have autoimmune disease with a combination of Raynaud's phenomenon and SLE in 2009. He also has had asthmatic bronchitis and bronchiectasis managed by Dr. Asencion Noble. He has had previous bronchoscopy, and was recently hospitalized and treated for community-acquired pneumonia with antibiotics. He is on and off of corticosteroids for his him autoimmune disease, currently on.is on prednisone 10 mg a day along with Plaquinel 40 mg a day. He is referred to GI because of over a year of acid reflux symptoms with intermittent solid food dysphagia, regurgitation, and burning substernal chest pain partially relieved by Dexilant 60 mg a day and Reglan 10 mg at bedtime. Patient does have a history of Raynaud's phenomenon. He does not have painful swallowing, but review of his chart, he has had esophageal dysfunction for many years, and CT scan of the chest in 2008 showed a dilated esophagus with an air-fluid level. Barium swallow was considered but apparently was not done. The patient denies bowel or regularity, melena or hematochezia, any history of hepatobiliary disease, pancreatitis or hepatitis. He follows a fairly regular diet is maintaining a normal weight. He denies abuse of alcohol, cigarettes, or NSAIDs. He is totally disabled because of his condition. Cardiologist is Dr. Chana Bode. Apparently he does have rheumatology referral. I cannot see where he has a primary care physician.  I have reviewed this patient's present history, medical and surgical past history, allergies and medications.     ROS: The remainder of the 10 point ROS is negative.. he complains of severe low back pain which is positional in nature. He also complains of a dry cough, chronic fatigue, diffuse myalgias, recurrent skin rashes on his face and chronic insomnia. He is shortness of breath  with exertion, and his had previous cardiac evaluation which has not shown any evidence of significant CHF.     Physical Exam: Blood pressure 124/74, pulse 100 and regular, weight 172 pounds General well developed well nourished patient in no acute distress, appearing his stated age, I cannot appreciate any oral pharyngeal mucosa lesions. Eyes PERRLA, no icterus, fundoscopic exam per opthamologist Skin no lesions noted, mild facial telangiectasias noted., Some early sclerodactyly noted in his hands are not cold or cyanotic. Neck supple, no adenopathy, no thyroid enlargement, no tenderness Chest diminished breath sounds in both lung fields with scattered rhonchi. Heart no significant murmurs, gallops or rubs noted Abdomen no hepatosplenomegaly masses or tenderness, BS normal. . Extremities no acute joint lesions, edema, phlebitis or evidence of cellulitis. Back exam shows no masses or tenderness or CVA tenderness. Neurologic patient oriented x 3, cranial nerves intact, no focal neurologic deficits noted. Psychological mental status normal and normal affect.  Assessment and plan: Complicated patient with autoimmune disease and Raynaud's phenomenon. He has recurrent serositis, arthritis, and may have spinal compression fractures related to his steroid use. As mentioned above rheumatology consult is pending. He is currently recovering from community-acquired pneumonia. 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 These patients require strict long-term acid suppression, and usually medication such as metoclopramide do not work. I've asked him to continue Dexilant 60 mg a morning with Pepcid AC before bed. I've scheduled esophageal manometry to exclude achalasia, and we will do endoscopy once his pulmonary symptoms  have  improved. He also needs a primary care physician or rheumatologist, and probably needs further evaluation of his low back pain as mentioned above.   Cc" Dr Asencion Noble and Dr. Hurley Cisco in rheumatology

## 2012-08-12 ENCOUNTER — Other Ambulatory Visit: Payer: Self-pay | Admitting: Gastroenterology

## 2012-08-12 NOTE — Telephone Encounter (Signed)
lmom for pt to call back

## 2012-08-16 NOTE — Telephone Encounter (Signed)
Per [place] @ [facility], patient did pick up his Pepcid prescription.

## 2012-08-19 ENCOUNTER — Ambulatory Visit (AMBULATORY_SURGERY_CENTER): Payer: Medicare Other | Admitting: Gastroenterology

## 2012-08-19 ENCOUNTER — Encounter: Payer: Self-pay | Admitting: Gastroenterology

## 2012-08-19 VITALS — BP 155/107 | HR 83 | Temp 96.6°F | Resp 20 | Ht 67.0 in | Wt 172.0 lb

## 2012-08-19 DIAGNOSIS — R1310 Dysphagia, unspecified: Secondary | ICD-10-CM

## 2012-08-19 DIAGNOSIS — K222 Esophageal obstruction: Secondary | ICD-10-CM | POA: Diagnosis not present

## 2012-08-19 DIAGNOSIS — R911 Solitary pulmonary nodule: Secondary | ICD-10-CM | POA: Diagnosis not present

## 2012-08-19 DIAGNOSIS — M329 Systemic lupus erythematosus, unspecified: Secondary | ICD-10-CM

## 2012-08-19 DIAGNOSIS — K219 Gastro-esophageal reflux disease without esophagitis: Secondary | ICD-10-CM | POA: Diagnosis not present

## 2012-08-19 DIAGNOSIS — J45909 Unspecified asthma, uncomplicated: Secondary | ICD-10-CM | POA: Diagnosis not present

## 2012-08-19 DIAGNOSIS — J479 Bronchiectasis, uncomplicated: Secondary | ICD-10-CM | POA: Diagnosis not present

## 2012-08-19 HISTORY — PX: ESOPHAGOGASTRODUODENOSCOPY (EGD) WITH ESOPHAGEAL DILATION: SHX5812

## 2012-08-19 MED ORDER — SODIUM CHLORIDE 0.9 % IV SOLN: 500.0000 mL | INTRAVENOUS | Status: DC

## 2012-08-19 MED ORDER — MAGIC MOUTHWASH W/LIDOCAINE: 10.0000 mL | Freq: Four times a day (QID) | ORAL | Status: DC

## 2012-08-19 NOTE — Patient Instructions (Addendum)
YOU HAD AN ENDOSCOPIC PROCEDURE TODAY AT THE [facility]: Refer to the procedure report that was given to you for any specific questions about what was found during the examination.  If the procedure report does not answer your questions, please call your gastroenterologist to clarify.  If you requested that your care partner not be given the details of your procedure findings, then the procedure report has been included in a sealed envelope for you to review at your convenience later.  YOU SHOULD EXPECT: Some feelings of bloating in the abdomen. Passage of more gas than usual.  Walking can help get rid of the air that was put into your GI tract during the procedure and reduce the bloating. If you had a lower endoscopy (such as a colonoscopy or flexible sigmoidoscopy) you may notice spotting of blood in your stool or on the toilet paper. If you underwent a bowel prep for your procedure, then you may not have a normal bowel movement for a few days.  DIET: Your first meal following the procedure should be a light meal and then it is ok to progress to your normal diet.  A half-sandwich or bowl of soup is an example of a good first meal.  Heavy or fried foods are harder to digest and may make you feel nauseous or bloated.  Likewise meals heavy in dairy and vegetables can cause extra gas to form and this can also increase the bloating.  Drink plenty of fluids but you should avoid alcoholic beverages for 24 hours.  ACTIVITY: Your care partner should take you home directly after the procedure.  You should plan to take it easy, moving slowly for the rest of the day.  You can resume normal activity the day after the procedure however you should NOT DRIVE or use heavy machinery for 24 hours (because of the sedation medicines used during the test).    SYMPTOMS TO REPORT IMMEDIATELY: A gastroenterologist can be reached at any hour.  During normal business hours, 8:30 AM to 5:00 PM Monday through Friday,  call [phone].  After hours and on weekends, please call the GI answering service at [phone] who will take a message and have the physician on call contact you.    Following upper endoscopy (EGD)  Vomiting of blood or coffee ground material  New chest pain or pain under the shoulder blades  Painful or persistently difficult swallowing  New shortness of breath  Fever of 100F or higher  Black, tarry-looking stools  FOLLOW UP: If any biopsies were taken you will be contacted by phone or by letter within the next 1-3 weeks.  Call your gastroenterologist if you have not heard about the biopsies in 3 weeks.  Our staff will call the home number listed on your records the next business day following your procedure to check on you and address any questions or concerns that you may have at that time regarding the information given to you following your procedure. This is a courtesy call and so if there is no answer at the home number and we have not heard from you through the emergency physician on call, we will assume that you have returned to your regular daily activities without incident.  SIGNATURES/CONFIDENTIALITY: You and/or your care partner have signed paperwork which will be entered into your electronic medical record.  These signatures attest to the fact that that the information above on your After Visit Summary has been reviewed and is understood.  Full   responsibility of the confidentiality of this discharge information lies with you and/or your care-partner.    Stricture, esophagitis,post dilation diet given. Clear liquids only until after midnight.  Call for office visit in one month with Dr. Sharlett Iles.  Resume regular diet tomorrow.

## 2012-08-19 NOTE — Op Note (Addendum)
[facility]  [address] [place], [address]   ENDOSCOPY PROCEDURE REPORT  PATIENT: [name], [name]  MR#: [id number] BIRTHDATE: [birth year] , 31  yrs. old GENDER: Male ENDOSCOPIST:David Consuello Masse, MD, [facility] REFERRED BY: PROCEDURE DATE:  08/19/2012 PROCEDURE:   Venia Minks dilation of esophagus and EGD, diagnostic ASA CLASS:    Class II INDICATIONS: SLE WITH CHRONIC GERD AND WORSENING DYSPHAGIA. MEDICATION: Propofol (Diprivan) 600 mg IV TOPICAL ANESTHETIC:   Cetacaine Spray  DESCRIPTION OF PROCEDURE:   After the risks and benefits of the procedure were explained, informed consent was obtained.  The LB-GIF Q180 [id number]  endoscope was introduced through the mouth and advanced to the second portion of the duodenum .  The instrument was slowly withdrawn as the mucosa was fully examined.    STRICTURE AT GE JUNCTION ANS ASSOCIATED ESOPHAGITIS,3 CM.HH NOTED.ENDOSCOPY OTHERWISE NORMAL.DILATED #34F MALONEY DILATOR...SOME RESISTANCE,REPEAT EXAM WITH HEME AND LATERAL TEAR. The scope was then withdrawn from the patient and the procedure completed.  COMPLICATIONS: There were no complications.   ENDOSCOPIC IMPRESSION: STRICTURE AT GE JUNCTION ANS ASSOCIATED ESOPHAGITIS,3 CM.HH NOTED.ENDOSCOPY OTHERWISE NORMAL.DILATED #34F MALONEY DILATOR...SOME RESISTANCE,REPEAT EXAM WITH HEME AND LATERAL TEAR. SEVERE GERD AND CHRONIC STRICTURE DILATED.  RECOMMENDATIONS: 1.  continue PPI 2.  Clear liquids until MBN  , then soft foods rest iof day.  Resume prior diet tomorrow. 3. OV 1 MONTH   _______________________________ eSignedSable Feil, MD, [facility] 08/19/2012 4:46 PM   standard discharge   PATIENT NAME:  Jorge [name], Jorge [name] MR#: [id number]

## 2012-08-19 NOTE — Progress Notes (Signed)
Patient did not experience any of the following events: a burn prior to discharge; a fall within the facility; wrong site/side/patient/procedure/implant event; or a hospital transfer or hospital admission upon discharge from the facility. (G8907) Patient did not have preoperative order for IV antibiotic SSI prophylaxis. (G8918)  

## 2012-08-20 ENCOUNTER — Telehealth: Payer: Self-pay | Admitting: *Deleted

## 2012-08-20 ENCOUNTER — Other Ambulatory Visit: Payer: Self-pay | Admitting: *Deleted

## 2012-08-20 DIAGNOSIS — R1310 Dysphagia, unspecified: Secondary | ICD-10-CM

## 2012-08-20 MED ORDER — MAGIC MOUTHWASH: 10.0000 mL | Freq: Four times a day (QID) | ORAL | Status: DC

## 2012-08-20 NOTE — Telephone Encounter (Signed)
Pharmacy called wanting to know how much lidocaine to put in magic mouthwash.  Resent rx for magic mouthwash without lidocaine.

## 2012-08-20 NOTE — Telephone Encounter (Signed)
  Follow up Call-  Call back number 08/19/2012  Post procedure Call Back phone  # [phone]  Permission to leave phone message Yes     Patient questions:  Do you have a fever, pain , or abdominal swelling? no Pain Score  0 *  Have you tolerated food without any problems? yes  Have you been able to return to your normal activities? yes  Do you have any questions about your discharge instructions: Diet   no Medications  no Follow up visit  no  Do you have questions or concerns about your Care? no  Actions: * If pain score is 4 or above: No action needed, pain <4.

## 2012-08-21 ENCOUNTER — Telehealth: Payer: Self-pay | Admitting: *Deleted

## 2012-08-21 DIAGNOSIS — M329 Systemic lupus erythematosus, unspecified: Secondary | ICD-10-CM

## 2012-08-21 DIAGNOSIS — R1310 Dysphagia, unspecified: Secondary | ICD-10-CM

## 2012-08-21 NOTE — Telephone Encounter (Signed)
Called to give pt his f/u appt and schedule his EM and one was already scheduled for 09/02/12. Pt was aware of his EM and the prep; he will f/u with Dr Sharlett Iles on 09/17/12. Pt will call for questions.

## 2012-09-02 ENCOUNTER — Ambulatory Visit (HOSPITAL_COMMUNITY): Admission: RE | Admit: 2012-09-02 | Payer: Medicare Other | Source: Ambulatory Visit | Admitting: Gastroenterology

## 2012-09-02 ENCOUNTER — Encounter (HOSPITAL_COMMUNITY): Admission: RE | Payer: Self-pay | Source: Ambulatory Visit

## 2012-09-02 SURGERY — MANOMETRY, ESOPHAGUS

## 2012-09-02 NOTE — Progress Notes (Signed)
Called and informed Caren Griffins, RN with Dr. Sharlett Iles, that pt did not show for his manometry appt this a.m.  She informed that she would make a note.

## 2012-09-03 ENCOUNTER — Telehealth: Payer: Self-pay | Admitting: *Deleted

## 2012-09-03 NOTE — Telephone Encounter (Signed)
Stephanie from [facility] called yesterday to report pt was a NS for his EM. Called pt this am and states he overslept for his EM and please r/s. R/S for 09/20/2013 at 11:30am. Informed pt he needs to arrive at 11am and NPO after midnight; pt stated understanding.

## 2012-09-11 ENCOUNTER — Encounter: Payer: Self-pay | Admitting: *Deleted

## 2012-09-16 ENCOUNTER — Encounter (HOSPITAL_COMMUNITY): Payer: Self-pay

## 2012-09-16 ENCOUNTER — Ambulatory Visit (HOSPITAL_COMMUNITY): Admit: 2012-09-16 | Payer: Self-pay | Admitting: Gastroenterology

## 2012-09-16 SURGERY — MANOMETRY, ESOPHAGUS

## 2012-09-17 ENCOUNTER — Ambulatory Visit: Payer: Medicare Other | Admitting: Gastroenterology

## 2012-09-25 ENCOUNTER — Ambulatory Visit: Payer: Medicare Other | Admitting: Critical Care Medicine

## 2012-09-27 ENCOUNTER — Ambulatory Visit: Payer: Medicare Other | Admitting: Critical Care Medicine

## 2012-10-21 ENCOUNTER — Encounter (HOSPITAL_COMMUNITY): Admission: RE | Payer: Self-pay | Source: Ambulatory Visit

## 2012-10-21 ENCOUNTER — Ambulatory Visit (HOSPITAL_COMMUNITY): Admission: RE | Admit: 2012-10-21 | Payer: Medicare Other | Source: Ambulatory Visit | Admitting: Gastroenterology

## 2012-10-21 SURGERY — MANOMETRY, ESOPHAGUS

## 2013-04-16 ENCOUNTER — Ambulatory Visit: Payer: Medicare Other | Admitting: Critical Care Medicine

## 2013-04-25 IMAGING — CR DG CHEST 2V
2 series · 2 of 2 positions shown · non-contrast
Comparison: 07/23/2010

CLINICAL DATA: Chest pain, cough.

CHEST - 2 VIEW

[w chest pa]
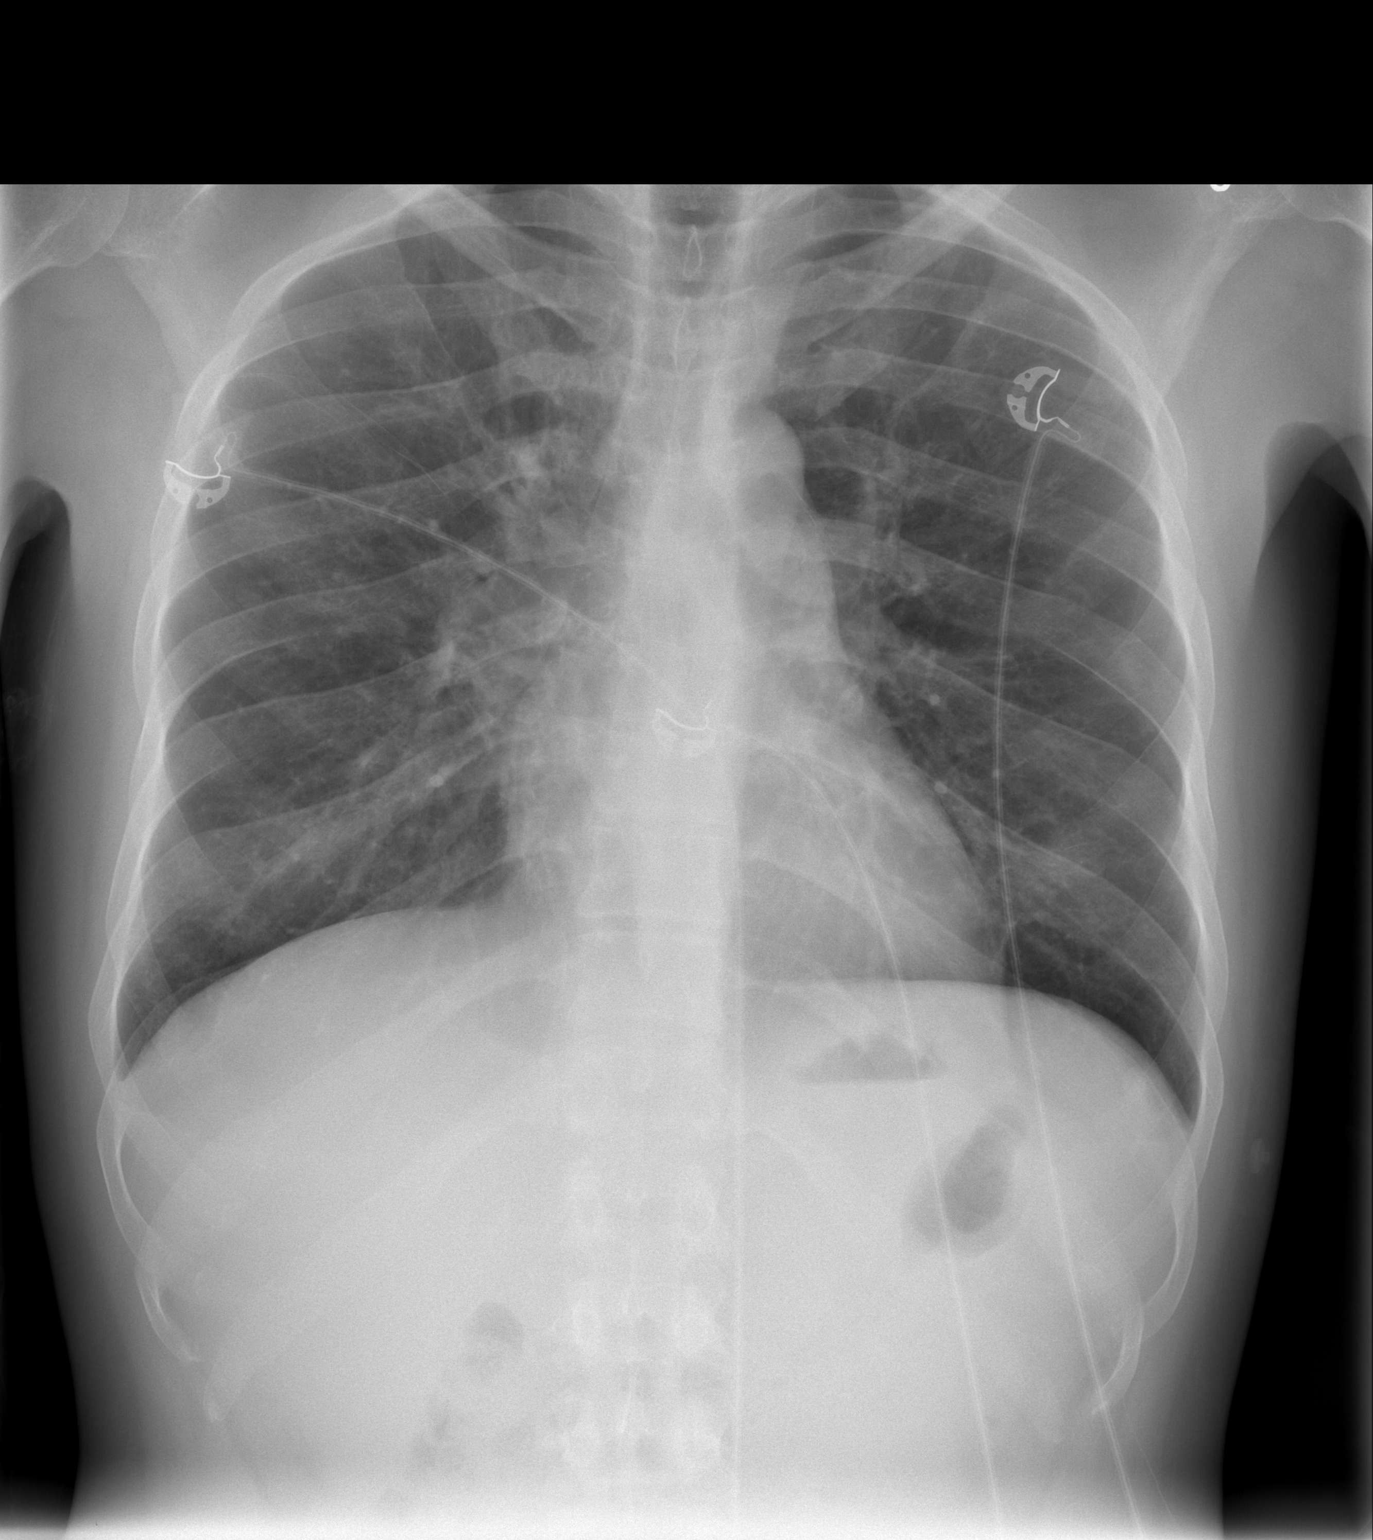

[w chest lat]
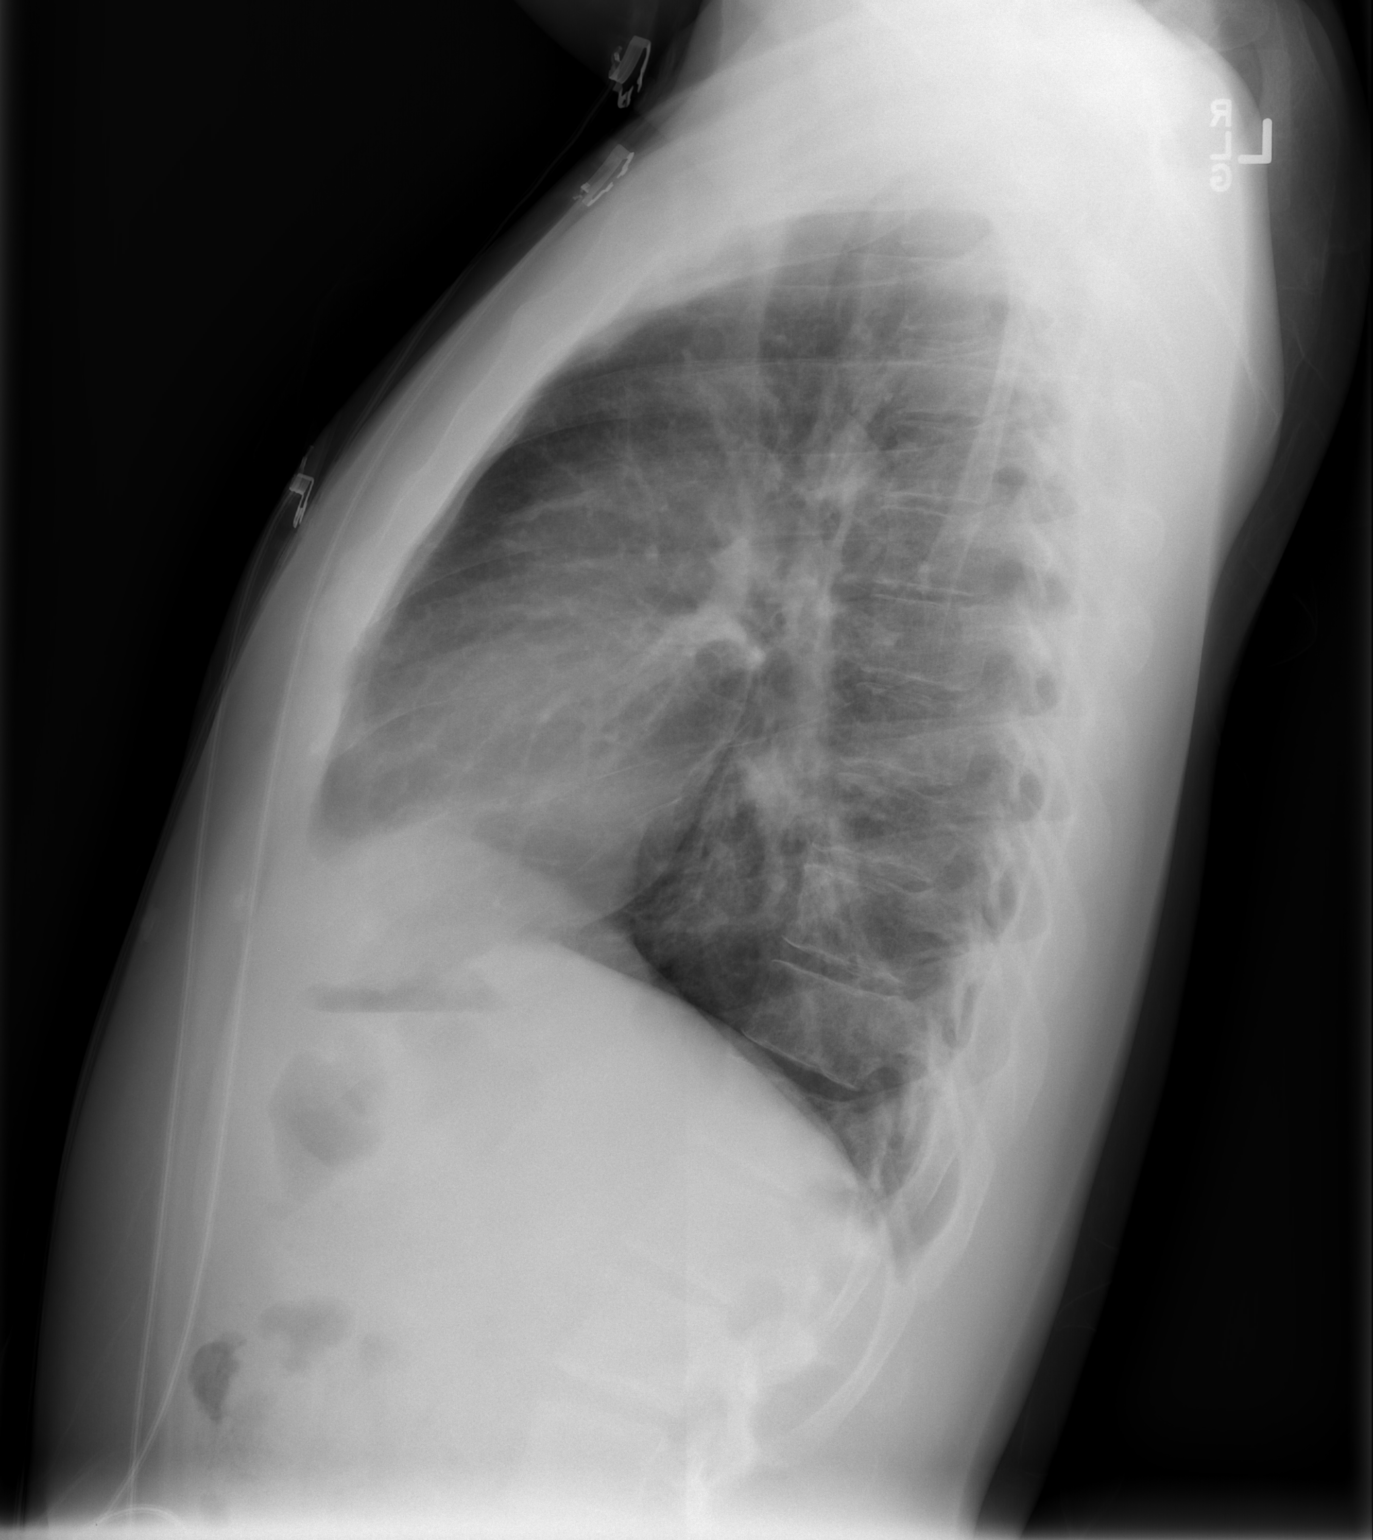

[2 of 2 positions shown; findings below may reference images not displayed]

FINDINGS: Right greater than left hilar fullness and peribronchial
thickening.  No focal consolidation elsewhere.  No pleural effusion
or pneumothorax.  Cardiac contours within normal limits.
IMPRESSION: Nonspecific hilar fullness and peribronchial thickening.  Cannot
exclude acute infection. Attention on follow-up to confirm
resolution.

## 2013-04-28 ENCOUNTER — Ambulatory Visit: Payer: Medicare Other | Admitting: Critical Care Medicine

## 2013-05-01 ENCOUNTER — Encounter: Payer: Self-pay | Admitting: Critical Care Medicine

## 2013-09-28 ENCOUNTER — Emergency Department (HOSPITAL_COMMUNITY)
Admission: EM | Admit: 2013-09-28 | Discharge: 2013-09-28 | Disposition: A | Discharge status: Home / Self Care | Payer: Medicare Other | Attending: Emergency Medicine | Admitting: Emergency Medicine

## 2013-09-28 ENCOUNTER — Encounter (HOSPITAL_COMMUNITY): Payer: Self-pay | Admitting: Emergency Medicine

## 2013-09-28 ENCOUNTER — Emergency Department (HOSPITAL_COMMUNITY): Payer: Medicare Other

## 2013-09-28 DIAGNOSIS — R05 Cough: Secondary | ICD-10-CM | POA: Diagnosis not present

## 2013-09-28 DIAGNOSIS — R197 Diarrhea, unspecified: Secondary | ICD-10-CM | POA: Insufficient documentation

## 2013-09-28 DIAGNOSIS — I10 Essential (primary) hypertension: Secondary | ICD-10-CM | POA: Insufficient documentation

## 2013-09-28 DIAGNOSIS — R6889 Other general symptoms and signs: Secondary | ICD-10-CM | POA: Diagnosis not present

## 2013-09-28 DIAGNOSIS — Z8739 Personal history of other diseases of the musculoskeletal system and connective tissue: Secondary | ICD-10-CM | POA: Insufficient documentation

## 2013-09-28 DIAGNOSIS — R110 Nausea: Secondary | ICD-10-CM | POA: Insufficient documentation

## 2013-09-28 DIAGNOSIS — IMO0002 Reserved for concepts with insufficient information to code with codable children: Secondary | ICD-10-CM | POA: Diagnosis not present

## 2013-09-28 DIAGNOSIS — R5381 Other malaise: Secondary | ICD-10-CM | POA: Insufficient documentation

## 2013-09-28 DIAGNOSIS — K219 Gastro-esophageal reflux disease without esophagitis: Secondary | ICD-10-CM | POA: Diagnosis not present

## 2013-09-28 DIAGNOSIS — J069 Acute upper respiratory infection, unspecified: Secondary | ICD-10-CM | POA: Diagnosis not present

## 2013-09-28 DIAGNOSIS — J45909 Unspecified asthma, uncomplicated: Secondary | ICD-10-CM | POA: Insufficient documentation

## 2013-09-28 DIAGNOSIS — Z87898 Personal history of other specified conditions: Secondary | ICD-10-CM | POA: Insufficient documentation

## 2013-09-28 DIAGNOSIS — Z79899 Other long term (current) drug therapy: Secondary | ICD-10-CM | POA: Insufficient documentation

## 2013-09-28 DIAGNOSIS — Z8768 Personal history of other (corrected) conditions arising in the perinatal period: Secondary | ICD-10-CM | POA: Insufficient documentation

## 2013-09-28 DIAGNOSIS — R112 Nausea with vomiting, unspecified: Secondary | ICD-10-CM | POA: Diagnosis not present

## 2013-09-28 DIAGNOSIS — R079 Chest pain, unspecified: Secondary | ICD-10-CM | POA: Diagnosis not present

## 2013-09-28 MED ORDER — OSELTAMIVIR PHOSPHATE 75 MG PO CAPS: 75.0000 mg | ORAL_CAPSULE | Freq: Two times a day (BID) | ORAL | Status: DC

## 2013-09-28 MED ORDER — IBUPROFEN 200 MG PO TABS
600.0000 mg | ORAL_TABLET | Freq: Once | ORAL | Status: AC
  Administered 2013-09-28: 600 mg via ORAL
  Filled 2013-09-28: qty 3

## 2013-09-28 MED ORDER — ONDANSETRON 8 MG PO TBDP
8.0000 mg | ORAL_TABLET | Freq: Once | ORAL | Status: AC
  Administered 2013-09-28: 8 mg via ORAL
  Filled 2013-09-28: qty 1

## 2013-09-28 NOTE — ED Provider Notes (Signed)
CSN: [id number]     Arrival date & time 09/28/13  [id number] History   First MD Initiated Contact with Patient 09/28/13 1023     Chief Complaint  Patient presents with  . Generalized Body Aches   (Consider location/radiation/quality/duration/timing/severity/associated sxs/prior Treatment) The history is provided by the patient.  pt c/o subjective fevers, diffuse body aches, nasal congestion, scratchy/sore throat, and occasional non productive cough for the past 1-2 days. No known ill contacts. No trouble breathing or swallowing. Had 1-2 loose stools in past day, no severe diarrhea. Nausea. No abd pain. No dysuria or gu c/o. No rash. Denies recent foreign travel.      Past Medical History  Diagnosis Date  . Lupus   . Asthma   . Bronchopulmonary dysplasia   . History of pericarditis   . PAROTITIS, RIGHT 09/30/2009    Qualifier: Diagnosis of  By: Joya Gaskins MD, Burnett Harry   . Hypertension   . GERD (gastroesophageal reflux disease)   . Esophagitis 08/19/2012    EGD  . Stricture esophagus 08/19/2012    EGD  . Hiatal hernia 08/19/2012    EGD   Past Surgical History  Procedure Laterality Date  . Tonsillectomy    . Wisdom tooth extraction     Family History  Problem Relation Age of Onset  . Lupus Father   . HIV Brother   . Thyroid cancer Maternal Aunt    History  Substance Use Topics  . Smoking status: Never Smoker   . Smokeless tobacco: Never Used  . Alcohol Use: Yes     Comment: Occasional wine    Review of Systems  Constitutional: Positive for fever.  HENT: Positive for congestion, rhinorrhea and sore throat. Negative for trouble swallowing and voice change.   Eyes: Negative for discharge and redness.  Respiratory: Positive for cough. Negative for shortness of breath.   Cardiovascular: Negative for chest pain and leg swelling.  Gastrointestinal: Positive for nausea and diarrhea. Negative for abdominal pain.  Genitourinary: Negative for dysuria and flank pain.   Musculoskeletal: Positive for myalgias. Negative for neck pain.  Skin: Negative for rash.  Neurological: Negative for headaches.  Hematological: Does not bruise/bleed easily.  Psychiatric/Behavioral: Negative for confusion.    Allergies  Review of patient's allergies indicates no known allergies.  Home Medications   Current Outpatient Rx  Name  Route  Sig  Dispense  Refill  . budesonide-formoterol (SYMBICORT) 80-4.5 MCG/ACT inhaler   Inhalation   Inhale 2 puffs into the lungs 2 (two) times daily.         . Calcium Carbonate (CALCIUM 500 PO)   Oral   Take 750 mg by mouth daily.         . Chlorphen-Pseudoephed-APAP (THERAFLU FLU/COLD PO)   Oral   Take 1 packet by mouth every 4 (four) hours as needed (flu-like system).         . hydroxychloroquine (PLAQUENIL) 200 MG tablet   Oral   Take 2 tablets (400 mg total) by mouth daily.   120 tablet   3   . lisinopril (PRINIVIL,ZESTRIL) 20 MG tablet   Oral   Take 20 mg by mouth daily.         . ondansetron (ZOFRAN) 4 MG tablet   Oral   Take 4 mg by mouth every 8 (eight) hours as needed for nausea or vomiting.          BP 115/104  Pulse 101  Temp(Src) 99.3 F (37.4 C) (Oral)  Resp 18  SpO2 100% Physical Exam  Nursing note and vitals reviewed. Constitutional: He is oriented to person, place, and time. He appears well-developed and well-nourished. No distress.  HENT:  Head: Atraumatic.  Mouth/Throat: Oropharynx is clear and moist. No oropharyngeal exudate.  Nasal congestion, no sinus tenderness/pressure.   Eyes: Conjunctivae are normal. Right eye exhibits no discharge. Left eye exhibits no discharge. No scleral icterus.  Neck: Normal range of motion. Neck supple. No tracheal deviation present.  No stiffness or rigidity.  Cardiovascular: Normal rate, normal heart sounds and intact distal pulses.  Exam reveals no gallop and no friction rub.   No murmur heard. Pulmonary/Chest: Effort normal and breath sounds  normal. No accessory muscle usage. No respiratory distress. He has no wheezes. He has no rales.  Abdominal: Soft. Bowel sounds are normal. He exhibits no distension and no mass. There is no tenderness. There is no rebound and no guarding.  No hsm.   Genitourinary:  No cva tenderness.   Musculoskeletal: Normal range of motion. He exhibits no edema and no tenderness.  Lymphadenopathy:    He has no cervical adenopathy.  Neurological: He is alert and oriented to person, place, and time.  Skin: Skin is warm and dry. He is not diaphoretic.  Psychiatric: He has a normal mood and affect.    ED Course  Procedures (including critical care time)  EKG Interpretation   None       MDM  Motrin po. zofran po. Po fluids.  Reviewed nursing notes and prior charts for additional history.   Pts hx/exam appear most c/w viral uri/flu syndrome.  Pt w no increased wob, appears adequately hydrated, and stable for d/c.       Mirna Mires, MD 09/28/13 [phone]

## 2013-09-28 NOTE — ED Notes (Signed)
He c/o cough/nausea (no vomiting) and a couple of loose stools; x 2 days.  He is in no distress; and has a mask on upon arrival, which we maintain.

## 2013-10-10 DIAGNOSIS — H1045 Other chronic allergic conjunctivitis: Secondary | ICD-10-CM | POA: Diagnosis not present

## 2013-10-14 IMAGING — CR DG CHEST 2V
2 series · 2 of 2 positions shown · non-contrast
Comparison: 07/26/2011.

CLINICAL DATA: Chest pain.

CHEST - 2 VIEW

[w chest pa]
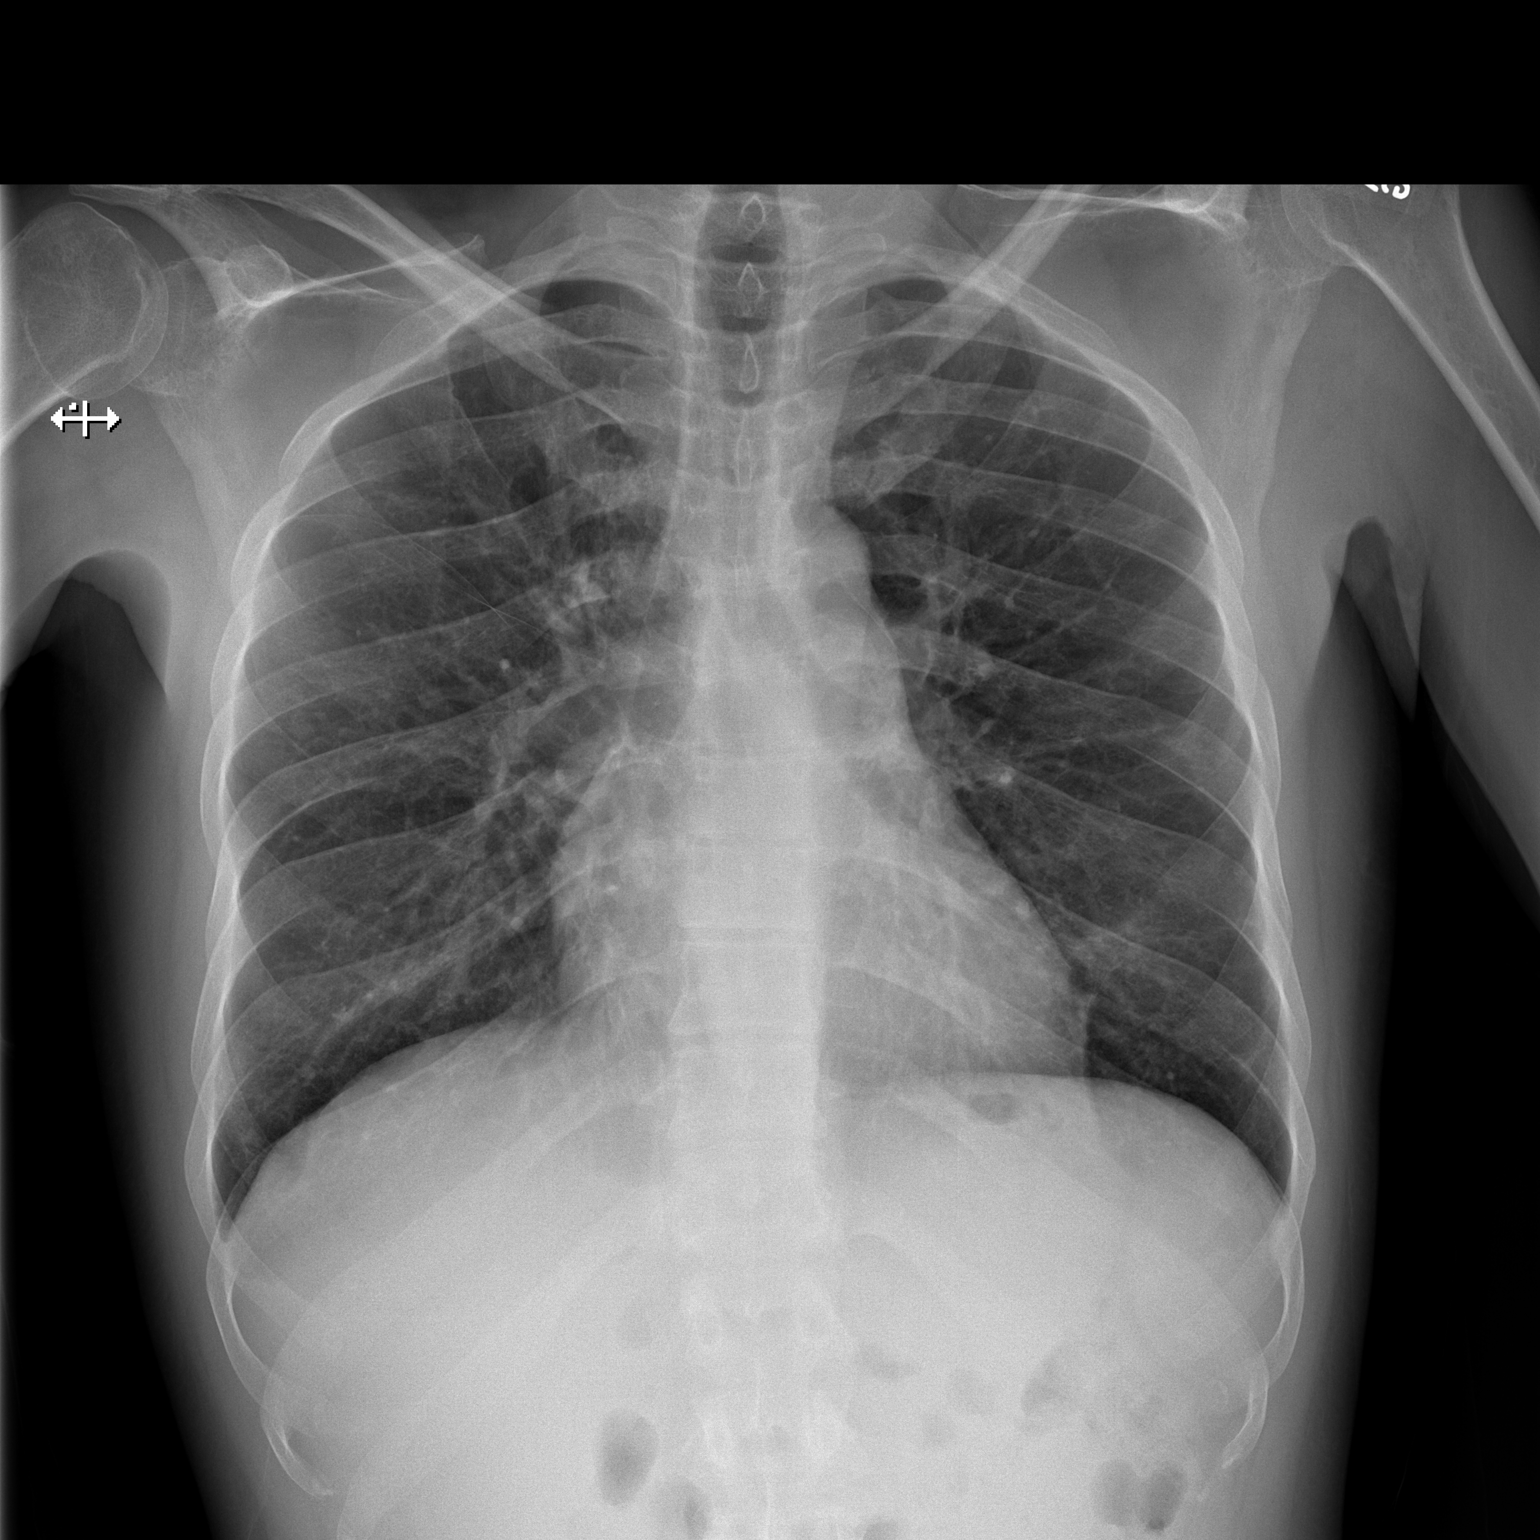

[w chest lat]
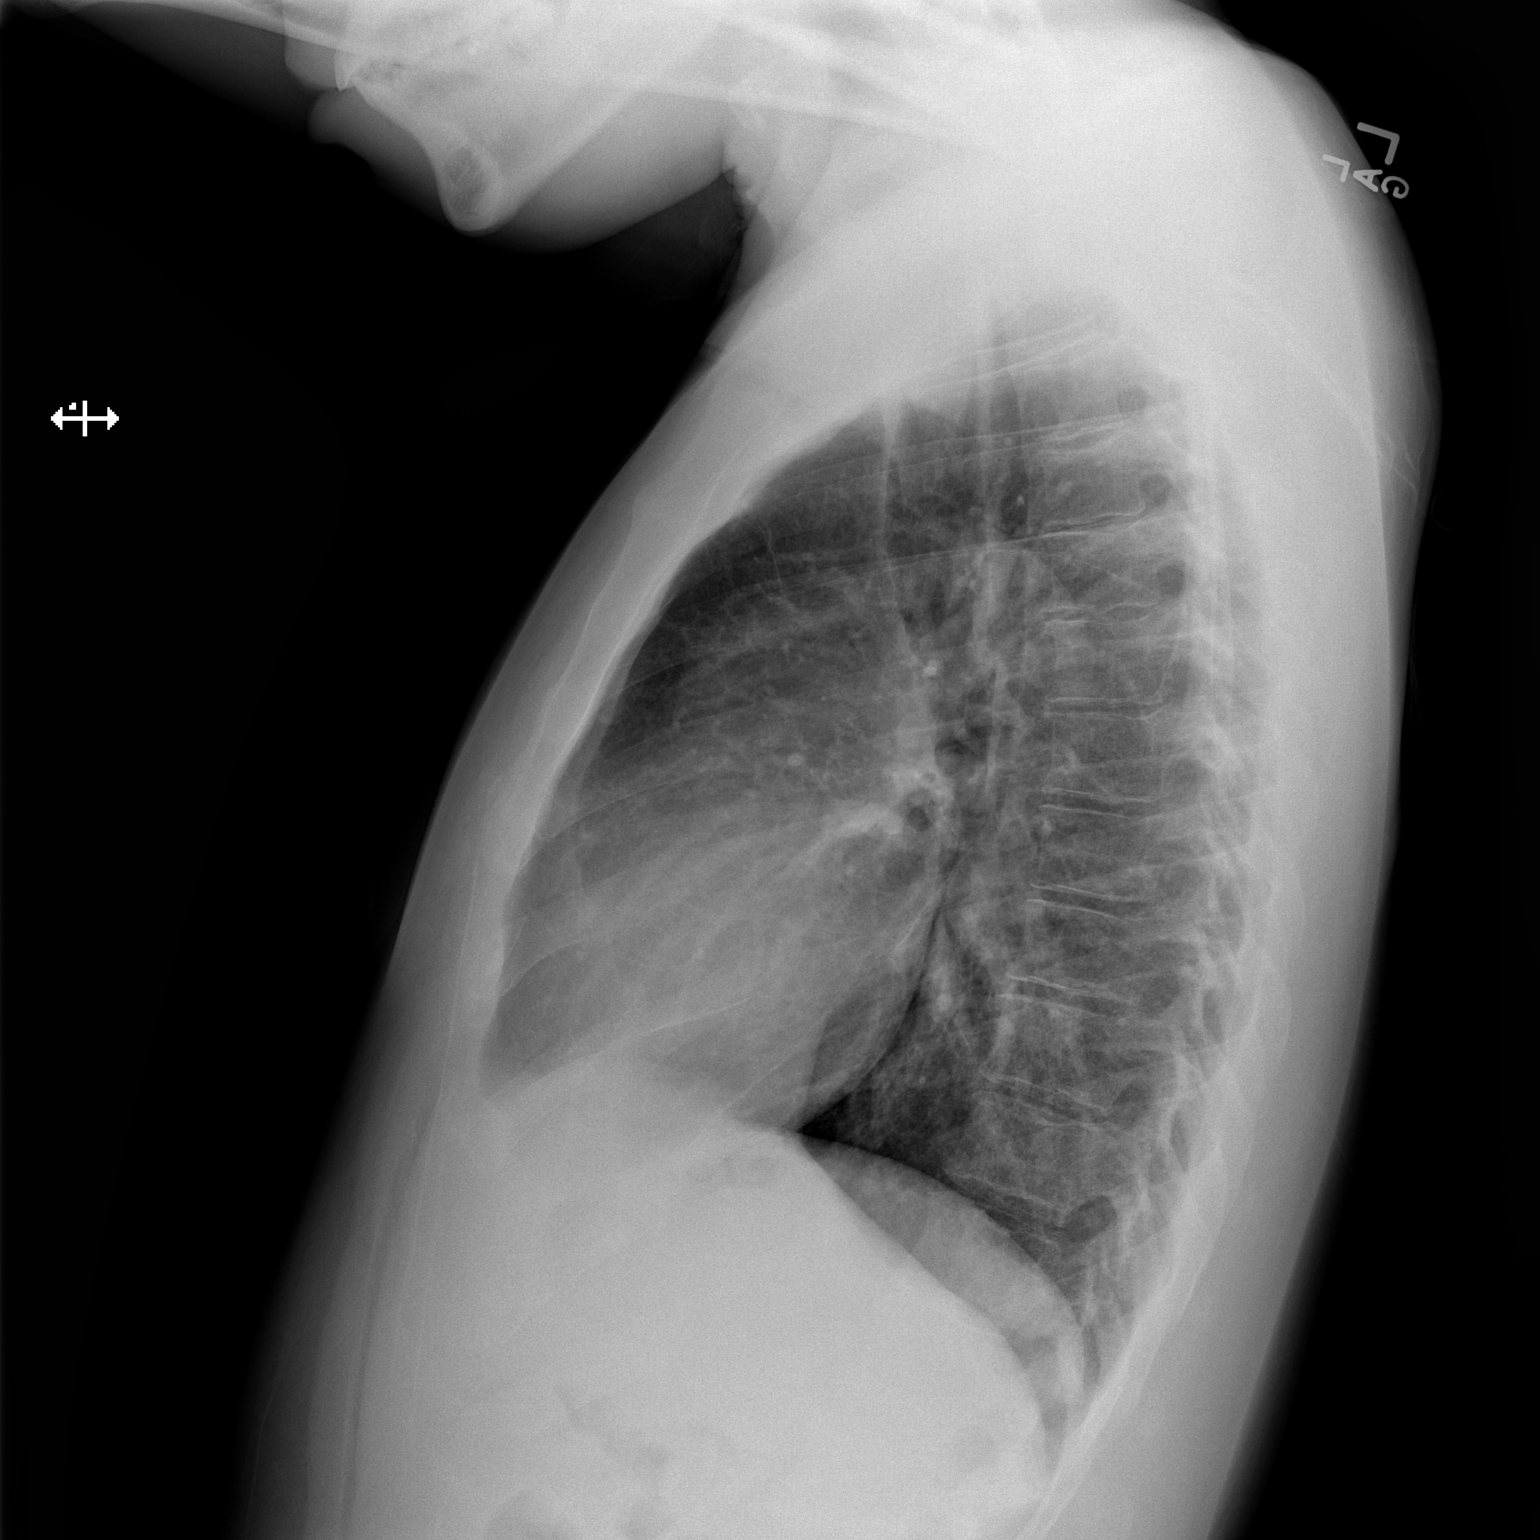

[2 of 2 positions shown; findings below may reference images not displayed]

FINDINGS: Chronic pulmonary parenchymal scarring and interstitial
opacity is present which appears similar to the prior exam.  No
airspace disease.  No effusion.  Cardiopericardial silhouette is
within normal limits.
IMPRESSION: No acute cardiopulmonary disease or interval change.  Chronic
pulmonary parenchymal scarring and interstitial changes.

## 2014-01-12 ENCOUNTER — Telehealth: Payer: Self-pay | Admitting: Critical Care Medicine

## 2014-01-12 ENCOUNTER — Encounter: Payer: Self-pay | Admitting: Emergency Medicine

## 2014-01-12 ENCOUNTER — Ambulatory Visit (INDEPENDENT_AMBULATORY_CARE_PROVIDER_SITE_OTHER)
Admission: RE | Admit: 2014-01-12 | Discharge: 2014-01-12 | Disposition: A | Discharge status: Home / Self Care | Payer: Medicare Other | Source: Ambulatory Visit | Attending: Emergency Medicine | Admitting: Emergency Medicine

## 2014-01-12 ENCOUNTER — Ambulatory Visit (INDEPENDENT_AMBULATORY_CARE_PROVIDER_SITE_OTHER): Payer: Medicare Other | Admitting: Emergency Medicine

## 2014-01-12 ENCOUNTER — Inpatient Hospital Stay (HOSPITAL_COMMUNITY)
Admission: EM | Admit: 2014-01-12 | Discharge: 2014-01-14 | DRG: 191 | Disposition: A | Discharge status: Home / Self Care | Payer: Medicare Other | Attending: Internal Medicine | Admitting: Internal Medicine

## 2014-01-12 VITALS — BP 140/92 | HR 128 | Ht 67.0 in | Wt 153.0 lb

## 2014-01-12 DIAGNOSIS — R059 Cough, unspecified: Secondary | ICD-10-CM | POA: Diagnosis not present

## 2014-01-12 DIAGNOSIS — N179 Acute kidney failure, unspecified: Secondary | ICD-10-CM | POA: Diagnosis present

## 2014-01-12 DIAGNOSIS — R0602 Shortness of breath: Secondary | ICD-10-CM | POA: Diagnosis not present

## 2014-01-12 DIAGNOSIS — J42 Unspecified chronic bronchitis: Secondary | ICD-10-CM | POA: Diagnosis not present

## 2014-01-12 DIAGNOSIS — J45909 Unspecified asthma, uncomplicated: Secondary | ICD-10-CM

## 2014-01-12 DIAGNOSIS — J209 Acute bronchitis, unspecified: Secondary | ICD-10-CM | POA: Diagnosis not present

## 2014-01-12 DIAGNOSIS — R911 Solitary pulmonary nodule: Secondary | ICD-10-CM

## 2014-01-12 DIAGNOSIS — Z79899 Other long term (current) drug therapy: Secondary | ICD-10-CM | POA: Diagnosis not present

## 2014-01-12 DIAGNOSIS — F17200 Nicotine dependence, unspecified, uncomplicated: Secondary | ICD-10-CM | POA: Diagnosis present

## 2014-01-12 DIAGNOSIS — E860 Dehydration: Secondary | ICD-10-CM | POA: Diagnosis present

## 2014-01-12 DIAGNOSIS — D649 Anemia, unspecified: Secondary | ICD-10-CM | POA: Diagnosis present

## 2014-01-12 DIAGNOSIS — Z6825 Body mass index (BMI) 25.0-25.9, adult: Secondary | ICD-10-CM

## 2014-01-12 DIAGNOSIS — E440 Moderate protein-calorie malnutrition: Secondary | ICD-10-CM | POA: Diagnosis present

## 2014-01-12 DIAGNOSIS — I10 Essential (primary) hypertension: Secondary | ICD-10-CM | POA: Diagnosis present

## 2014-01-12 DIAGNOSIS — J479 Bronchiectasis, uncomplicated: Secondary | ICD-10-CM | POA: Diagnosis not present

## 2014-01-12 DIAGNOSIS — I517 Cardiomegaly: Secondary | ICD-10-CM | POA: Diagnosis not present

## 2014-01-12 DIAGNOSIS — IMO0002 Reserved for concepts with insufficient information to code with codable children: Secondary | ICD-10-CM | POA: Diagnosis present

## 2014-01-12 DIAGNOSIS — J988 Other specified respiratory disorders: Secondary | ICD-10-CM | POA: Diagnosis not present

## 2014-01-12 DIAGNOSIS — K219 Gastro-esophageal reflux disease without esophagitis: Secondary | ICD-10-CM | POA: Diagnosis present

## 2014-01-12 DIAGNOSIS — R0902 Hypoxemia: Secondary | ICD-10-CM | POA: Diagnosis not present

## 2014-01-12 DIAGNOSIS — R05 Cough: Secondary | ICD-10-CM | POA: Diagnosis not present

## 2014-01-12 DIAGNOSIS — M329 Systemic lupus erythematosus, unspecified: Secondary | ICD-10-CM | POA: Diagnosis not present

## 2014-01-12 DIAGNOSIS — J471 Bronchiectasis with (acute) exacerbation: Principal | ICD-10-CM | POA: Diagnosis present

## 2014-01-12 LAB — CBC
HEMATOCRIT: 38.7 % — AB (ref 39.0–52.0)
HEMOGLOBIN: 12.6 g/dL — AB (ref 13.0–17.0)
MCH: 25.5 pg — ABNORMAL LOW (ref 26.0–34.0)
MCHC: 32.6 g/dL (ref 30.0–36.0)
MCV: 78.3 fL (ref 78.0–100.0)
Platelets: 207 10*3/uL (ref 150–400)
RBC: 4.94 MIL/uL (ref 4.22–5.81)
RDW: 14.6 % (ref 11.5–15.5)
WBC: 4.9 10*3/uL (ref 4.0–10.5)

## 2014-01-12 LAB — BASIC METABOLIC PANEL
BUN: 13 mg/dL (ref 6–23)
CO2: 23 mEq/L (ref 19–32)
Calcium: 8.6 mg/dL (ref 8.4–10.5)
Chloride: 101 mEq/L (ref 96–112)
Creatinine, Ser: 1.42 mg/dL — ABNORMAL HIGH (ref 0.50–1.35)
GFR calc Af Amer: 76 mL/min — ABNORMAL LOW (ref >90)
GFR, EST NON AFRICAN AMERICAN: 65 mL/min — AB (ref >90)
GLUCOSE: 111 mg/dL — AB (ref 70–99)
POTASSIUM: 3.7 meq/L (ref 3.7–5.3)
Sodium: 139 mEq/L (ref 137–147)

## 2014-01-12 LAB — PRO B NATRIURETIC PEPTIDE: Pro B Natriuretic peptide (BNP): 453.6 pg/mL — ABNORMAL HIGH (ref 0–125)

## 2014-01-12 LAB — I-STAT TROPONIN, ED: Troponin i, poc: 0.05 ng/mL (ref 0.00–0.08)

## 2014-01-12 MED ORDER — LEVOFLOXACIN 750 MG PO TABS: 750.0000 mg | ORAL_TABLET | Freq: Every day | ORAL | Status: DC

## 2014-01-12 MED ORDER — ALBUTEROL SULFATE HFA 108 (90 BASE) MCG/ACT IN AERS: 2.0000 | INHALATION_SPRAY | RESPIRATORY_TRACT | Status: DC | PRN

## 2014-01-12 MED ORDER — OMEPRAZOLE 20 MG PO CPDR: 20.0000 mg | DELAYED_RELEASE_CAPSULE | Freq: Every day | ORAL | Status: DC

## 2014-01-12 NOTE — Telephone Encounter (Signed)
Called and spoke with pt. He reports he is on his way to [facility] ED right now to be seen. He does not feel like he can wait untilt his afternoon. Will sign off message

## 2014-01-12 NOTE — Telephone Encounter (Signed)
Called spoke with pt. C/o increase SOB, can't seem to catch his breathe, prod cough w/ yellow phlem x 2 days, wheezing, chest tx, He has no inhalers. Last seen PW 07/2012. Requesting to be worked in today. No openings in office today and TP not in. Dr. Annamaria Boots please advise any recs? Thanks  No Known Allergies

## 2014-01-12 NOTE — ED Notes (Signed)
Per c/o shortness of breath, chest pain that increases with respirations, and generalized pain all over starting two days ago. Pt states he thinks his lupus is flaring up. Pt reports difficulty sleeping due to shortness of breath, pt's only position of comfort is sitting up using pursed lip breathing technique. Pt O2 saturation is 96% on room air. Pt is A&Ox4, respirations are equal and labored, skin warm and dry. Pt is up to date and compliant with all his medications.

## 2014-01-12 NOTE — Patient Instructions (Signed)
Take levaquin x 7 days Use albuterol 2 puffs up to every four hours if needed for shortness of breath CXR today Start omeprazole 20mg  daily Follow with Dr Joya Gaskins 2 -3 weeks

## 2014-01-12 NOTE — Assessment & Plan Note (Signed)
Sounds like acute bronchitis, possibly influenced by allergies, certainly influenced by GERD / dysphagia - CXR today - treat GERD - levaquin 750 x 7 days

## 2014-01-12 NOTE — Telephone Encounter (Signed)
Spoke with Meghan about this patient-patient can be worked in at 1:30pm slot with [place]. Thanks.

## 2014-01-12 NOTE — Assessment & Plan Note (Signed)
-   will need PFT, will wait to perform until after this acute illness is resolved - start back on prn SABA, wait to restart symbicort for now.  - follow with Dr Joya Gaskins to continue the asthma eval

## 2014-01-12 NOTE — Telephone Encounter (Signed)
Lorane Gell, CMA at 01/12/2014 9:22 AM     Status: Signed        Spoke with Meghan about this patient-patient can be worked in at 1:30pm slot with [place]. Thanks.   Called spoke with pt. appt scheduled

## 2014-01-12 NOTE — Progress Notes (Signed)
HPI:  Acute OV 01/12/14 --  33 yo patient with SLE (on immunosuppression) and bronchopulmonary dysplasia with bronchiectasis, reported asthma (I don't see spiro) last seen by Dr Joya Gaskins in 2013. He presents today with acute dyspnea over the last two days, inability to catch his breath. He has had persistent cough, productive of yellow mucous. He has B frontal / temporal HA. No nasal congestion or mucous. He hasn't been on Symbicort for last 6 months - he ran out. He also hasn't been taking his other meds > plaquenil, lisinopril, etc. Cough is worst when he lays flat, has kept him from sleeping. He also complains of dysphagia, food getting stuck, emesis after he eats.    Past Medical History  Diagnosis Date  . Lupus   . Asthma   . Bronchopulmonary dysplasia   . History of pericarditis   . PAROTITIS, RIGHT 09/30/2009    Qualifier: Diagnosis of  By: Joya Gaskins MD, Burnett Harry   . Hypertension   . GERD (gastroesophageal reflux disease)   . Esophagitis 08/19/2012    EGD  . Stricture esophagus 08/19/2012    EGD  . Hiatal hernia 08/19/2012    EGD     Family History  Problem Relation Age of Onset  . Lupus Father   . HIV Brother   . Thyroid cancer Maternal Aunt      History   Social History  . Marital Status: Single    Spouse Name: N/A    Number of Children: 0  . Years of Education: 11   Occupational History  . Disabled    Social History Main Topics  . Smoking status: Never Smoker   . Smokeless tobacco: Never Used  . Alcohol Use: Yes     Comment: Occasional wine  . Drug Use: No  . Sexual Activity: No   Other Topics Concern  . Not on file   Social History Narrative   Lives alone.  Independent.     No Known Allergies   Outpatient Prescriptions Prior to Visit  Medication Sig Dispense Refill  . Calcium Carbonate (CALCIUM 500 PO) Take 750 mg by mouth daily.      . budesonide-formoterol (SYMBICORT) 80-4.5 MCG/ACT inhaler Inhale 2 puffs into the lungs 2 (two) times daily.      .  hydroxychloroquine (PLAQUENIL) 200 MG tablet Take 2 tablets (400 mg total) by mouth daily.  120 tablet  3  . lisinopril (PRINIVIL,ZESTRIL) 20 MG tablet Take 20 mg by mouth daily.      . ondansetron (ZOFRAN) 4 MG tablet Take 4 mg by mouth every 8 (eight) hours as needed for nausea or vomiting.      . Chlorphen-Pseudoephed-APAP (THERAFLU FLU/COLD PO) Take 1 packet by mouth every 4 (four) hours as needed (flu-like system).      Marland Kitchen oseltamivir (TAMIFLU) 75 MG capsule Take 1 capsule (75 mg total) by mouth every 12 (twelve) hours.  10 capsule  0   No facility-administered medications prior to visit.    Filed Vitals:   01/12/14 1342  BP: 140/92  Pulse: 128  Height: 5\' 7"  (1.702 m)  Weight: 69.4 kg (153 lb)  SpO2: 90%   Gen: Pleasant, well-nourished, in no distress,  normal affect  ENT: No lesions,  mouth clear,  oropharynx clear, no postnasal drip  Neck: No JVD, no TMG, no carotid bruits  Lungs: No use of accessory muscles, few insp Bibasilar crackles  Cardiovascular: RRR, heart sounds normal, no murmur or gallops, no peripheral edema  Musculoskeletal: No  deformities, no cyanosis or clubbing  Neuro: alert, non focal  Skin: Warm, no lesions or rashes     CXR 09/28/13 --  COMPARISON: Chest x-ray 01/30/2012.  FINDINGS:  Mild diffuse peribronchial cuffing. Elevation of the minor fissure  appears to be chronic, suggesting chronic right upper lobe volume  loss, similar to prior study 01/30/2012. Bronchiectatic changes are  again noted in the medial aspect of the right upper lobe and in the  lower lobes of the lungs bilaterally. No definite acute  consolidative airspace disease is identified on today's examination.  Mild blunting of the costophrenic sulci is noted bilaterally, which  may represent mild chronic pleural parenchymal scarring, or could  indicate trace bilateral pleural effusions. No evidence of pulmonary  edema. Heart size is normal. Upper mediastinal contours are within   normal limits.  IMPRESSION:  1. Overall, the findings described above are likely chronic in this  individual, with multifocal bronchial wall thickening and areas of  mild cylindrical bronchiectasis as well as extensive right upper  lobe scarring and volume loss. No definite superimposed acute  cardiopulmonary disease is confidently noted at this time.    CT scan 01/20/12 --  Comparison: 01/20/2012; 04/09/2009  Findings: No filling defect is identified in the pulmonary arterial  tree to suggest pulmonary embolus.  Borderline prominent hilar and infrahilar nodal tissue noted,  stable. Scattered axillary and subpectoral lymph nodes are less  striking than on the prior exam from 2010. Large right middle lobe  noted.  There is a 6 mm right middle lobe pulmonary nodule which is stable  2010 and likely benign. Airway thickening is present bilaterally,  with central interstitial accentuation.  There is considerable airspace opacity in the left lower lobe,  compatible with pneumonia or pulmonary hemorrhage.  Mild mosaic attenuation in the lungs noted, possibly related to the  bronchitis. There is a suggestion of a faint peripheral  interstitial accentuation in the lung bases, potentially from  minimal fibrosis.  IMPRESSION:  1. Airspace opacity in the left lower lobe, potentially from  pneumonia or pulmonary hemorrhage.  2. No embolus is observed.  3. Airway thickening with some mosaic attenuation in the lungs.  There is an appearance resembling paraseptal emphysema, although  this may be more a manifestation of faint fibrosis and mosaic  attenuation from the bronchitis given that cystic lung changes  where an [place].  4. Stable 6 mm right middle lobe pulmonary nodule.   Acute bronchitis Sounds like acute bronchitis, possibly influenced by allergies, certainly influenced by GERD / dysphagia - CXR today - treat GERD - levaquin 750 x 7 days   BRONCHIECTASIS - need to repeat his  CT scan to look at the Btx and also for any ILD associated with SLE. Would not do this until the acute process is treated   Extrinsic asthma, unspecified - will need PFT, will wait to perform until after this acute illness is resolved - start back on prn SABA, wait to restart symbicort for now.  - follow with Dr Joya Gaskins to continue the asthma eval

## 2014-01-12 NOTE — Assessment & Plan Note (Signed)
-   need to repeat his CT scan to look at the Btx and also for any ILD associated with SLE. Would not do this until the acute process is treated

## 2014-01-13 ENCOUNTER — Encounter (HOSPITAL_COMMUNITY): Payer: Self-pay | Admitting: Emergency Medicine

## 2014-01-13 ENCOUNTER — Other Ambulatory Visit: Payer: Self-pay | Admitting: Pulmonary Disease

## 2014-01-13 ENCOUNTER — Other Ambulatory Visit: Payer: Self-pay | Admitting: Physician Assistant

## 2014-01-13 ENCOUNTER — Inpatient Hospital Stay (HOSPITAL_COMMUNITY): Payer: Medicare Other

## 2014-01-13 DIAGNOSIS — J479 Bronchiectasis, uncomplicated: Secondary | ICD-10-CM

## 2014-01-13 DIAGNOSIS — J42 Unspecified chronic bronchitis: Secondary | ICD-10-CM | POA: Diagnosis not present

## 2014-01-13 DIAGNOSIS — I517 Cardiomegaly: Secondary | ICD-10-CM

## 2014-01-13 DIAGNOSIS — M329 Systemic lupus erythematosus, unspecified: Secondary | ICD-10-CM

## 2014-01-13 DIAGNOSIS — R0902 Hypoxemia: Secondary | ICD-10-CM | POA: Insufficient documentation

## 2014-01-13 DIAGNOSIS — I10 Essential (primary) hypertension: Secondary | ICD-10-CM | POA: Diagnosis present

## 2014-01-13 DIAGNOSIS — R591 Generalized enlarged lymph nodes: Secondary | ICD-10-CM

## 2014-01-13 DIAGNOSIS — R0602 Shortness of breath: Secondary | ICD-10-CM

## 2014-01-13 DIAGNOSIS — J45909 Unspecified asthma, uncomplicated: Secondary | ICD-10-CM

## 2014-01-13 DIAGNOSIS — R911 Solitary pulmonary nodule: Secondary | ICD-10-CM

## 2014-01-13 DIAGNOSIS — J209 Acute bronchitis, unspecified: Secondary | ICD-10-CM

## 2014-01-13 LAB — CBC WITH DIFFERENTIAL/PLATELET
Basophils Absolute: 0 10*3/uL (ref 0.0–0.1)
Basophils Relative: 0 % (ref 0–1)
Eosinophils Absolute: 0 10*3/uL (ref 0.0–0.7)
Eosinophils Relative: 0 % (ref 0–5)
HEMATOCRIT: 35.8 % — AB (ref 39.0–52.0)
Hemoglobin: 11.6 g/dL — ABNORMAL LOW (ref 13.0–17.0)
LYMPHS ABS: 0.5 10*3/uL — AB (ref 0.7–4.0)
Lymphocytes Relative: 17 % (ref 12–46)
MCH: 25.2 pg — ABNORMAL LOW (ref 26.0–34.0)
MCHC: 32.4 g/dL (ref 30.0–36.0)
MCV: 77.7 fL — ABNORMAL LOW (ref 78.0–100.0)
MONO ABS: 0.1 10*3/uL (ref 0.1–1.0)
Monocytes Relative: 4 % (ref 3–12)
Neutro Abs: 2.4 10*3/uL (ref 1.7–7.7)
Neutrophils Relative %: 79 % — ABNORMAL HIGH (ref 43–77)
Platelets: 179 10*3/uL (ref 150–400)
RBC: 4.61 MIL/uL (ref 4.22–5.81)
RDW: 14.6 % (ref 11.5–15.5)
WBC: 3.1 10*3/uL — AB (ref 4.0–10.5)

## 2014-01-13 LAB — SODIUM, URINE, RANDOM: Sodium, Ur: 31 mEq/L

## 2014-01-13 LAB — INFLUENZA PANEL BY PCR (TYPE A & B)
H1N1 flu by pcr: NOT DETECTED
Influenza A By PCR: NEGATIVE
Influenza B By PCR: NEGATIVE

## 2014-01-13 LAB — URINE MICROSCOPIC-ADD ON

## 2014-01-13 LAB — COMPREHENSIVE METABOLIC PANEL
ALT: 19 U/L (ref 0–53)
AST: 36 U/L (ref 0–37)
Albumin: 2.8 g/dL — ABNORMAL LOW (ref 3.5–5.2)
Alkaline Phosphatase: 72 U/L (ref 39–117)
BUN: 16 mg/dL (ref 6–23)
CALCIUM: 8.5 mg/dL (ref 8.4–10.5)
CO2: 20 mEq/L (ref 19–32)
Chloride: 101 mEq/L (ref 96–112)
Creatinine, Ser: 1.54 mg/dL — ABNORMAL HIGH (ref 0.50–1.35)
GFR calc Af Amer: 68 mL/min — ABNORMAL LOW (ref >90)
GFR calc non Af Amer: 59 mL/min — ABNORMAL LOW (ref >90)
Glucose, Bld: 148 mg/dL — ABNORMAL HIGH (ref 70–99)
Potassium: 3.6 mEq/L — ABNORMAL LOW (ref 3.7–5.3)
SODIUM: 141 meq/L (ref 137–147)
Total Bilirubin: 0.2 mg/dL — ABNORMAL LOW (ref 0.3–1.2)
Total Protein: 7.8 g/dL (ref 6.0–8.3)

## 2014-01-13 LAB — URINALYSIS, ROUTINE W REFLEX MICROSCOPIC
BILIRUBIN URINE: NEGATIVE
Glucose, UA: NEGATIVE mg/dL
Ketones, ur: NEGATIVE mg/dL
Leukocytes, UA: NEGATIVE
NITRITE: NEGATIVE
PH: 5.5 (ref 5.0–8.0)
Protein, ur: 100 mg/dL — AB
Specific Gravity, Urine: 1.01 (ref 1.005–1.030)
Urobilinogen, UA: 0.2 mg/dL (ref 0.0–1.0)

## 2014-01-13 LAB — TROPONIN I
Troponin I: <0.3 ng/mL (ref <0.30)
Troponin I: <0.3 ng/mL (ref <0.30)

## 2014-01-13 LAB — TSH: TSH: 0.962 u[IU]/mL (ref 0.350–4.500)

## 2014-01-13 LAB — CK: Total CK: 498 U/L — ABNORMAL HIGH (ref 7–232)

## 2014-01-13 LAB — CREATININE, URINE, RANDOM: Creatinine, Urine: 106.11 mg/dL

## 2014-01-13 LAB — SEDIMENTATION RATE: Sed Rate: 30 mm/hr — ABNORMAL HIGH (ref 0–16)

## 2014-01-13 MED ORDER — ALBUTEROL SULFATE (2.5 MG/3ML) 0.083% IN NEBU: 2.5000 mg | INHALATION_SOLUTION | RESPIRATORY_TRACT | Status: DC | PRN

## 2014-01-13 MED ORDER — ONDANSETRON HCL 4 MG/2ML IJ SOLN: 4.0000 mg | Freq: Four times a day (QID) | INTRAMUSCULAR | Status: DC | PRN

## 2014-01-13 MED ORDER — LEVOFLOXACIN IN D5W 750 MG/150ML IV SOLN
750.0000 mg | INTRAVENOUS | Status: DC
  Administered 2014-01-13: 750 mg via INTRAVENOUS
  Filled 2014-01-13 (×3): qty 150

## 2014-01-13 MED ORDER — ACETAMINOPHEN 325 MG PO TABS
650.0000 mg | ORAL_TABLET | Freq: Four times a day (QID) | ORAL | Status: DC | PRN
  Administered 2014-01-13: 650 mg via ORAL
  Filled 2014-01-13: qty 2

## 2014-01-13 MED ORDER — SODIUM CHLORIDE 0.9 % IV SOLN
INTRAVENOUS | Status: AC
  Administered 2014-01-13 (×2): via INTRAVENOUS

## 2014-01-13 MED ORDER — IOHEXOL 350 MG/ML SOLN
80.0000 mL | Freq: Once | INTRAVENOUS | Status: AC | PRN
  Administered 2014-01-13: 66 mL via INTRAVENOUS

## 2014-01-13 MED ORDER — LEVALBUTEROL HCL 0.63 MG/3ML IN NEBU
0.6300 mg | INHALATION_SOLUTION | Freq: Four times a day (QID) | RESPIRATORY_TRACT | Status: DC
  Administered 2014-01-13 – 2014-01-14 (×5): 0.63 mg via RESPIRATORY_TRACT
  Filled 2014-01-13 (×10): qty 3

## 2014-01-13 MED ORDER — PREDNISONE 10 MG PO TABS
10.0000 mg | ORAL_TABLET | Freq: Every day | ORAL | Status: DC
  Administered 2014-01-14: 10 mg via ORAL
  Filled 2014-01-13 (×2): qty 1

## 2014-01-13 MED ORDER — MORPHINE SULFATE 4 MG/ML IJ SOLN
4.0000 mg | Freq: Once | INTRAMUSCULAR | Status: AC
  Administered 2014-01-13: 4 mg via INTRAVENOUS
  Filled 2014-01-13: qty 1

## 2014-01-13 MED ORDER — ONDANSETRON HCL 4 MG PO TABS: 4.0000 mg | ORAL_TABLET | Freq: Four times a day (QID) | ORAL | Status: DC | PRN

## 2014-01-13 MED ORDER — ALBUTEROL (5 MG/ML) CONTINUOUS INHALATION SOLN
10.0000 mg/h | INHALATION_SOLUTION | Freq: Once | RESPIRATORY_TRACT | Status: AC
  Administered 2014-01-13: 10 mg/h via RESPIRATORY_TRACT
  Filled 2014-01-13: qty 20

## 2014-01-13 MED ORDER — HYDROXYCHLOROQUINE SULFATE 200 MG PO TABS
400.0000 mg | ORAL_TABLET | Freq: Every day | ORAL | Status: DC
  Administered 2014-01-13 – 2014-01-14 (×2): 400 mg via ORAL
  Filled 2014-01-13 (×2): qty 2

## 2014-01-13 MED ORDER — LISINOPRIL 20 MG PO TABS
20.0000 mg | ORAL_TABLET | Freq: Every day | ORAL | Status: DC
  Administered 2014-01-13: 20 mg via ORAL
  Filled 2014-01-13: qty 1

## 2014-01-13 MED ORDER — PNEUMOCOCCAL VAC POLYVALENT 25 MCG/0.5ML IJ INJ
0.5000 mL | INJECTION | INTRAMUSCULAR | Status: DC
  Filled 2014-01-13: qty 0.5

## 2014-01-13 MED ORDER — ACETAMINOPHEN 650 MG RE SUPP: 650.0000 mg | Freq: Four times a day (QID) | RECTAL | Status: DC | PRN

## 2014-01-13 MED ORDER — SODIUM CHLORIDE 0.9 % IV BOLUS (SEPSIS)
500.0000 mL | Freq: Once | INTRAVENOUS | Status: AC
  Administered 2014-01-13: 500 mL via INTRAVENOUS

## 2014-01-13 MED ORDER — PREDNISONE 20 MG PO TABS
60.0000 mg | ORAL_TABLET | Freq: Once | ORAL | Status: AC
  Administered 2014-01-13: 60 mg via ORAL
  Filled 2014-01-13: qty 3

## 2014-01-13 MED ORDER — SODIUM CHLORIDE 0.9 % IJ SOLN
3.0000 mL | Freq: Two times a day (BID) | INTRAMUSCULAR | Status: DC
  Administered 2014-01-13: 3 mL via INTRAVENOUS

## 2014-01-13 MED ORDER — ENOXAPARIN SODIUM 40 MG/0.4ML ~~LOC~~ SOLN
40.0000 mg | SUBCUTANEOUS | Status: DC
  Administered 2014-01-13 – 2014-01-14 (×2): 40 mg via SUBCUTANEOUS
  Filled 2014-01-13 (×2): qty 0.4

## 2014-01-13 MED ORDER — IPRATROPIUM BROMIDE 0.02 % IN SOLN
0.5000 mg | Freq: Once | RESPIRATORY_TRACT | Status: AC
  Administered 2014-01-13: 0.5 mg via RESPIRATORY_TRACT
  Filled 2014-01-13: qty 2.5

## 2014-01-13 MED ORDER — ZOLPIDEM TARTRATE 5 MG PO TABS
5.0000 mg | ORAL_TABLET | Freq: Once | ORAL | Status: AC
  Administered 2014-01-13: 5 mg via ORAL
  Filled 2014-01-13: qty 1

## 2014-01-13 MED ORDER — LEVALBUTEROL HCL 0.63 MG/3ML IN NEBU: 0.6300 mg | INHALATION_SOLUTION | Freq: Four times a day (QID) | RESPIRATORY_TRACT | Status: DC | PRN

## 2014-01-13 NOTE — ED Notes (Signed)
Pt still receiving breathing breathing treatment.

## 2014-01-13 NOTE — Progress Notes (Signed)
ANTIBIOTIC CONSULT NOTE - INITIAL  Pharmacy Consult for Levaquin  Indication: Bronchitis  No Known Allergies  Patient Measurements: Height: 5\' 7"  (170.2 cm) Weight: 145 lb 6.4 oz (65.953 kg) IBW/kg (Calculated) : 66.1  Vital Signs: Temp: 99.4 F (37.4 C) (04/06 2214) Temp src: Oral (04/06 2214) BP: 158/81 mmHg (04/07 0630) Pulse Rate: 117 (04/07 0630)  Labs:  Recent Labs  01/12/14 2242  WBC 4.9  HGB 12.6*  PLT 207  CREATININE 1.42*   Estimated Creatinine Clearance: 71 ml/min (by C-G formula based on Cr of 1.42).  Medical History: Past Medical History  Diagnosis Date  . Lupus   . Asthma   . Bronchopulmonary dysplasia   . History of pericarditis   . PAROTITIS, RIGHT 09/30/2009    Qualifier: Diagnosis of  By: Joya Gaskins MD, Burnett Harry   . Hypertension   . GERD (gastroesophageal reflux disease)   . Esophagitis 08/19/2012    EGD  . Stricture esophagus 08/19/2012    EGD  . Hiatal hernia 08/19/2012    EGD    Assessment: 33 y/o M here with shortness of breath, to start Levaquin, WBC wnl, afebrile, Scr 1.42, other labs as above.   Plan:  -Levaquin 750 mg IV q24h -Trend WBC, temp, renal function   Narda Bonds 01/13/2014,7:34 AM

## 2014-01-13 NOTE — ED Notes (Signed)
MD at bedside. 

## 2014-01-13 NOTE — Progress Notes (Signed)
  Echocardiogram 2D Echocardiogram has been performed.  Donata Clay 01/13/2014, 2:10 PM

## 2014-01-13 NOTE — Progress Notes (Addendum)
Patient seen and examined, admitted by Dr. Hal Hope today morning  Briefly 33 year old male with history of lupus, pericarditis, bronchopulmonary dysplasia presented with chest pain and shortness of breath, tachycardiac and hypoxic. Chest x-ray negative for acute infiltrates, chest pain also appears to be pleuritic.  BP 143/90  Pulse 106  Temp(Src) 98.9 F (37.2 C) (Oral)  Resp 18  Ht 5\' 7"  (1.702 m)  Wt 70.444 kg (155 lb 4.8 oz)  BMI 24.32 kg/m2  SpO2 99%  Acute dyspnea/pleuritic chest pain - CT angiogram of the chest pending - BNP is 453, chest x-ray however does not show any pulmonary edema, obtain 2-D echo for further workup, any diastolic dysfunction or pericardial effusion. - Dr. Hal Hope has also called pulmonology consult - Patient also reports loss of appetite and loss of weight, about 20 pounds in the last 2 months unintentional  Mild Acute renal failure - Currently on IV hydration, will reassess    Binta Statzer M.D. [facility] 01/13/2014, 9:42 AM  Pager: [phone]   Angiogram of the chest reviewed  IMPRESSION:  Negative for pulmonary embolism.  6 mm right upper lobe nodule is stable.  Diffuse airway thickening is stable.  Bilateral hilar and mediastinal adenopathy shows mild progression.  Differential includes sarcoid, lymphoma, and reactive adenopathy.   Will follow, d/w Dr Rogene Houston for pulmonology consult     Terie Lear M.D. [facility] 01/13/2014, 12:17 PM  Pager: [phone]

## 2014-01-13 NOTE — Care Management Note (Addendum)
  Page 1 of 1   01/13/2014     2:54:30 PM   CARE MANAGEMENT NOTE 01/13/2014  Patient:  Jorge [name], Jorge [name]   Account Number:  [id number]  Date Initiated:  01/13/2014  Documentation initiated by:  Mariann Laster  Subjective/Objective Assessment:   Admitted with Shortness of breath and chest pain.     Action/Plan:   CM to follow for dispositon needs   Anticipated DC Date:  01/16/2014   Anticipated DC Plan:  HOME/SELF CARE         Choice offered to / List presented to:             Status of service:  In process, will continue to follow Medicare Important Message given?   (If response is "NO", the following Medicare IM given date fields will be blank) Date Medicare IM given:   Date Additional Medicare IM given:    Discharge Disposition:    Per UR Regulation:  Reviewed for med. necessity/level of care/duration of stay  If discussed at [place] of Stay Meetings, dates discussed:    Comments:  01/13/2014 Admitted with Shortness of breath and chest pain. Hx/o of Lupus, HTN, bronchopulmonary dysplasia, pericarditis levofloxacin (LEVAQUIN) IVPB 750 mg Disposition Pending Aftin Lye RN, BSN, MSHL, CCM 01/13/2014

## 2014-01-13 NOTE — Consult Note (Signed)
Name: Jorge [name] MRN: [id number] DOB: 20-Aug-1981    ADMISSION DATE:  01/12/2014 CONSULTATION DATE:  01/13/2014  REFERRING MD :  Jorge [name] PRIMARY SERVICE:  TRH  CHIEF COMPLAINT:  SOB  BRIEF PATIENT DESCRIPTION: 32 y.o. M presents with worsening SOB, chest pain, cough, fever x 2 days.  CTA showed stable 84mm RUL nodule but mild progression of bilateral hilar and mediastinal LAD.  PCCM consulted.  SIGNIFICANT EVENTS / STUDIES:  4/7 CTA Chest:  Neg for PE, stable 75mm RUL nodule, bilateral hilar/mediastinal LAD shows mild progression.  LINES / TUBES: PIV  CULTURES: None  ANTIBIOTICS: Levaquin 4/6 >>> Hydroxychloroquine ? >>>  HISTORY OF PRESENT ILLNESS:  Jorge [name] is a 33 y.o. M with PMH of SLE (on immunosupression), Asthma, Bronchopulmonary dysplasia, pericarditis, HTN, GERD, Esophageal stricture, hiatal hernia, presented on 4/7 with worsening SOB, chest pain, productive cough, fever x 2 days, although he states that his cough is basically chronic and occurs all the time. He has previously seen Dr. Joya [name] as an OP (last visit back in 2013).  Called into the office 4/6 asking to be seen, was seen by Dr. Lamonte [name] and treated with levaquin for what appeared to be acute bronchitis.  On morning of admit, he felt so bad that he ended up going to the ED.   Pt was admitted by [facility].  CTA was obtained and PE ruled out.  PCCM was consulted for progression of bilateral hilar and mediastinal LAD. During my exam, pt denies chest pain, N/V/D, abd pain, headaches, lightheadedness, fevers/chills/sweats, myalgias.  States that breathing is somewhat improved, still coughing up some yellow mucus.  Of note, pt does report a 30 lb weight loss that was unintentional along with loss of appetite.  Denies any hemoptysis or purulent sputum production.  He reports smoking marijuana daily.  PAST MEDICAL HISTORY :  Past Medical History  Diagnosis Date  . Lupus   . Asthma   . Bronchopulmonary dysplasia   . History of  pericarditis   . PAROTITIS, RIGHT 09/30/2009    Qualifier: Diagnosis of  By: Jorge Gaskins MD, Burnett Harry   . Hypertension   . GERD (gastroesophageal reflux disease)   . Esophagitis 08/19/2012    EGD  . Stricture esophagus 08/19/2012    EGD  . Hiatal hernia 08/19/2012    EGD   Past Surgical History  Procedure Laterality Date  . Tonsillectomy    . Wisdom tooth extraction     Prior to Admission medications   Medication Sig Start Date End Date Taking? Authorizing Provider  albuterol (PROVENTIL HFA;VENTOLIN HFA) 108 (90 BASE) MCG/ACT inhaler Inhale 2 puffs into the lungs every 4 (four) hours as needed for wheezing or shortness of breath.   Yes Historical Provider, MD  Calcium Carbonate (CALCIUM 500 PO) Take 750 mg by mouth daily.   Yes Historical Provider, MD  hydroxychloroquine (PLAQUENIL) 200 MG tablet Take 400 mg by mouth daily.   Yes Historical Provider, MD  levofloxacin (LEVAQUIN) 750 MG tablet Take 750 mg by mouth daily. 01/12/14  Yes Historical Provider, MD  lisinopril (PRINIVIL,ZESTRIL) 20 MG tablet Take 20 mg by mouth daily.   Yes Historical Provider, MD   No Known Allergies  FAMILY HISTORY:  Family History  Problem Relation Age of Onset  . Lupus Father   . HIV Brother   . Thyroid cancer Maternal Aunt    SOCIAL HISTORY:  reports that he has never smoked. He has never used smokeless tobacco. He reports that he drinks alcohol. He  reports that he does not use illicit drugs.  REVIEW OF SYSTEMS:    Negative except as stated in HPI.  SUBJECTIVE:   VITAL SIGNS: Temp:  [98.9 F (37.2 C)-99.4 F (37.4 C)] 98.9 F (37.2 C) (04/07 0739) Pulse Rate:  [95-130] 106 (04/07 0739) Resp:  [18-24] 18 (04/07 0739) BP: (123-172)/(68-121) 143/90 mmHg (04/07 0739) SpO2:  [86 %-100 %] 93 % (04/07 1108) Weight:  [145 lb 6.4 oz (65.953 kg)-155 lb 4.8 oz (70.444 kg)] 155 lb 4.8 oz (70.444 kg) (04/07 0739)  PHYSICAL EXAMINATION: General: Pleasant male, resting in bed, in NAD. Neuro: A&O x 3,  non-focal.  HEENT: [place]/AT. PERRL, sclerae anicteric. Cardiovascular: Tachy but regular, no M/R/G.  Lungs: Respirations even and unlabored.  Wheezes bilaterally. Abdomen: BS x 4, soft, NT/ND.  Musculoskeletal: No gross deformities, no edema.  Skin: Intact, warm, no rashes.     Recent Labs Lab 01/12/14 2242 01/13/14 0848  NA 139 141  K 3.7 3.6*  CL 101 101  CO2 23 20  BUN 13 16  CREATININE 1.42* 1.54*  GLUCOSE 111* 148*    Recent Labs Lab 01/12/14 2242 01/13/14 0848  HGB 12.6* 11.6*  HCT 38.7* 35.8*  WBC 4.9 3.1*  PLT 207 179   Dg Chest 2 View  01/12/2014   CLINICAL DATA:  Short of breath. Cough and congestion. History of lupus.  EXAM: CHEST  2 VIEW  COMPARISON:  09/28/2013.  FINDINGS: Cardiac silhouette is normal in size and configuration normal mediastinal contours. Prominent hila suggesting mild adenopathy. This is stable.  Prominent interstitial markings are also stable. Lungs are otherwise clear. No pleural effusion. No pneumothorax.  The bony thorax is intact.  IMPRESSION: No acute cardiopulmonary disease.   Electronically Signed   By: Lajean Manes M.D.   On: 01/12/2014 15:11   Ct Angio Chest Pe W/cm &/or Wo Cm  01/13/2014   CLINICAL DATA:  Short of breath  EXAM: CT ANGIOGRAPHY CHEST WITH CONTRAST  TECHNIQUE: Multidetector CT imaging of the chest was performed using the standard protocol during bolus administration of intravenous contrast. Multiplanar CT image reconstructions and MIPs were obtained to evaluate the vascular anatomy.  CONTRAST:  75mL OMNIPAQUE IOHEXOL 350 MG/ML SOLN  COMPARISON:  01/12/2014, CTA chest 01/20/2012  FINDINGS: Negative for pulmonary embolism. Negative for aortic dissection or aneurysm. Heart size mildly enlarged. No pericardial effusion.  Mild bilateral hilar and mediastinal adenopathy. Progression of right lower paratracheal and subcarinal adenopathy. Progression of mild hilar adenopathy bilaterally.  The 6 mm right upper lobe nodule is unchanged  from the prior study. This is noncalcified. This nodule was also present on 04/09/2009. No other nodules.  Diffuse airway thickening is present as seen on prior studies compatible with chronic bronchitis. No acute pneumonia. Negative for effusion.  Review of the MIP images confirms the above findings.  IMPRESSION: Negative for pulmonary embolism.  6 mm right upper lobe nodule is stable.  Diffuse airway thickening is stable.  Bilateral hilar and mediastinal adenopathy shows mild progression. Differential includes sarcoid, lymphoma, and reactive adenopathy.   Electronically Signed   By: Franchot Gallo M.D.   On: 01/13/2014 10:50    ASSESSMENT / PLAN:  Bronchitis Bronchopulmonary Dysplasia Bronchiectasis Mediastinal and Hilar Lymphadenopathy SLE Plan: - Continue Levaquin. - Continue Plaquenil. - Consider d/c Lisinopril given chronic cough, change to diff med. - Add Albuterol q3 PRN. - Add prednisone 50mg  x 3 days, then 40 x 3 days, then 30 x 3 days, then 20 x 3 days, then  10 daily. - Follow up Chest CT in 8 - 12 weeks, then pulmonary follow up with Dr. Joya [name].   Montey Hora, PA - [facility] Pgr: [phone]  or [phone]  Complex pulmonary history in a patient that continues to smoke.  Chronically not on steroids.  Agree with abx, smoking cessation, albuterol PRN as ordered, repeat CT in 8-12 (patient notified of possibility of cancer given weight loss but patient wanted to wait until repeat before any procedures).  Steroids as re commended above.  ?lisinopril as a cause of cough, may consider d/c that.  Patient seen and examined, agree with above note.  I dictated the care and orders written for this patient under my direction.  Rush Farmer, MD [phone] 01/13/2014, 1:54 PM

## 2014-01-13 NOTE — ED Provider Notes (Signed)
CSN: [id number]     Arrival date & time 01/12/14  2210 History   First MD Initiated Contact with Patient 01/13/14 0045     Chief Complaint  Patient presents with  . Lupus  . Shortness of Breath     (Consider location/radiation/quality/duration/timing/severity/associated sxs/prior Treatment) HPI Comments: 33 yo patient with SLE (on immunosuppression not taking it) and bronchopulmonary dysplasia with bronchiectasis, reported asthma. He presents today with acute dyspnea over the last two days, inability to catch his breath, inability to lay flat and chest pain. He has had persistent cough, productive of yellow mucous. Chest pain is described as throbbing pain, constant, and it is worse with breathing. No fevers, chills, + wheezing. Pt is not a smoker. Pt is not able to lay flat for the past few weeks. When supine, he starts coughing and is short of breath.  Patient is a 33 y.o. male presenting with shortness of breath. The history is provided by the patient and medical records.  Shortness of Breath Associated symptoms: chest pain and cough   Associated symptoms: no abdominal pain     Past Medical History  Diagnosis Date  . Lupus   . Asthma   . Bronchopulmonary dysplasia   . History of pericarditis   . PAROTITIS, RIGHT 09/30/2009    Qualifier: Diagnosis of  By: Joya Gaskins MD, Burnett Harry   . Hypertension   . GERD (gastroesophageal reflux disease)   . Esophagitis 08/19/2012    EGD  . Stricture esophagus 08/19/2012    EGD  . Hiatal hernia 08/19/2012    EGD   Past Surgical History  Procedure Laterality Date  . Tonsillectomy    . Wisdom tooth extraction     Family History  Problem Relation Age of Onset  . Lupus Father   . HIV Brother   . Thyroid cancer Maternal Aunt    History  Substance Use Topics  . Smoking status: Never Smoker   . Smokeless tobacco: Never Used  . Alcohol Use: Yes     Comment: Occasional wine    Review of Systems  Constitutional: Negative for activity  change and appetite change.  Respiratory: Positive for cough, chest tightness and shortness of breath. Negative for choking.   Cardiovascular: Positive for chest pain.  Gastrointestinal: Negative for abdominal pain.  Genitourinary: Negative for dysuria.      Allergies  Review of patient's allergies indicates no known allergies.  Home Medications   Current Outpatient Rx  Name  Route  Sig  Dispense  Refill  . albuterol (PROVENTIL HFA;VENTOLIN HFA) 108 (90 BASE) MCG/ACT inhaler   Inhalation   Inhale 2 puffs into the lungs every 4 (four) hours as needed for wheezing or shortness of breath.         . Calcium Carbonate (CALCIUM 500 PO)   Oral   Take 750 mg by mouth daily.         . hydroxychloroquine (PLAQUENIL) 200 MG tablet   Oral   Take 400 mg by mouth daily.         Marland Kitchen levofloxacin (LEVAQUIN) 750 MG tablet   Oral   Take 750 mg by mouth daily.         Marland Kitchen lisinopril (PRINIVIL,ZESTRIL) 20 MG tablet   Oral   Take 20 mg by mouth daily.          BP 166/121  Pulse 104  Temp(Src) 99.4 F (37.4 C) (Oral)  Resp 22  Ht 5\' 7"  (1.702 m)  Wt 145 lb 6.4  oz (65.953 kg)  BMI 22.77 kg/m2  SpO2 94% Physical Exam  Constitutional: He is oriented to person, place, and time. He appears well-developed.  HENT:  Head: Normocephalic and atraumatic.  Eyes: Conjunctivae and EOM are normal. Pupils are equal, round, and reactive to light.  Neck: Normal range of motion. Neck supple.  Cardiovascular: Normal rate and regular rhythm.   Pulmonary/Chest: Effort normal and breath sounds normal.  Abdominal: Soft. Bowel sounds are normal. He exhibits no distension. There is no tenderness. There is no rebound and no guarding.  Neurological: He is alert and oriented to person, place, and time.  Skin: Skin is warm.    ED Course  Procedures (including critical care time) Labs Review Labs Reviewed  CBC - Abnormal; Notable for the following:    Hemoglobin 12.6 (*)    HCT 38.7 (*)    MCH  25.5 (*)    All other components within normal limits  BASIC METABOLIC PANEL - Abnormal; Notable for the following:    Glucose, Bld 111 (*)    Creatinine, Ser 1.42 (*)    GFR calc non Af Amer 65 (*)    GFR calc Af Amer 76 (*)    All other components within normal limits  PRO B NATRIURETIC PEPTIDE - Abnormal; Notable for the following:    Pro B Natriuretic peptide (BNP) 453.6 (*)    All other components within normal limits  I-STAT TROPOININ, ED   Imaging Review Dg Chest 2 View  01/12/2014   CLINICAL DATA:  Short of breath. Cough and congestion. History of lupus.  EXAM: CHEST  2 VIEW  COMPARISON:  09/28/2013.  FINDINGS: Cardiac silhouette is normal in size and configuration normal mediastinal contours. Prominent hila suggesting mild adenopathy. This is stable.  Prominent interstitial markings are also stable. Lungs are otherwise clear. No pleural effusion. No pneumothorax.  The bony thorax is intact.  IMPRESSION: No acute cardiopulmonary disease.   Electronically Signed   By: Lajean Manes M.D.   On: 01/12/2014 15:11     EKG Interpretation None      Date: 01/13/2014  Rate: 113  Rhythm: sinus tachycardia  QRS Axis: normal  Intervals: normal  ST/T Wave abnormalities: nonspecific ST/T changes  Conduction Disutrbances:none  Narrative Interpretation:   Old EKG Reviewed: unchanged    MDM   Final diagnoses:  None    Differential diagnosis includes: ACS syndrome CHF exacerbation Valvular disorder Myocarditis Pericarditis Pericardial effusion Pneumonia Pleural effusion Pulmonary edema PE Anemia Musculoskeletal pain   Pt comes in with cc of dib, chest pain. Noted to be in sinus tachycardia, no signs of right sided failure. Pt has some diffuse wheezing. CXR done by [place] earlier - and was normal. Basic labs are fine.  Saw the pulm note - and seems like they want a CT done on the patient to evaluate his lung dz. I considered PE on the ddx - but i don't think i should  get a CT in the ER setting (pt is stable, pulm wants a CT, and patient has slightlyelevated Cr - i think we will do more harm than good by getting 2 CT scans).  We ambulated patient, and he becomes hypoxic 85%.  It seems like he is not taking optimal care of his lupus, and has underlying bronchopulm dysplasia as well - and his sx are likely just exacerbation of his existing condition.  Will admit, i think pulm consult will be helpful for optimal care, and diagnostic workup.  Varney Biles, MD  01/13/14 0625 

## 2014-01-13 NOTE — Progress Notes (Signed)
UR completed Radin Raptis K. Avaree Gilberti, RN, BSN, [place], CCM  01/13/2014 2:55 PM

## 2014-01-13 NOTE — H&P (Signed)
[facility] History and Physical  Jorge [name] [id number] DOB: 1981/06/30 DOA: 01/12/2014  Referring physician: ER physician. PCP: Philis Fendt, MD  Specialists: Dr. Lamonte Sakai. Pulmonologist.  Chief Complaint: Shortness of breath and chest pain.  HPI: Jorge [name] is a 33 y.o. male history of lupus, bronchopulmonary dysplasia, history of pericarditis presents to the ER because of worsening shortness of breath chest pain cough subjective feeling of fever over the last 2 days. Patient had gone to the pulmonologist office yesterday and was prescribed Levaquin for bronchitis despite taking which patient was still feeling sick and came to the ER. In the ER patient was found to be tachycardic and was getting easily hypoxic on exertion. Chest x-ray does not show any acute infiltrates. EKG shows sinus tachycardia. Patient's chest pain is pleuritic in nature and patient's shortness of breath chest pain and cough increases on lying down. Patient has been admitted for further workup. Patient denies any nausea vomiting abdominal pain diarrhea headache any focal deficits.   Review of Systems: As presented in the history of presenting illness, rest negative.  Past Medical History  Diagnosis Date  . Lupus   . Asthma   . Bronchopulmonary dysplasia   . History of pericarditis   . PAROTITIS, RIGHT 09/30/2009    Qualifier: Diagnosis of  By: Joya Gaskins MD, Burnett Harry   . Hypertension   . GERD (gastroesophageal reflux disease)   . Esophagitis 08/19/2012    EGD  . Stricture esophagus 08/19/2012    EGD  . Hiatal hernia 08/19/2012    EGD   Past Surgical History  Procedure Laterality Date  . Tonsillectomy    . Wisdom tooth extraction     Social History:  reports that he has never smoked. He has never used smokeless tobacco. He reports that he drinks alcohol. He reports that he does not use illicit drugs. Where does patient live home. Can patient participate in ADLs? Yes.  No Known  Allergies  Family History:  Family History  Problem Relation Age of Onset  . Lupus Father   . HIV Brother   . Thyroid cancer Maternal Aunt       Prior to Admission medications   Medication Sig Start Date End Date Taking? Authorizing Provider  albuterol (PROVENTIL HFA;VENTOLIN HFA) 108 (90 BASE) MCG/ACT inhaler Inhale 2 puffs into the lungs every 4 (four) hours as needed for wheezing or shortness of breath.   Yes Historical Provider, MD  Calcium Carbonate (CALCIUM 500 PO) Take 750 mg by mouth daily.   Yes Historical Provider, MD  hydroxychloroquine (PLAQUENIL) 200 MG tablet Take 400 mg by mouth daily.   Yes Historical Provider, MD  levofloxacin (LEVAQUIN) 750 MG tablet Take 750 mg by mouth daily. 01/12/14  Yes Historical Provider, MD  lisinopril (PRINIVIL,ZESTRIL) 20 MG tablet Take 20 mg by mouth daily.   Yes Historical Provider, MD    Physical Exam: Filed Vitals:   01/13/14 0500 01/13/14 0530 01/13/14 0600 01/13/14 0630  BP: 125/75 132/75 136/78 158/81  Pulse: 122 115 115 117  Temp:      TempSrc:      Resp:      Height:      Weight:      SpO2: 91% 93% 96% 95%     General:  Well-developed moderately nourished.  Eyes: Anicteric no pallor.  ENT: No discharge from the ears eyes nose mouth.  Neck: No mass felt.  Cardiovascular: S1-S2 heard tachycardic.  Respiratory: No rhonchi or crepitations.  Abdomen: Soft nontender bowel sounds  present. No guarding or rigidity.  Skin: No rash.  Musculoskeletal: No edema.  Psychiatric: Appears normal.  Neurologic: Alert awake oriented to time place and person. Moves all extremities.  Labs on Admission:  Basic Metabolic Panel:  Recent Labs Lab 01/12/14 2242  NA 139  K 3.7  CL 101  CO2 23  GLUCOSE 111*  BUN 13  CREATININE 1.42*  CALCIUM 8.6   Liver Function Tests: No results found for this basename: AST, ALT, ALKPHOS, BILITOT, PROT, ALBUMIN,  in the last 168 hours No results found for this basename: LIPASE, AMYLASE,   in the last 168 hours No results found for this basename: AMMONIA,  in the last 168 hours CBC:  Recent Labs Lab 01/12/14 2242  WBC 4.9  HGB 12.6*  HCT 38.7*  MCV 78.3  PLT 207   Cardiac Enzymes: No results found for this basename: CKTOTAL, CKMB, CKMBINDEX, TROPONINI,  in the last 168 hours  BNP (last 3 results)  Recent Labs  01/12/14 2242  PROBNP 453.6*   CBG: No results found for this basename: GLUCAP,  in the last 168 hours  Radiological Exams on Admission: Dg Chest 2 View  01/12/2014   CLINICAL DATA:  Short of breath. Cough and congestion. History of lupus.  EXAM: CHEST  2 VIEW  COMPARISON:  09/28/2013.  FINDINGS: Cardiac silhouette is normal in size and configuration normal mediastinal contours. Prominent hila suggesting mild adenopathy. This is stable.  Prominent interstitial markings are also stable. Lungs are otherwise clear. No pleural effusion. No pneumothorax.  The bony thorax is intact.  IMPRESSION: No acute cardiopulmonary disease.   Electronically Signed   By: Lajean Manes M.D.   On: 01/12/2014 15:11    EKG: Independently reviewed. Sinus tachycardia.  Assessment/Plan Principal Problem:   Shortness of breath Active Problems:   Extrinsic asthma, unspecified   BRONCHIECTASIS   Lupus   HTN (hypertension)   1. Shortness of breath with pleuritic type of chest pain and tachycardia - primary we need to rule out pulmonary embolism. For which I have discussed with radiologist given patient's renal failure and increased creatinine. At this time radiologist feels that patient CT angiogram. I have ordered CT of the chest to rule out PE and also look into if patient has any interstitial lung disease. Check influenza PCR. Continue with Levaquin. Since patient has pleuritic type of chest pain and has had pericarditis previously we will get 2-D echo to check for any pericardial effusion and also LV function. I did discuss with on-call pulmonologist Dr. Halford Chessman. 2. Renal failure -  check urinalysis and FeNa. Closely follow intake output and metabolic panel. I have ordered 500 mL bolus provided a CT angiogram chest. Continue with gentle hydration. 3. Hypertension - continue lisinopril may have to hold if creatinine worsens. 4. SLE - patient is taking hydroxychloroquine. Check C4-C3 and sedimentation rate to make sure there is no exacerbation. 5. Anemia - follow CBC. If there is any further worsening may have to look for hemolysis.  I have discussed with on-call pulmonologist and radiologist. I have reviewed patient's old charts and labs.  Code Status: Full code.  Family Communication: None.  Disposition Plan: Admit to inpatient.    Tytionna Cloyd N. [facility] Pager [phone].  If 7PM-7AM, please contact night-coverage [email or web address] Password [facility] 01/13/2014, 7:26 AM

## 2014-01-13 NOTE — ED Notes (Signed)
Ambulated patient in the hallway. O2 saturation ranged from 90% to a low of 86%. HR went from 130 to 148. Patient states he feels "weak and shakey".

## 2014-01-14 DIAGNOSIS — E440 Moderate protein-calorie malnutrition: Secondary | ICD-10-CM | POA: Insufficient documentation

## 2014-01-14 DIAGNOSIS — M329 Systemic lupus erythematosus, unspecified: Secondary | ICD-10-CM | POA: Diagnosis not present

## 2014-01-14 DIAGNOSIS — J209 Acute bronchitis, unspecified: Secondary | ICD-10-CM | POA: Diagnosis not present

## 2014-01-14 DIAGNOSIS — R0902 Hypoxemia: Secondary | ICD-10-CM | POA: Diagnosis not present

## 2014-01-14 DIAGNOSIS — J479 Bronchiectasis, uncomplicated: Secondary | ICD-10-CM | POA: Diagnosis not present

## 2014-01-14 DIAGNOSIS — I10 Essential (primary) hypertension: Secondary | ICD-10-CM | POA: Diagnosis not present

## 2014-01-14 LAB — BASIC METABOLIC PANEL
BUN: 19 mg/dL (ref 6–23)
CO2: 21 meq/L (ref 19–32)
CREATININE: 1.54 mg/dL — AB (ref 0.50–1.35)
Calcium: 8.6 mg/dL (ref 8.4–10.5)
Chloride: 107 mEq/L (ref 96–112)
GFR calc Af Amer: 68 mL/min — ABNORMAL LOW (ref >90)
GFR calc non Af Amer: 59 mL/min — ABNORMAL LOW (ref >90)
Glucose, Bld: 92 mg/dL (ref 70–99)
Potassium: 4.1 mEq/L (ref 3.7–5.3)
Sodium: 142 mEq/L (ref 137–147)

## 2014-01-14 LAB — CBC
HEMATOCRIT: 37.9 % — AB (ref 39.0–52.0)
Hemoglobin: 12.4 g/dL — ABNORMAL LOW (ref 13.0–17.0)
MCH: 25.5 pg — ABNORMAL LOW (ref 26.0–34.0)
MCHC: 32.7 g/dL (ref 30.0–36.0)
MCV: 78 fL (ref 78.0–100.0)
Platelets: 175 10*3/uL (ref 150–400)
RBC: 4.86 MIL/uL (ref 4.22–5.81)
RDW: 14.7 % (ref 11.5–15.5)
WBC: 4.5 10*3/uL (ref 4.0–10.5)

## 2014-01-14 LAB — HIV ANTIBODY (ROUTINE TESTING W REFLEX): HIV: NONREACTIVE

## 2014-01-14 LAB — C3 COMPLEMENT: C3 Complement: 58 mg/dL — ABNORMAL LOW (ref 90–180)

## 2014-01-14 LAB — C4 COMPLEMENT: Complement C4, Body Fluid: 18 mg/dL (ref 10–40)

## 2014-01-14 MED ORDER — GUAIFENESIN-DM 100-10 MG/5ML PO SYRP
5.0000 mL | ORAL_SOLUTION | ORAL | Status: DC | PRN
  Administered 2014-01-14: 5 mL via ORAL
  Filled 2014-01-14: qty 5

## 2014-01-14 MED ORDER — AMLODIPINE BESYLATE 10 MG PO TABS: 10.0000 mg | ORAL_TABLET | Freq: Every day | ORAL | Status: DC

## 2014-01-14 MED ORDER — SODIUM CHLORIDE 0.9 % IV SOLN: INTRAVENOUS | Status: DC

## 2014-01-14 MED ORDER — ENSURE COMPLETE PO LIQD
237.0000 mL | Freq: Two times a day (BID) | ORAL | Status: DC
  Administered 2014-01-14: 237 mL via ORAL

## 2014-01-14 MED ORDER — PREDNISONE 10 MG PO TABS: ORAL_TABLET | ORAL | Status: DC

## 2014-01-14 MED ORDER — HYDROXYCHLOROQUINE SULFATE 200 MG PO TABS: 400.0000 mg | ORAL_TABLET | Freq: Every day | ORAL | Status: DC

## 2014-01-14 MED ORDER — AMLODIPINE BESYLATE 10 MG PO TABS
10.0000 mg | ORAL_TABLET | Freq: Every day | ORAL | Status: DC
  Administered 2014-01-14: 10 mg via ORAL
  Filled 2014-01-14: qty 1

## 2014-01-14 MED ORDER — ALBUTEROL SULFATE HFA 108 (90 BASE) MCG/ACT IN AERS: 2.0000 | INHALATION_SPRAY | RESPIRATORY_TRACT | Status: DC | PRN

## 2014-01-14 MED ORDER — ENSURE COMPLETE PO LIQD
237.0000 mL | Freq: Three times a day (TID) | ORAL | Status: DC
Start: 2014-01-14 — End: 2014-01-14
  Administered 2014-01-14: 237 mL via ORAL

## 2014-01-14 MED ORDER — PREDNISONE 50 MG PO TABS
50.0000 mg | ORAL_TABLET | Freq: Every day | ORAL | Status: DC
  Filled 2014-01-14: qty 1

## 2014-01-14 MED ORDER — ENSURE COMPLETE PO LIQD: 237.0000 mL | Freq: Two times a day (BID) | ORAL | Status: DC

## 2014-01-14 MED ORDER — LEVOFLOXACIN 750 MG PO TABS: 750.0000 mg | ORAL_TABLET | Freq: Every day | ORAL | Status: DC

## 2014-01-14 NOTE — Progress Notes (Signed)
Name: Jorge [name] MRN: [id number] DOB: 12/09/1980    ADMISSION DATE:  01/12/2014 CONSULTATION DATE:  01/13/2014  REFERRING MD :  Tana Coast PRIMARY SERVICE:  TRH  CHIEF COMPLAINT:  SOB  BRIEF PATIENT DESCRIPTION: 33 y.o. M presents with worsening SOB, chest pain, cough, fever x 2 days.  CTA showed stable 90mm RUL nodule but mild progression of bilateral hilar and mediastinal LAD.  PCCM consulted.  SIGNIFICANT EVENTS / STUDIES:  4/7 - CTA Chest:  Neg for PE, stable 50mm RUL nodule, bilateral hilar/mediastinal LAD shows mild progression.   CULTURES:   ANTIBIOTICS: Levaquin 4/6 >>>   SUBJECTIVE: Pt concerned regarding wait period for CT scan.  Questioning if he should go ahead and have bronch to r/o malignancy.  Denies HIV risk factors, reports few episodes of night sweats, denies birds.  Has lost from 173 to 125 over the past 6 months  VITAL SIGNS: Temp:  [98 F (36.7 C)-99.3 F (37.4 C)] 98 F (36.7 C) (04/08 0600) Pulse Rate:  [94-100] 94 (04/08 0600) Resp:  [18-21] 21 (04/08 0600) BP: (146-155)/(93-109) 150/109 mmHg (04/08 0600) SpO2:  [93 %-98 %] 96 % (04/08 0758) Weight:  [159 lb 13.3 oz (72.5 kg)] 159 lb 13.3 oz (72.5 kg) (04/08 0600)  PHYSICAL EXAMINATION: General: Pleasant male, in NAD. Neuro: A&O x 3, non-focal.  HEENT: [place]/AT. PERRL, sclerae anicteric. Cardiovascular: Tachy but regular, no M/R/G.  Lungs: Respirations even and unlabored.  Wheezing improved bilaterally Abdomen: BS x 4, soft, NT/ND.  Musculoskeletal: No gross deformities, no edema.  Skin: Intact, warm, no rashes.   Recent Labs Lab 01/12/14 2242 01/13/14 0848 01/14/14 0420  NA 139 141 142  K 3.7 3.6* 4.1  CL 101 101 107  CO2 23 20 21   BUN 13 16 19   CREATININE 1.42* 1.54* 1.54*  GLUCOSE 111* 148* 92    Recent Labs Lab 01/12/14 2242 01/13/14 0848 01/14/14 0420  HGB 12.6* 11.6* 12.4*  HCT 38.7* 35.8* 37.9*  WBC 4.9 3.1* 4.5  PLT 207 179 175   Dg Chest 2 View  01/12/2014   CLINICAL  DATA:  Short of breath. Cough and congestion. History of lupus.  EXAM: CHEST  2 VIEW  COMPARISON:  09/28/2013.  FINDINGS: Cardiac silhouette is normal in size and configuration normal mediastinal contours. Prominent hila suggesting mild adenopathy. This is stable.  Prominent interstitial markings are also stable. Lungs are otherwise clear. No pleural effusion. No pneumothorax.  The bony thorax is intact.  IMPRESSION: No acute cardiopulmonary disease.   Electronically Signed   By: Lajean Manes M.D.   On: 01/12/2014 15:11   Ct Angio Chest Pe W/cm &/or Wo Cm  01/13/2014   CLINICAL DATA:  Short of breath  EXAM: CT ANGIOGRAPHY CHEST WITH CONTRAST  TECHNIQUE: Multidetector CT imaging of the chest was performed using the standard protocol during bolus administration of intravenous contrast. Multiplanar CT image reconstructions and MIPs were obtained to evaluate the vascular anatomy.  CONTRAST:  4mL OMNIPAQUE IOHEXOL 350 MG/ML SOLN  COMPARISON:  01/12/2014, CTA chest 01/20/2012  FINDINGS: Negative for pulmonary embolism. Negative for aortic dissection or aneurysm. Heart size mildly enlarged. No pericardial effusion.  Mild bilateral hilar and mediastinal adenopathy. Progression of right lower paratracheal and subcarinal adenopathy. Progression of mild hilar adenopathy bilaterally.  The 6 mm right upper lobe nodule is unchanged from the prior study. This is noncalcified. This nodule was also present on 04/09/2009. No other nodules.  Diffuse airway thickening is present as seen on prior studies compatible  with chronic bronchitis. No acute pneumonia. Negative for effusion.  Review of the MIP images confirms the above findings.  IMPRESSION: Negative for pulmonary embolism.  6 mm right upper lobe nodule is stable.  Diffuse airway thickening is stable.  Bilateral hilar and mediastinal adenopathy shows mild progression. Differential includes sarcoid, lymphoma, and reactive adenopathy.   Electronically Signed   By: Franchot Gallo M.D.   On: 01/13/2014 10:50    ASSESSMENT / PLAN:  Acute Bronchitis Bronchopulmonary Dysplasia Bronchiectasis Mediastinal and Hilar Lymphadenopathy SLE  Plan: - Continue Levaquin. - Continue Plaquenil. - Consider d/c Lisinopril given chronic cough, change to diff med. - Albuterol q3 PRN. - Prednisone 50 mg x 3 days, then 40 x 3 days, then 30 x 3 days, then 20 x 3 days, then 10 daily. - F/U Chest CT in 8 - 12 weeks (arranged, office will call pt with time), then pulmonary follow up with Dr. Joya Gaskins vs pursue FOB at pt request - Marijuana cessation - assess HIV status, HcG/AFP in setting of LAN + weight loss in young male  Noe Gens, NP-C [facility] Pgr: [phone] or [phone]  01/14/2014, 10:25 AM  Note revived.  I discussed with Dr. Dyann Kief over the phone.  It appears that it is patient's preference to pursue bronchoscopic evaluation this admission.  That can be performed as outpatient.  He was set up to see Dr. Joya Gaskins in follow up and discuss this.   Doree Fudge, MD [facility]monary and [place] Pager: [phone]

## 2014-01-14 NOTE — Progress Notes (Signed)
INITIAL NUTRITION ASSESSMENT  DOCUMENTATION CODES Per approved criteria  -Non-severe (moderate) malnutrition in the context of chronic illness  Pt meets criteria for Moderate MALNUTRITION in the context of Chronic Illness as evidenced by 10.5% weight loss in 6 months, estimated energy intake < 75% of estimated energy needs for > 1 month, and moderate muscle and fat wasting evidenced in physical exam.  INTERVENTION: Provide Ensure Complete BID Encourage PO intake  NUTRITION DIAGNOSIS: Inadequate oral intake related to decreased appetite as evidenced by 10.5% weight loss in 6 months.   Goal: Pt to meet >/= 90% of their estimated nutrition needs   Monitor:  PO intake, weight trend, labs  Reason for Assessment: Malnutrition Screening Tool, score of 3  33 y.o. male  Admitting Dx: Shortness of breath  ASSESSMENT: 33 y.o. male history of lupus, bronchopulmonary dysplasia, history of pericarditis presents to the ER because of worsening shortness of breath chest pain cough subjective feeling of fever over the last 2 days. Patient had gone to the pulmonologist office yesterday and was prescribed Levaquin for bronchitis despite taking which patient was still feeling sick and came to the ER. In the ER patient was found to be tachycardic and was getting easily hypoxic on exertion.  Pt states that prior to 6 months ago he was eating 3 good meals daily and maintaining his weight at 173 lbs. He states 6 months ago his appetite became poor, he started eating about 50% less than he used to, started getting full fast, and lost down to 155 lbs. He reports that some days he gets full off of drinking only 2 cups of coffee and not eating anything.  Encouraged pt to continue eating 3 healthy meals daily with protein-rich foods and snacks in between meals. Encouraged use of nutritional shakes if he continues to lose weight.   Nutrition Focused Physical Exam:  Subcutaneous Fat:  Orbital Region: wnl Upper  Arm Region: moderate wasting Thoracic and Lumbar Region: NA  Muscle:  Temple Region: mild wasting Clavicle Bone Region: moderate wasting Clavicle and Acromion Bone Region: mild wasting Scapular Bone Region: mild wasting Dorsal Hand: moderate wasting Patellar Region: mild wasting Anterior Thigh Region: wnl Posterior Calf Region: wnl  Edema: none noted  Height: Ht Readings from Last 1 Encounters:  01/13/14 5\' 7"  (1.702 m)    Weight: Wt Readings from Last 1 Encounters:  01/14/14 159 lb 13.3 oz (72.5 kg)    Ideal Body Weight: 148 lb  % Ideal Body Weight: 103%  Wt Readings from Last 10 Encounters:  01/14/14 159 lb 13.3 oz (72.5 kg)  01/12/14 153 lb (69.4 kg)  08/19/12 172 lb (78.019 kg)  08/09/12 172 lb 4 oz (78.132 kg)  07/24/12 155 lb 9.6 oz (70.58 kg)  04/06/12 153 lb (69.4 kg)  03/18/12 153 lb (69.4 kg)  01/30/12 157 lb 9.6 oz (71.487 kg)  01/20/12 153 lb 7 oz (69.6 kg)  10/20/11 158 lb (71.668 kg)    Usual Body Weight: 173 lb  % Usual Body Weight: 88%  BMI:  Body mass index is 25.03 kg/(m^2).  Estimated Nutritional Needs: Kcal: 1900-2100 Protein: 90-100 grams Fluid: 1.9-2.1 L/day  Skin: intact  Diet Order: Cardiac  EDUCATION NEEDS: -No education needs identified at this time   Intake/Output Summary (Last 24 hours) at 01/14/14 1155 Last data filed at 01/14/14 0601  Gross per 24 hour  Intake 1427.5 ml  Output    725 ml  Net  702.5 ml    Last BM: 4/7  Labs:   Recent Labs Lab 01/12/14 2242 01/13/14 0848 01/14/14 0420  NA 139 141 142  K 3.7 3.6* 4.1  CL 101 101 107  CO2 23 20 21   BUN 13 16 19   CREATININE 1.42* 1.54* 1.54*  CALCIUM 8.6 8.5 8.6  GLUCOSE 111* 148* 92    CBG (last 3)  No results found for this basename: GLUCAP,  in the last 72 hours  Scheduled Meds: . amLODipine  10 mg Oral Daily  . enoxaparin (LOVENOX) injection  40 mg Subcutaneous Q24H  . feeding supplement (ENSURE COMPLETE)  237 mL Oral TID BM  .  hydroxychloroquine  400 mg Oral Daily  . levalbuterol  0.63 mg Nebulization Q6H  . levofloxacin (LEVAQUIN) IV  750 mg Intravenous Q24H  . pneumococcal 23 valent vaccine  0.5 mL Intramuscular Tomorrow-1000  . [START ON 01/15/2014] predniSONE  50 mg Oral Q breakfast  . sodium chloride  3 mL Intravenous Q12H    Continuous Infusions: . sodium chloride      Past Medical History  Diagnosis Date  . Lupus   . Asthma   . Bronchopulmonary dysplasia   . History of pericarditis   . PAROTITIS, RIGHT 09/30/2009    Qualifier: Diagnosis of  By: Joya Gaskins MD, Burnett Harry   . Hypertension   . GERD (gastroesophageal reflux disease)   . Esophagitis 08/19/2012    EGD  . Stricture esophagus 08/19/2012    EGD  . Hiatal hernia 08/19/2012    EGD    Past Surgical History  Procedure Laterality Date  . Tonsillectomy    . Wisdom tooth extraction      Pryor Ochoa RD, LDN Inpatient Clinical Dietitian Pager: [phone] After Hours Pager: [phone]

## 2014-01-14 NOTE — Progress Notes (Signed)
Notified hospitalist of patient having a more frequent cough since night time has come. Patient also wanted to know if he could have something for cough. Orders given for cough syrup and was administered per PRN order. Will continue to monitor patient to end of shift.

## 2014-01-14 NOTE — Discharge Summary (Signed)
Physician Discharge Summary  Jorge [name] [id number] DOB: 1980/12/23 DOA: 01/12/2014  PCP: Philis Fendt, MD  Admit date: 01/12/2014 Discharge date: 01/14/2014  Time spent: >30 minutes  Recommendations for Outpatient Follow-up:  1. BMET to follow electrolytes and renal function  2. Reassess BP and adjust medications as needed 3. Repeat CT scan chest in 8-12 weeks for follow up on lung abnormalities 4. Assist patient with smoking quitting and make sure he is compliant with pulmonary service follow up appointment  Discharge Diagnoses:  Principal Problem:   Shortness of breath Active Problems:   Extrinsic asthma, unspecified   BRONCHIECTASIS   Lupus   HTN (hypertension)   Malnutrition of moderate degree   Discharge Condition: stable and improved. No wheezing, no SOB and just minimally coughing. Patient will follow with PCP and with pulmonary service as an outpatient.  Diet recommendation: low sodium diet   Filed Weights   01/12/14 2214 01/13/14 0739 01/14/14 0600  Weight: 65.953 kg (145 lb 6.4 oz) 70.444 kg (155 lb 4.8 oz) 72.5 kg (159 lb 13.3 oz)    History of present illness:  33 y.o. male history of lupus, bronchopulmonary dysplasia, history of pericarditis presents to the ER because of worsening shortness of breath chest pain cough subjective feeling of fever over the last 2 days. Patient had gone to the pulmonologist office yesterday and was prescribed Levaquin for bronchitis despite taking which patient was still feeling sick and came to the ER. In the ER patient was found to be tachycardic and was getting easily hypoxic on exertion. Chest x-ray does not show any acute infiltrates. EKG shows sinus tachycardia. Patient's chest pain is pleuritic in nature and patient's shortness of breath chest pain and cough increases on lying down. Patient has been admitted for further workup. Patient denies any nausea vomiting abdominal pain diarrhea headache any focal deficits.     Hospital Course:  1-SOB with acute pleuritic CP: no PE on CT angio. -most likely due to bronchiectasis with exacerbation. -will treat with levaquin, tapering prednisone therapy (with plan for daily 10mg  of prednisone after); PRN albuterol and follow up with Dr. wirhgt pulmonary service as instructed in outpatient setting. -2-D echo w/o pericardial effusion -advise to stop smoking -will need follow up CT scan of his lungs in 8-12 weeks and further decision from pulmonary service at that time regarding FOB  2-renal failure: appears to be secondary to dehydration and continue use of lisinopril -improve with IVF's -advise to stop lisinopril -ask to keep himself well hydrated  3-HTN: started on amlodipine 10mg  daily.  -advise to stop smoking -will follow with PCP in 1- days for further adjustments to antihypertensive regimen -advise to follow low sodium diet  4-tobacco abuse: cessation counseling provided -patient contemplating quitting   5-lupus: continue plaquenil and now prednisone  6-asthma: continue PRN albuterol and follow with pulmonary service as an outpatient  7-moderate protein calorie malnutrition: started on ensure BID.  8-right lung nodule: plan is for FOB as an outpatient. Continue treatment with prednisone and antibiotics  9-chronic cough: most likely due to ACE and smoking -ACE stopped and patient advice to quit smoking    Procedures:  See below for x-ray reports  2-D echo - Left ventricle: The cavity size was normal. There was mild concentric hypertrophy. Systolic function was vigorous. The estimated ejection fraction was in the range of 65% to 70%. Wall motion was normal; there were no regional wall motion abnormalities. Left ventricular diastolic function parameters were normal. - Aortic valve: Trileaflet; normal  thickness leaflets. No regurgitation. - Aortic root: The aortic root was normal in size. - Mitral valve: No regurgitation. - Right  ventricle: Systolic function was normal. - Right atrium: The atrium was normal in size. - Tricuspid valve: Trivial regurgitation. - Pulmonary arteries: Systolic pressure was within the normal range. - Inferior vena cava: The vessel was normal in size. - Pericardium, extracardiac: There was no pericardial effusion.   Consultations:  Pulmonary service  Discharge Exam: Filed Vitals:   01/14/14 1057  BP: 146/106  Pulse:   Temp:   Resp:     General: afebrile, breathing easier and now with good O2 sat on RA Cardiovascular: S1 and S2, no rubs or gallops Respiratory: no wheezing, no crackles, positive scattered rhonchi [id number], NT, ND, positive BS Extremities: no edema or cyanosis   Discharge Instructions You were cared for by a hospitalist during your hospital stay. If you have any questions about your discharge medications or the care you received while you were in the hospital after you are discharged, you can call the unit and asked to speak with the hospitalist on call if the hospitalist that took care of you is not available. Once you are discharged, your primary care physician will handle any further medical issues. Please note that NO REFILLS for any discharge medications will be authorized once you are discharged, as it is imperative that you return to your primary care physician (or establish a relationship with a primary care physician if you do not have one) for your aftercare needs so that they can reassess your need for medications and monitor your lab values.  Discharge Orders   Future Appointments Provider Department Dept Phone   03/04/2014 9:30 AM Elsie Stain, MD [facility] [phone]   Future Orders Complete By Expires   Diet - low sodium heart healthy  As directed    Discharge instructions  As directed        Medication List    STOP taking these medications       lisinopril 20 MG tablet  Commonly known as:  PRINIVIL,ZESTRIL      TAKE  these medications       albuterol 108 (90 BASE) MCG/ACT inhaler  Commonly known as:  PROVENTIL HFA;VENTOLIN HFA  Inhale 2 puffs into the lungs every 4 (four) hours as needed for wheezing or shortness of breath.     amLODipine 10 MG tablet  Commonly known as:  NORVASC  Take 1 tablet (10 mg total) by mouth daily.     CALCIUM 500 PO  Take 750 mg by mouth daily.     feeding supplement (ENSURE COMPLETE) Liqd  Take 237 mLs by mouth 2 (two) times daily between meals.     hydroxychloroquine 200 MG tablet  Commonly known as:  PLAQUENIL  Take 2 tablets (400 mg total) by mouth daily.     levofloxacin 750 MG tablet  Commonly known as:  LEVAQUIN  Take 1 tablet (750 mg total) by mouth daily.     predniSONE 10 MG tablet  Commonly known as:  DELTASONE  Take 5 tablets by mouth daily X 3 days; then 4 tablets by mouth daily X 3 days; then 3 tablets by mouth daily X 3 days; then 2 tablets by mouth daily X 3 days; then start taking 1 tablet by mouth daily.  Start taking on:  01/15/2014       No Known Allergies     Follow-up Information   Follow up with Asencion Noble,  MD On 03/04/2014. (At 9:30AM)    Specialty:  Pulmonary Disease   Contact information:   [address] [place] [address] [phone]       Follow up with Nolene Ebbs A, MD. Schedule an appointment as soon as possible for a visit in 10 days.   Specialty:  Internal Medicine   Contact information:   [place]  [address] [phone]       The results of significant diagnostics from this hospitalization (including imaging, microbiology, ancillary and laboratory) are listed below for reference.    Significant Diagnostic Studies: Dg Chest 2 View  01/12/2014   CLINICAL DATA:  Short of breath. Cough and congestion. History of lupus.  EXAM: CHEST  2 VIEW  COMPARISON:  09/28/2013.  FINDINGS: Cardiac silhouette is normal in size and configuration normal mediastinal contours. Prominent hila suggesting  mild adenopathy. This is stable.  Prominent interstitial markings are also stable. Lungs are otherwise clear. No pleural effusion. No pneumothorax.  The bony thorax is intact.  IMPRESSION: No acute cardiopulmonary disease.   Electronically Signed   By: Lajean Manes M.D.   On: 01/12/2014 15:11   Ct Angio Chest Pe W/cm &/or Wo Cm  01/13/2014   CLINICAL DATA:  Short of breath  EXAM: CT ANGIOGRAPHY CHEST WITH CONTRAST  TECHNIQUE: Multidetector CT imaging of the chest was performed using the standard protocol during bolus administration of intravenous contrast. Multiplanar CT image reconstructions and MIPs were obtained to evaluate the vascular anatomy.  CONTRAST:  74mL OMNIPAQUE IOHEXOL 350 MG/ML SOLN  COMPARISON:  01/12/2014, CTA chest 01/20/2012  FINDINGS: Negative for pulmonary embolism. Negative for aortic dissection or aneurysm. Heart size mildly enlarged. No pericardial effusion.  Mild bilateral hilar and mediastinal adenopathy. Progression of right lower paratracheal and subcarinal adenopathy. Progression of mild hilar adenopathy bilaterally.  The 6 mm right upper lobe nodule is unchanged from the prior study. This is noncalcified. This nodule was also present on 04/09/2009. No other nodules.  Diffuse airway thickening is present as seen on prior studies compatible with chronic bronchitis. No acute pneumonia. Negative for effusion.  Review of the MIP images confirms the above findings.  IMPRESSION: Negative for pulmonary embolism.  6 mm right upper lobe nodule is stable.  Diffuse airway thickening is stable.  Bilateral hilar and mediastinal adenopathy shows mild progression. Differential includes sarcoid, lymphoma, and reactive adenopathy.   Electronically Signed   By: Franchot Gallo M.D.   On: 01/13/2014 10:50   Labs: Basic Metabolic Panel:  Recent Labs Lab 01/12/14 2242 01/13/14 0848 01/14/14 0420  NA 139 141 142  K 3.7 3.6* 4.1  CL 101 101 107  CO2 23 20 21   GLUCOSE 111* 148* 92  BUN 13 16 19    CREATININE 1.42* 1.54* 1.54*  CALCIUM 8.6 8.5 8.6   Liver Function Tests:  Recent Labs Lab 01/13/14 0848  AST 36  ALT 19  ALKPHOS 72  BILITOT 0.2*  PROT 7.8  ALBUMIN 2.8*   CBC:  Recent Labs Lab 01/12/14 2242 01/13/14 0848 01/14/14 0420  WBC 4.9 3.1* 4.5  NEUTROABS  --  2.4  --   HGB 12.6* 11.6* 12.4*  HCT 38.7* 35.8* 37.9*  MCV 78.3 77.7* 78.0  PLT 207 179 175   Cardiac Enzymes:  Recent Labs Lab 01/13/14 0848 01/13/14 1420 01/13/14 2037  CKTOTAL 498*  --   --   TROPONINI <0.30 <0.30 <0.30   BNP: BNP (last 3 results)  Recent Labs  01/12/14 2242  PROBNP  453.6*    Signed:  Barton Dubois  [facility] 01/14/2014, 2:01 PM

## 2014-01-14 NOTE — Progress Notes (Signed)
Pt's PIV infiltrated, pt requesting IV team to re start PIV. IV team notified.

## 2014-01-15 LAB — AFP TUMOR MARKER

## 2014-01-18 ENCOUNTER — Emergency Department (HOSPITAL_COMMUNITY)
Admission: EM | Admit: 2014-01-18 | Discharge: 2014-01-18 | Disposition: A | Discharge status: Home / Self Care | Payer: Medicare Other | Attending: Emergency Medicine | Admitting: Emergency Medicine

## 2014-01-18 ENCOUNTER — Emergency Department (HOSPITAL_COMMUNITY): Payer: Medicare Other

## 2014-01-18 ENCOUNTER — Encounter (HOSPITAL_COMMUNITY): Payer: Self-pay | Admitting: Emergency Medicine

## 2014-01-18 DIAGNOSIS — J45909 Unspecified asthma, uncomplicated: Secondary | ICD-10-CM | POA: Insufficient documentation

## 2014-01-18 DIAGNOSIS — Z79899 Other long term (current) drug therapy: Secondary | ICD-10-CM | POA: Diagnosis not present

## 2014-01-18 DIAGNOSIS — M329 Systemic lupus erythematosus, unspecified: Secondary | ICD-10-CM | POA: Diagnosis not present

## 2014-01-18 DIAGNOSIS — R0602 Shortness of breath: Secondary | ICD-10-CM | POA: Diagnosis not present

## 2014-01-18 DIAGNOSIS — R6510 Systemic inflammatory response syndrome (SIRS) of non-infectious origin without acute organ dysfunction: Secondary | ICD-10-CM | POA: Diagnosis not present

## 2014-01-18 DIAGNOSIS — A419 Sepsis, unspecified organism: Secondary | ICD-10-CM | POA: Diagnosis not present

## 2014-01-18 DIAGNOSIS — Z8719 Personal history of other diseases of the digestive system: Secondary | ICD-10-CM | POA: Insufficient documentation

## 2014-01-18 DIAGNOSIS — R079 Chest pain, unspecified: Secondary | ICD-10-CM | POA: Diagnosis not present

## 2014-01-18 DIAGNOSIS — I10 Essential (primary) hypertension: Secondary | ICD-10-CM | POA: Diagnosis not present

## 2014-01-18 DIAGNOSIS — R0600 Dyspnea, unspecified: Secondary | ICD-10-CM

## 2014-01-18 LAB — BASIC METABOLIC PANEL
BUN: 27 mg/dL — ABNORMAL HIGH (ref 6–23)
CO2: 20 mEq/L (ref 19–32)
Calcium: 9.4 mg/dL (ref 8.4–10.5)
Chloride: 102 mEq/L (ref 96–112)
Creatinine, Ser: 1.38 mg/dL — ABNORMAL HIGH (ref 0.50–1.35)
GFR calc non Af Amer: 67 mL/min — ABNORMAL LOW (ref >90)
GFR, EST AFRICAN AMERICAN: 78 mL/min — AB (ref >90)
Glucose, Bld: 125 mg/dL — ABNORMAL HIGH (ref 70–99)
POTASSIUM: 4.7 meq/L (ref 3.7–5.3)
SODIUM: 139 meq/L (ref 137–147)

## 2014-01-18 LAB — CBC
HCT: 42.5 % (ref 39.0–52.0)
HEMOGLOBIN: 14.2 g/dL (ref 13.0–17.0)
MCH: 25.4 pg — ABNORMAL LOW (ref 26.0–34.0)
MCHC: 33.4 g/dL (ref 30.0–36.0)
MCV: 76.2 fL — ABNORMAL LOW (ref 78.0–100.0)
PLATELETS: 316 10*3/uL (ref 150–400)
RBC: 5.58 MIL/uL (ref 4.22–5.81)
RDW: 14.3 % (ref 11.5–15.5)
WBC: 8.3 10*3/uL (ref 4.0–10.5)

## 2014-01-18 LAB — I-STAT TROPONIN, ED: TROPONIN I, POC: 0 ng/mL (ref 0.00–0.08)

## 2014-01-18 MED ORDER — ALBUTEROL SULFATE (2.5 MG/3ML) 0.083% IN NEBU
5.0000 mg | INHALATION_SOLUTION | Freq: Once | RESPIRATORY_TRACT | Status: AC
  Administered 2014-01-18: 5 mg via RESPIRATORY_TRACT
  Filled 2014-01-18: qty 6

## 2014-01-18 MED ORDER — METHYLPREDNISOLONE SODIUM SUCC 125 MG IJ SOLR
125.0000 mg | Freq: Once | INTRAMUSCULAR | Status: AC
  Administered 2014-01-18: 125 mg via INTRAVENOUS
  Filled 2014-01-18: qty 2

## 2014-01-18 NOTE — Discharge Instructions (Signed)
Continue using your albuterol inhaler. 2 puffs every 4 hours as needed.  Continue prednisone as prescribed.  All up with your rheumatologist and return to the ER if you develop significant worsening of your symptoms

## 2014-01-18 NOTE — ED Notes (Signed)
Pt c/o chest pain with shortness of breath onset about 15 mins PTA. Pt with labored breathing at triage. Pt was recently admitted here for lupus flare up and rapid heart beat and was told if he got worse to return.

## 2014-01-18 NOTE — ED Provider Notes (Signed)
CSN: [id number]     Arrival date & time 01/18/14  1547 History   First MD Initiated Contact with Patient 01/18/14 1605     Chief Complaint  Patient presents with  . Chest Pain  . Shortness of Breath     (Consider location/radiation/quality/duration/timing/severity/associated sxs/prior Treatment) HPI Comments: Patient is a 33 year old male with history of lupus diagnosed at the age of 53. He also was diagnosed with bronchopulmonary dysplasia as a child. He presents today with complaints of chest discomfort and difficulty breathing that started shortly prior to arrival. He was just discharged 4 days ago after being admitted for similar complaints.  He was treated with oxygen and steroids. He denies any fevers, chills, productive cough.  Patient is a 33 y.o. male presenting with shortness of breath. The history is provided by the patient.  Shortness of Breath Severity:  Moderate Onset quality:  Sudden Duration:  1 day Timing:  Constant Progression:  Worsening Chronicity:  New Context: activity   Relieved by:  Nothing Worsened by:  Activity Ineffective treatments:  None tried   Past Medical History  Diagnosis Date  . Lupus   . Asthma   . Bronchopulmonary dysplasia   . History of pericarditis   . PAROTITIS, RIGHT 09/30/2009    Qualifier: Diagnosis of  By: Joya Gaskins MD, Burnett Harry   . Hypertension   . GERD (gastroesophageal reflux disease)   . Esophagitis 08/19/2012    EGD  . Stricture esophagus 08/19/2012    EGD  . Hiatal hernia 08/19/2012    EGD   Past Surgical History  Procedure Laterality Date  . Tonsillectomy    . Wisdom tooth extraction     Family History  Problem Relation Age of Onset  . Lupus Father   . HIV Brother   . Thyroid cancer Maternal Aunt    History  Substance Use Topics  . Smoking status: Never Smoker   . Smokeless tobacco: Never Used  . Alcohol Use: Yes     Comment: Occasional wine    Review of Systems  Respiratory: Positive for shortness of  breath.   All other systems reviewed and are negative.     Allergies  Review of patient's allergies indicates no known allergies.  Home Medications   Current Outpatient Rx  Name  Route  Sig  Dispense  Refill  . albuterol (PROVENTIL HFA;VENTOLIN HFA) 108 (90 BASE) MCG/ACT inhaler   Inhalation   Inhale 2 puffs into the lungs every 4 (four) hours as needed for wheezing or shortness of breath.   1 Inhaler   3   . amLODipine (NORVASC) 10 MG tablet   Oral   Take 1 tablet (10 mg total) by mouth daily.   30 tablet   3   . Calcium Carbonate (CALCIUM 500 PO)   Oral   Take 750 mg by mouth daily.         . hydroxychloroquine (PLAQUENIL) 200 MG tablet   Oral   Take 2 tablets (400 mg total) by mouth daily.   60 tablet   2   . levofloxacin (LEVAQUIN) 750 MG tablet   Oral   Take 1 tablet (750 mg total) by mouth daily.   7 tablet   0   . predniSONE (DELTASONE) 10 MG tablet      Take 5 tablets by mouth daily X 3 days; then 4 tablets by mouth daily X 3 days; then 3 tablets by mouth daily X 3 days; then 2 tablets by mouth daily X  3 days; then start taking 1 tablet by mouth daily.   60 tablet   1    BP 154/104  Pulse 96  Temp(Src) 97.9 F (36.6 C) (Oral)  Resp 20  Ht 5\' 7"  (1.702 m)  Wt 159 lb (72.122 kg)  BMI 24.90 kg/m2  SpO2 96% Physical Exam  Nursing note and vitals reviewed. Constitutional: He is oriented to person, place, and time. He appears well-developed and well-nourished. No distress.  HENT:  Head: Normocephalic and atraumatic.  Mouth/Throat: Oropharynx is clear and moist.  Neck: Normal range of motion. Neck supple.  Cardiovascular: Normal rate, regular rhythm and normal heart sounds.   No murmur heard. Pulmonary/Chest: Effort normal. No respiratory distress. He has wheezes.  There are slight rhonchorous sounds bilaterally.  Abdominal: Soft. Bowel sounds are normal. He exhibits no distension. There is no tenderness.  Musculoskeletal: Normal range of  motion. He exhibits no edema.  Neurological: He is alert and oriented to person, place, and time.  Skin: Skin is warm and dry. He is not diaphoretic.    ED Course  Procedures (including critical care time) Labs Review Labs Reviewed  [place], ED   Imaging Review No results found.   EKG Interpretation   Date/Time:  Sunday January 18 2014 15:52:45 EDT Ventricular Rate:  109 PR Interval:  130 QRS Duration: 78 QT Interval:  342 QTC Calculation: 460 R Axis:   62 Text Interpretation:  Sinus tachycardia Right atrial enlargement  Nonspecific T wave abnormality Abnormal ECG No significant change since  01/13/14 Confirmed by DELOS  MD, Daveigh Batty ([id number]) on 01/18/2014 4:34:01 PM      MDM   Final diagnoses:  None    Patient is a 33 year old male with history of lupus recently hospitalized for dyspnea. He had extensive workup which ruled out pulmonary embolism and other significant pathology. He was discharged with steroids and an inhaler which were helping up until today when he developed another episode. He presented here with oxygen saturations in the mid 90s. Laboratory studies were obtained which were again unremarkable. Chest x-ray reveals no interval change. He was given a breathing treatment here along with Solu-Medrol and appears to be feeling better. He was ambulated and saturations remained between 95 and 97%. At this point I do not feel as though there is indication for admission. He will be discharged to home with followup with his primary Dr. and rheumatologist. He is to return if his symptoms recur or substantially worsen.    Veryl Speak, MD 01/18/14 [phone]

## 2014-01-30 ENCOUNTER — Ambulatory Visit: Payer: Medicare Other | Admitting: Critical Care Medicine

## 2014-02-04 ENCOUNTER — Ambulatory Visit: Payer: Medicare Other | Admitting: Critical Care Medicine

## 2014-03-04 ENCOUNTER — Ambulatory Visit (INDEPENDENT_AMBULATORY_CARE_PROVIDER_SITE_OTHER): Payer: Medicare Other | Admitting: Critical Care Medicine

## 2014-03-04 ENCOUNTER — Encounter (INDEPENDENT_AMBULATORY_CARE_PROVIDER_SITE_OTHER): Payer: Self-pay

## 2014-03-04 ENCOUNTER — Encounter: Payer: Self-pay | Admitting: Critical Care Medicine

## 2014-03-04 VITALS — BP 114/80 | HR 85 | Temp 98.5°F | Ht 67.0 in | Wt 159.0 lb

## 2014-03-04 DIAGNOSIS — J479 Bronchiectasis, uncomplicated: Secondary | ICD-10-CM

## 2014-03-04 DIAGNOSIS — J9610 Chronic respiratory failure, unspecified whether with hypoxia or hypercapnia: Secondary | ICD-10-CM

## 2014-03-04 DIAGNOSIS — J9611 Chronic respiratory failure with hypoxia: Secondary | ICD-10-CM

## 2014-03-04 DIAGNOSIS — R0902 Hypoxemia: Secondary | ICD-10-CM

## 2014-03-04 DIAGNOSIS — R599 Enlarged lymph nodes, unspecified: Secondary | ICD-10-CM

## 2014-03-04 DIAGNOSIS — R590 Localized enlarged lymph nodes: Secondary | ICD-10-CM | POA: Insufficient documentation

## 2014-03-04 MED ORDER — OMEPRAZOLE 20 MG PO CPDR: 20.0000 mg | DELAYED_RELEASE_CAPSULE | Freq: Every day | ORAL | Status: DC

## 2014-03-04 MED ORDER — ARFORMOTEROL TARTRATE 15 MCG/2ML IN NEBU
15.0000 ug | INHALATION_SOLUTION | Freq: Two times a day (BID) | RESPIRATORY_TRACT | Status: DC
Start: 2014-03-04 — End: 2014-03-16

## 2014-03-04 MED ORDER — COMPRESSOR/NEBULIZER MISC: Status: DC

## 2014-03-04 NOTE — Assessment & Plan Note (Signed)
Bronchopulmonary dysplasia and bronchiectasis with recent flare No current exerional hypoxemia Plan Home nebulizer brovana twice daily will be set up Refill amlodipine/omeprazole/plaquenil Albuterol as needed Overnight oxygen test will be obtained Can use 1 can of ensure per day.  We will see if [facility] can provide this to you. Please establish with a Primary Care Doctor with the [facility] For sleep:  You can use benadryl 25 mg at bedtime as needed OR melatonin 3-6 mg 3-4 hours prior to bedtime Return 2 months

## 2014-03-04 NOTE — Patient Instructions (Addendum)
Home nebulizer brovana twice daily will be set up Refill amlodipine/omeprazole/plaquenil Albuterol as needed Overnight oxygen test will be obtained Can use 1 can of ensure per day.  We will see if [facility] can provide this to you. Please establish with a Primary Care Doctor with the [facility] For sleep:  You can use benadryl 25 mg at bedtime as needed OR melatonin 3-6 mg 3-4 hours prior to bedtime Return 2 months

## 2014-03-04 NOTE — Progress Notes (Signed)
Subjective:    Patient ID: Jorge [name], male    DOB: May 28, 1981, 33 y.o.   MRN: [id number]  HPI  03/04/2014 Chief Complaint  Patient presents with  . HFU    Breathing has improved.  Has chest tightness at times, a little wheezing, and nonprod cough. No SOB.  Has trouble falling asleep and staying asleep x 4 months.  Pt still dyspneic and coughing. Is better, vs before.  Pt has GERD symptoms.  Pt in hosp two days in 01/2014. Pt is now better. Less cough.  Eating is better Finished abx and pred. Pt denies any significant sore throat, nasal congestion or excess secretions, fever, chills, sweats, unintended weight loss, pleurtic or exertional chest pain, orthopnea PND, or leg swelling Pt denies any increase in rescue therapy over baseline, denies waking up needing it or having any early am or nocturnal exacerbations of coughing/wheezing/or dyspnea. Pt also denies any obvious fluctuation in symptoms with  weather or environmental change or other alleviating or aggravating factors      Review of Systems Constitutional:   No  weight loss, night sweats,  Fevers, chills, fatigue, lassitude. HEENT:   No headaches,  Difficulty swallowing,  Tooth/dental problems,  Sore throat,                No sneezing, itching, ear ache, nasal congestion, post nasal drip,   CV:  No chest pain,  Orthopnea, PND, swelling in lower extremities, anasarca, dizziness, palpitations  GI  No heartburn, indigestion, abdominal pain, nausea, vomiting, diarrhea, change in bowel habits, loss of appetite  Resp: No shortness of breath with exertion or at rest.  No excess mucus, no productive cough,  No non-productive cough,  No coughing up of blood.  No change in color of mucus.  No wheezing.  No chest wall deformity  Skin: no rash or lesions.  GU: no dysuria, change in color of urine, no urgency or frequency.  No flank pain.  MS:  No joint pain or swelling.  No decreased range of motion.  No back pain.  Psych:  No  change in mood or affect. No depression or anxiety.  No memory loss.     Objective:   Physical Exam Filed Vitals:   03/04/14 0946  BP: 114/80  Pulse: 85  Temp: 98.5 F (36.9 C)  TempSrc: Oral  Height: 5\' 7"  (1.702 m)  Weight: 159 lb (72.122 kg)  SpO2: 99%    Gen: Pleasant, well-nourished, in no distress,  normal affect  ENT: No lesions,  mouth clear,  oropharynx clear, no postnasal drip  Neck: No JVD, no TMG, no carotid bruits  Lungs: No use of accessory muscles, no dullness to percussion, distant BS Cardiovascular: RRR, heart sounds normal, no murmur or gallops, no peripheral edema  Abdomen: soft and NT, no HSM,  BS normal  Musculoskeletal: No deformities, no cyanosis +++clubbing  Neuro: alert, non focal  Skin: Warm, no lesions or rashes  No results found.        Assessment & Plan:   BRONCHIECTASIS Bronchopulmonary dysplasia and bronchiectasis with recent flare No current exerional hypoxemia Plan Home nebulizer brovana twice daily will be set up Refill amlodipine/omeprazole/plaquenil Albuterol as needed Overnight oxygen test will be obtained Can use 1 can of ensure per day.  We will see if [facility] can provide this to you. Please establish with a Primary Care Doctor with the [facility] For sleep:  You can use benadryl 25 mg at bedtime as needed OR melatonin  3-6 mg 3-4 hours prior to bedtime Return 2 months     Updated Medication List Outpatient Encounter Prescriptions as of 03/04/2014  Medication Sig  . albuterol (PROVENTIL HFA;VENTOLIN HFA) 108 (90 BASE) MCG/ACT inhaler Inhale 2 puffs into the lungs every 4 (four) hours as needed for wheezing or shortness of breath.  Marland Kitchen amLODipine (NORVASC) 10 MG tablet Take 1 tablet (10 mg total) by mouth daily.  Marland Kitchen arformoterol (BROVANA) 15 MCG/2ML NEBU Take 2 mLs (15 mcg total) by nebulization 2 (two) times daily.  . hydroxychloroquine (PLAQUENIL) 200 MG tablet Take 2 tablets (400 mg total) by mouth daily.    . Nebulizers (COMPRESSOR/NEBULIZER) MISC Use with brovana  . omeprazole (PRILOSEC) 20 MG capsule Take 1 capsule (20 mg total) by mouth daily.  . [DISCONTINUED] Calcium Carbonate (CALCIUM 500 PO) Take 750 mg by mouth daily.  . [DISCONTINUED] levofloxacin (LEVAQUIN) 750 MG tablet Take 1 tablet (750 mg total) by mouth daily.  . [DISCONTINUED] predniSONE (DELTASONE) 10 MG tablet Take 10-50 mg by mouth daily with breakfast. Take 5 tablets for 3 days,4 tabs for 3 days,3 tabs for 3 days,2 tabs for 3 days and then 1 tab daily

## 2014-03-09 ENCOUNTER — Telehealth: Payer: Self-pay | Admitting: Critical Care Medicine

## 2014-03-09 NOTE — Telephone Encounter (Signed)
LMTCB

## 2014-03-11 NOTE — Telephone Encounter (Addendum)
Spoke with pt -- Brovana needing PA Pt states that form was faxed Tuesday 03/10/14 for Dr Joya Gaskins to fill out.  Form has been given to Crystal to have Dr Joya Gaskins complete/sign.  Will need to be faxed back to [facility] when complete.

## 2014-03-13 NOTE — Telephone Encounter (Signed)
Brovana PA completed.  Pw signed it.   I have faxed it to [facility] at [phone]. Will await approval/denial

## 2014-03-16 ENCOUNTER — Telehealth: Payer: Self-pay | Admitting: Critical Care Medicine

## 2014-03-16 MED ORDER — ARFORMOTEROL TARTRATE 15 MCG/2ML IN NEBU: 15.0000 ug | INHALATION_SOLUTION | Freq: Two times a day (BID) | RESPIRATORY_TRACT | Status: DC

## 2014-03-16 NOTE — Telephone Encounter (Signed)
I called made pt aware we are awaiting approval/denial.  Called [employer] at [phone]. Spoke with Kennyth Lose.  Was advised on 03/14/14 this was denied under [employer] services.  Pt does not have part B with them. This would be covered under part B. Was advised no appeal can be done under part D. Advised will need contact pt to see if he has part B under another carrier. Called pt and he does have medicare/medicaid. He is not sure if has part B under medicare but thinks he does not. His card does not mention anything about part B just aprt D. Any alternatives Dr. Joya Gaskins? Thanks  No Known Allergies

## 2014-03-16 NOTE — Telephone Encounter (Signed)
Only alternative is to use albuterol in nebulizer 2-4 times daily

## 2014-03-16 NOTE — Telephone Encounter (Signed)
I called pt. He reports he called his insurance to see for sure if he has part B or not. He was advised he did have part B and they gave him the information regarding his part B. He will take this to [facility] so they can run brovana through insurance part B. I have re-sent RX to [facility] w/ DX code on it. I called and made [facility] aware pt will be rbinging part B information in. Nothing further needed

## 2014-03-16 NOTE — Telephone Encounter (Signed)
Duplicate message see phone note 03/09/14

## 2014-04-14 ENCOUNTER — Ambulatory Visit (INDEPENDENT_AMBULATORY_CARE_PROVIDER_SITE_OTHER)
Admission: RE | Admit: 2014-04-14 | Discharge: 2014-04-14 | Disposition: A | Discharge status: Home / Self Care | Payer: Medicare Other | Source: Ambulatory Visit | Attending: Pulmonary Disease | Admitting: Pulmonary Disease

## 2014-04-14 DIAGNOSIS — R599 Enlarged lymph nodes, unspecified: Secondary | ICD-10-CM | POA: Diagnosis not present

## 2014-04-14 DIAGNOSIS — R591 Generalized enlarged lymph nodes: Secondary | ICD-10-CM

## 2014-04-14 DIAGNOSIS — R911 Solitary pulmonary nodule: Secondary | ICD-10-CM | POA: Diagnosis not present

## 2014-04-14 DIAGNOSIS — J45909 Unspecified asthma, uncomplicated: Secondary | ICD-10-CM | POA: Diagnosis not present

## 2014-04-29 ENCOUNTER — Ambulatory Visit (INDEPENDENT_AMBULATORY_CARE_PROVIDER_SITE_OTHER): Payer: Medicare Other | Admitting: Critical Care Medicine

## 2014-04-29 ENCOUNTER — Encounter: Payer: Self-pay | Admitting: Critical Care Medicine

## 2014-04-29 VITALS — BP 106/80 | HR 98 | Temp 97.8°F | Ht 68.0 in | Wt 161.4 lb

## 2014-04-29 DIAGNOSIS — IMO0001 Reserved for inherently not codable concepts without codable children: Secondary | ICD-10-CM

## 2014-04-29 DIAGNOSIS — K2100 Gastro-esophageal reflux disease with esophagitis, without bleeding: Secondary | ICD-10-CM

## 2014-04-29 DIAGNOSIS — K219 Gastro-esophageal reflux disease without esophagitis: Secondary | ICD-10-CM | POA: Diagnosis not present

## 2014-04-29 DIAGNOSIS — J471 Bronchiectasis with (acute) exacerbation: Secondary | ICD-10-CM | POA: Diagnosis not present

## 2014-04-29 DIAGNOSIS — R911 Solitary pulmonary nodule: Secondary | ICD-10-CM | POA: Diagnosis not present

## 2014-04-29 MED ORDER — OMEPRAZOLE 20 MG PO CPDR: 20.0000 mg | DELAYED_RELEASE_CAPSULE | Freq: Two times a day (BID) | ORAL | Status: DC

## 2014-04-29 MED ORDER — ALBUTEROL SULFATE (2.5 MG/3ML) 0.083% IN NEBU: 2.5000 mg | INHALATION_SOLUTION | Freq: Three times a day (TID) | RESPIRATORY_TRACT | Status: DC

## 2014-04-29 MED ORDER — FAMOTIDINE 40 MG PO TABS: 40.0000 mg | ORAL_TABLET | Freq: Every evening | ORAL | Status: DC

## 2014-04-29 NOTE — Patient Instructions (Signed)
Increase omeprazole to one twice daily before meals Start pepcid 40mg  at bedtime daily Reflux diet strict Referral to Dr Collene Mares or [facility] Stop brovana when current supply runs out, then start albuterol in nebulizer 3 times daily We will repeat CT Chest in 6 months Return 4  months

## 2014-04-29 NOTE — Progress Notes (Signed)
Subjective:    Patient ID: Jorge [name], male    DOB: 30-May-1981, 33 y.o.   MRN: [id number]  HPI  04/29/2014 Chief Complaint  Patient presents with  . Follow-up    Has DOE some days.  Heart burn x 1 wk.  No cough.    Cough is fair, comes and goes, occ at night, pt remains hoarse, notes more heartburn past week, awakens pt from sleep. No GI MD.  Never had an EGD. No PCP.   Notes some chest pain as well.    Review of Systems  Constitutional:   No  weight loss, night sweats,  Fevers, chills, fatigue, lassitude. HEENT:   No headaches,  Difficulty swallowing,  Tooth/dental problems,  Sore throat,                No sneezing, itching, ear ache, nasal congestion, post nasal drip,   CV:  No chest pain,  Orthopnea, PND, swelling in lower extremities, anasarca, dizziness, palpitations  GI  No heartburn, indigestion, abdominal pain, nausea, vomiting, diarrhea, change in bowel habits, loss of appetite  Resp: No shortness of breath with exertion or at rest.  No excess mucus, no productive cough,  No non-productive cough,  No coughing up of blood.  No change in color of mucus.  No wheezing.  No chest wall deformity  Skin: no rash or lesions.  GU: no dysuria, change in color of urine, no urgency or frequency.  No flank pain.  MS:  No joint pain or swelling.  No decreased range of motion.  No back pain.  Psych:  No change in mood or affect. No depression or anxiety.  No memory loss.     Objective:   Physical Exam  Filed Vitals:   04/29/14 1002  BP: 106/80  Pulse: 98  Temp: 97.8 F (36.6 C)  TempSrc: Oral  Height: 5\' 8"  (1.727 m)  Weight: 161 lb 6.4 oz (73.211 kg)  SpO2: 98%    Gen: Pleasant, well-nourished, in no distress,  normal affect  ENT: No lesions,  mouth clear,  oropharynx clear, no postnasal drip  Neck: No JVD, no TMG, no carotid bruits  Lungs: No use of accessory muscles, no dullness to percussion, distant BS  Cardiovascular: RRR, heart sounds normal, no murmur  or gallops, no peripheral edema  Abdomen: soft , tender in epigastric area, no HSM,  BS normal  Musculoskeletal: No deformities, no cyanosis +++clubbing  Neuro: alert, non focal  Skin: Warm, no lesions or rashes  No results found. Recent CT of the chest was reviewed and shows stable right lower lobe lung nodule    Assessment & Plan:   BRONCHIECTASIS Bronchiectasis flare exacerbated by high level reflux disease with microaspiration Plan Increase omeprazole to one twice daily before meals Start pepcid 40mg  at bedtime daily Reflux diet strict Referral to Dr Collene Mares or [facility] Stop brovana when current supply runs out, then start albuterol in nebulizer 3 times daily Return 4  months   Pulmonary nodule, right 6 mm right lower lobe lung nodule likely benign but will warrant followup in 6 months  GERD (gastroesophageal reflux disease) High level reflux disease likely with associated esophagitis exacerbating lung disease Referral to GI Increase proton pump inhibitor dosage Reflux diet Add nocturnal H2 blocker    Updated Medication List Outpatient Encounter Prescriptions as of 04/29/2014  Medication Sig  . albuterol (PROVENTIL HFA;VENTOLIN HFA) 108 (90 BASE) MCG/ACT inhaler Inhale 2 puffs into the lungs every 4 (four) hours as  needed for wheezing or shortness of breath.  Marland Kitchen amLODipine (NORVASC) 10 MG tablet Take 1 tablet (10 mg total) by mouth daily.  . hydroxychloroquine (PLAQUENIL) 200 MG tablet Take 2 tablets (400 mg total) by mouth daily.  . Nebulizers (COMPRESSOR/NEBULIZER) MISC Use with brovana  . omeprazole (PRILOSEC) 20 MG capsule Take 1 capsule (20 mg total) by mouth 2 (two) times daily before a meal.  . [DISCONTINUED] arformoterol (BROVANA) 15 MCG/2ML NEBU Take 2 mLs (15 mcg total) by nebulization 2 (two) times daily. DX 494. Run part B  . [DISCONTINUED] omeprazole (PRILOSEC) 20 MG capsule Take 1 capsule (20 mg total) by mouth daily.  Marland Kitchen albuterol (PROVENTIL) (2.5  MG/3ML) 0.083% nebulizer solution Take 3 mLs (2.5 mg total) by nebulization 3 (three) times daily.  . famotidine (PEPCID) 40 MG tablet Take 1 tablet (40 mg total) by mouth every evening.

## 2014-04-30 DIAGNOSIS — K219 Gastro-esophageal reflux disease without esophagitis: Secondary | ICD-10-CM | POA: Insufficient documentation

## 2014-04-30 NOTE — Assessment & Plan Note (Signed)
6 mm right lower lobe lung nodule likely benign but will warrant followup in 6 months

## 2014-04-30 NOTE — Assessment & Plan Note (Signed)
Bronchiectasis flare exacerbated by high level reflux disease with microaspiration Plan Increase omeprazole to one twice daily before meals Start pepcid 40mg  at bedtime daily Reflux diet strict Referral to Dr Collene Mares or [facility] Stop brovana when current supply runs out, then start albuterol in nebulizer 3 times daily Return 4  months

## 2014-04-30 NOTE — Assessment & Plan Note (Signed)
High level reflux disease likely with associated esophagitis exacerbating lung disease Referral to GI Increase proton pump inhibitor dosage Reflux diet Add nocturnal H2 blocker

## 2014-05-01 ENCOUNTER — Ambulatory Visit: Payer: Medicare Other | Admitting: Critical Care Medicine

## 2014-05-20 ENCOUNTER — Other Ambulatory Visit: Payer: Self-pay | Admitting: Gastroenterology

## 2014-05-20 DIAGNOSIS — R933 Abnormal findings on diagnostic imaging of other parts of digestive tract: Secondary | ICD-10-CM | POA: Diagnosis not present

## 2014-05-20 DIAGNOSIS — R1310 Dysphagia, unspecified: Secondary | ICD-10-CM | POA: Diagnosis not present

## 2014-05-20 DIAGNOSIS — K219 Gastro-esophageal reflux disease without esophagitis: Secondary | ICD-10-CM | POA: Diagnosis not present

## 2014-05-20 DIAGNOSIS — M329 Systemic lupus erythematosus, unspecified: Secondary | ICD-10-CM | POA: Diagnosis not present

## 2014-05-21 ENCOUNTER — Encounter (HOSPITAL_COMMUNITY): Payer: Self-pay | Admitting: Pharmacy Technician

## 2014-05-22 ENCOUNTER — Encounter (HOSPITAL_COMMUNITY): Payer: Self-pay | Admitting: *Deleted

## 2014-05-29 ENCOUNTER — Encounter (HOSPITAL_COMMUNITY): Payer: Medicare Other | Admitting: Anesthesiology

## 2014-05-29 ENCOUNTER — Encounter (HOSPITAL_COMMUNITY)
Admission: RE | Disposition: A | Discharge status: Home / Self Care | Payer: Self-pay | Source: Ambulatory Visit | Attending: Gastroenterology

## 2014-05-29 ENCOUNTER — Ambulatory Visit (HOSPITAL_COMMUNITY): Payer: Medicare Other | Admitting: Anesthesiology

## 2014-05-29 ENCOUNTER — Encounter (HOSPITAL_COMMUNITY): Payer: Self-pay | Admitting: *Deleted

## 2014-05-29 ENCOUNTER — Ambulatory Visit (HOSPITAL_COMMUNITY)
Admission: RE | Admit: 2014-05-29 | Discharge: 2014-05-29 | Disposition: A | Discharge status: Home / Self Care | Payer: Medicare Other | Source: Ambulatory Visit | Attending: Gastroenterology | Admitting: Gastroenterology

## 2014-05-29 DIAGNOSIS — K222 Esophageal obstruction: Secondary | ICD-10-CM | POA: Insufficient documentation

## 2014-05-29 DIAGNOSIS — Z79899 Other long term (current) drug therapy: Secondary | ICD-10-CM | POA: Insufficient documentation

## 2014-05-29 DIAGNOSIS — K219 Gastro-esophageal reflux disease without esophagitis: Secondary | ICD-10-CM | POA: Insufficient documentation

## 2014-05-29 DIAGNOSIS — K208 Other esophagitis without bleeding: Secondary | ICD-10-CM | POA: Diagnosis not present

## 2014-05-29 DIAGNOSIS — R1310 Dysphagia, unspecified: Secondary | ICD-10-CM | POA: Diagnosis present

## 2014-05-29 DIAGNOSIS — K449 Diaphragmatic hernia without obstruction or gangrene: Secondary | ICD-10-CM | POA: Insufficient documentation

## 2014-05-29 DIAGNOSIS — K209 Esophagitis, unspecified without bleeding: Secondary | ICD-10-CM | POA: Diagnosis not present

## 2014-05-29 HISTORY — PX: ESOPHAGOGASTRODUODENOSCOPY (EGD) WITH PROPOFOL: SHX5813

## 2014-05-29 SURGERY — ESOPHAGOGASTRODUODENOSCOPY (EGD) WITH PROPOFOL
Anesthesia: Monitor Anesthesia Care

## 2014-05-29 MED ORDER — PROPOFOL 10 MG/ML IV BOLUS
INTRAVENOUS | Status: AC
  Filled 2014-05-29: qty 20

## 2014-05-29 MED ORDER — LACTATED RINGERS IV SOLN
INTRAVENOUS | Status: DC
  Administered 2014-05-29: 1000 mL via INTRAVENOUS

## 2014-05-29 MED ORDER — LACTATED RINGERS IV SOLN
INTRAVENOUS | Status: DC | PRN
  Administered 2014-05-29: 11:00:00 via INTRAVENOUS

## 2014-05-29 MED ORDER — FENTANYL CITRATE 0.05 MG/ML IJ SOLN
25.0000 ug | Freq: Once | INTRAMUSCULAR | Status: AC
  Administered 2014-05-29: 25 ug via INTRAVENOUS

## 2014-05-29 MED ORDER — FENTANYL CITRATE 0.05 MG/ML IJ SOLN
INTRAMUSCULAR | Status: AC
  Filled 2014-05-29: qty 2

## 2014-05-29 MED ORDER — PROPOFOL 10 MG/ML IV BOLUS
INTRAVENOUS | Status: AC
  Filled 2014-05-29: qty 40

## 2014-05-29 MED ORDER — ALBUTEROL SULFATE (2.5 MG/3ML) 0.083% IN NEBU
INHALATION_SOLUTION | RESPIRATORY_TRACT | Status: AC
  Filled 2014-05-29: qty 3

## 2014-05-29 MED ORDER — MEPERIDINE HCL 100 MG/ML IJ SOLN: 6.2500 mg | INTRAMUSCULAR | Status: DC | PRN

## 2014-05-29 MED ORDER — SODIUM CHLORIDE 0.9 % IV SOLN: INTRAVENOUS | Status: DC

## 2014-05-29 MED ORDER — PROPOFOL 10 MG/ML IV BOLUS
INTRAVENOUS | Status: DC | PRN
  Administered 2014-05-29: 100 mg via INTRAVENOUS
  Administered 2014-05-29 (×2): 50 mg via INTRAVENOUS

## 2014-05-29 MED ORDER — PROMETHAZINE HCL 25 MG/ML IJ SOLN: 6.2500 mg | INTRAMUSCULAR | Status: DC | PRN

## 2014-05-29 MED ORDER — ALBUTEROL SULFATE (2.5 MG/3ML) 0.083% IN NEBU
2.5000 mg | INHALATION_SOLUTION | Freq: Once | RESPIRATORY_TRACT | Status: AC
  Administered 2014-05-29: 2.5 mg via RESPIRATORY_TRACT

## 2014-05-29 SURGICAL SUPPLY — 14 items

## 2014-05-29 NOTE — Discharge Instructions (Signed)
Esophagogastroduodenoscopy °Care After °Refer to this sheet in the next few weeks. These instructions provide you with information on caring for yourself after your procedure. Your caregiver may also give you more specific instructions. Your treatment has been planned according to current medical practices, but problems sometimes occur. Call your caregiver if you have any problems or questions after your procedure.  °HOME CARE INSTRUCTIONS °· Do not eat or drink anything until the numbing medicine (local anesthetic) has worn off and your gag reflex has returned. You will know that the local anesthetic has worn off when you can swallow comfortably. °· Do not drive for 12 hours after the procedure or as directed by your caregiver. °· Only take medicines as directed by your caregiver. °SEEK MEDICAL CARE IF:  °· You cannot stop coughing. °· You are not urinating at all or less than usual. °SEEK IMMEDIATE MEDICAL CARE IF: °· You have difficulty swallowing. °· You cannot eat or drink. °· You have worsening throat or chest pain. °· You have dizziness, lightheadedness, or you faint. °· You have nausea or vomiting. °· You have chills. °· You have a fever. °· You have severe abdominal pain. °· You have black, tarry, or bloody stools. °Document Released: 09/11/2012 Document Reviewed: 09/11/2012 °[employer]® Patient Information ©2015 [employer]. This information is not intended to replace advice given to you by your health care provider. Make sure you discuss any questions you have with your health care provider. ° °

## 2014-05-29 NOTE — Anesthesia Preprocedure Evaluation (Signed)
Anesthesia Evaluation  Patient identified by MRN, date of birth, ID band Patient awake    Reviewed: Allergy & Precautions, H&P , NPO status , Patient's Chart, lab work & pertinent test results  Airway Mallampati: II TM Distance: >3 FB Neck ROM: Full    Dental no notable dental hx.    Pulmonary asthma ,  breath sounds clear to auscultation  Pulmonary exam normal       Cardiovascular hypertension, Rhythm:Regular Rate:Normal     Neuro/Psych negative neurological ROS  negative psych ROS   GI/Hepatic negative GI ROS, Neg liver ROS,   Endo/Other  negative endocrine ROS  Renal/GU negative Renal ROS  negative genitourinary   Musculoskeletal negative musculoskeletal ROS (+) Lupus    Abdominal   Peds negative pediatric ROS (+)  Hematology negative hematology ROS (+)   Anesthesia Other Findings   Reproductive/Obstetrics negative OB ROS                           Anesthesia Physical Anesthesia Plan  ASA: II  Anesthesia Plan: MAC   Post-op Pain Management:    Induction:   Airway Management Planned: Nasal Cannula  Additional Equipment:   Intra-op Plan:   Post-operative Plan:   Informed Consent: I have reviewed the patients History and Physical, chart, labs and discussed the procedure including the risks, benefits and alternatives for the proposed anesthesia with the patient or authorized representative who has indicated his/her understanding and acceptance.   Dental advisory given  Plan Discussed with: CRNA  Anesthesia Plan Comments:         Anesthesia Quick Evaluation

## 2014-05-29 NOTE — H&P (View-Only) (Signed)
Subjective:    Patient ID: Jorge [name], male    DOB: 1981/05/01, 33 y.o.   MRN: [id number]  HPI  04/29/2014 Chief Complaint  Patient presents with  . Follow-up    Has DOE some days.  Heart burn x 1 wk.  No cough.    Cough is fair, comes and goes, occ at night, pt remains hoarse, notes more heartburn past week, awakens pt from sleep. No GI MD.  Never had an EGD. No PCP.   Notes some chest pain as well.    Review of Systems  Constitutional:   No  weight loss, night sweats,  Fevers, chills, fatigue, lassitude. HEENT:   No headaches,  Difficulty swallowing,  Tooth/dental problems,  Sore throat,                No sneezing, itching, ear ache, nasal congestion, post nasal drip,   CV:  No chest pain,  Orthopnea, PND, swelling in lower extremities, anasarca, dizziness, palpitations  GI  No heartburn, indigestion, abdominal pain, nausea, vomiting, diarrhea, change in bowel habits, loss of appetite  Resp: No shortness of breath with exertion or at rest.  No excess mucus, no productive cough,  No non-productive cough,  No coughing up of blood.  No change in color of mucus.  No wheezing.  No chest wall deformity  Skin: no rash or lesions.  GU: no dysuria, change in color of urine, no urgency or frequency.  No flank pain.  MS:  No joint pain or swelling.  No decreased range of motion.  No back pain.  Psych:  No change in mood or affect. No depression or anxiety.  No memory loss.     Objective:   Physical Exam  Filed Vitals:   04/29/14 1002  BP: 106/80  Pulse: 98  Temp: 97.8 F (36.6 C)  TempSrc: Oral  Height: 5\' 8"  (1.727 m)  Weight: 161 lb 6.4 oz (73.211 kg)  SpO2: 98%    Gen: Pleasant, well-nourished, in no distress,  normal affect  ENT: No lesions,  mouth clear,  oropharynx clear, no postnasal drip  Neck: No JVD, no TMG, no carotid bruits  Lungs: No use of accessory muscles, no dullness to percussion, distant BS  Cardiovascular: RRR, heart sounds normal, no murmur  or gallops, no peripheral edema  Abdomen: soft , tender in epigastric area, no HSM,  BS normal  Musculoskeletal: No deformities, no cyanosis +++clubbing  Neuro: alert, non focal  Skin: Warm, no lesions or rashes  No results found. Recent CT of the chest was reviewed and shows stable right lower lobe lung nodule    Assessment & Plan:   BRONCHIECTASIS Bronchiectasis flare exacerbated by high level reflux disease with microaspiration Plan Increase omeprazole to one twice daily before meals Start pepcid 40mg  at bedtime daily Reflux diet strict Referral to Dr Collene Mares or [facility] Stop brovana when current supply runs out, then start albuterol in nebulizer 3 times daily Return 4  months   Pulmonary nodule, right 6 mm right lower lobe lung nodule likely benign but will warrant followup in 6 months  GERD (gastroesophageal reflux disease) High level reflux disease likely with associated esophagitis exacerbating lung disease Referral to GI Increase proton pump inhibitor dosage Reflux diet Add nocturnal H2 blocker    Updated Medication List Outpatient Encounter Prescriptions as of 04/29/2014  Medication Sig  . albuterol (PROVENTIL HFA;VENTOLIN HFA) 108 (90 BASE) MCG/ACT inhaler Inhale 2 puffs into the lungs every 4 (four) hours as  needed for wheezing or shortness of breath.  Marland Kitchen amLODipine (NORVASC) 10 MG tablet Take 1 tablet (10 mg total) by mouth daily.  . hydroxychloroquine (PLAQUENIL) 200 MG tablet Take 2 tablets (400 mg total) by mouth daily.  . Nebulizers (COMPRESSOR/NEBULIZER) MISC Use with brovana  . omeprazole (PRILOSEC) 20 MG capsule Take 1 capsule (20 mg total) by mouth 2 (two) times daily before a meal.  . [DISCONTINUED] arformoterol (BROVANA) 15 MCG/2ML NEBU Take 2 mLs (15 mcg total) by nebulization 2 (two) times daily. DX 494. Run part B  . [DISCONTINUED] omeprazole (PRILOSEC) 20 MG capsule Take 1 capsule (20 mg total) by mouth daily.  Marland Kitchen albuterol (PROVENTIL) (2.5  MG/3ML) 0.083% nebulizer solution Take 3 mLs (2.5 mg total) by nebulization 3 (three) times daily.  . famotidine (PEPCID) 40 MG tablet Take 1 tablet (40 mg total) by mouth every evening.

## 2014-05-29 NOTE — Interval H&P Note (Signed)
History and Physical Interval Note:  05/29/2014 11:44 AM  Jorge [name]  has presented today for surgery, with the diagnosis of GERD  The various methods of treatment have been discussed with the patient and family. After consideration of risks, benefits and other options for treatment, the patient has consented to  Procedure(s): ESOPHAGOGASTRODUODENOSCOPY (EGD) WITH PROPOFOL (N/A) as a surgical intervention .  The patient's history has been reviewed, patient examined, no change in status, stable for surgery.  I have reviewed the patient's chart and labs.  Questions were answered to the patient's satisfaction.     Bethaney Oshana D

## 2014-05-29 NOTE — Transfer of Care (Signed)
Immediate Anesthesia Transfer of Care Note  Patient: Jorge [name]  Procedure(s) Performed: Procedure(s): ESOPHAGOGASTRODUODENOSCOPY (EGD) WITH PROPOFOL (N/A)  Patient Location: PACU  Anesthesia Type:MAC  Level of Consciousness: awake, sedated and patient cooperative  Airway & Oxygen Therapy: Patient Spontanous Breathing and Patient connected to face mask oxygen  Post-op Assessment: Report given to PACU RN and Post -op Vital signs reviewed and stable  Post vital signs: Reviewed and stable  Complications: No apparent anesthesia complications

## 2014-05-29 NOTE — Op Note (Signed)
[facility] [place], [id number]   OPERATIVE PROCEDURE REPORT  PATIENT: [name], [name]  MR#: [id number] BIRTHDATE: 03-14-1981  GENDER: Male ENDOSCOPIST: Carol Ada, MD ASSISTANT:   Cleda Daub, RN CGRN and Cristopher Estimable, technician PROCEDURE DATE: 05/29/2014 PROCEDURE:   EGD, diagnostic ASA CLASS:   Class III INDICATIONS:Dysphagia. MEDICATIONS: MAC sedation, administered by CRNA TOPICAL ANESTHETIC:   none  DESCRIPTION OF PROCEDURE:   After the risks benefits and alternatives of the procedure were thoroughly explained, informed consent was obtained.  The Pentax Gastroscope [id number]  endoscope was introduced through the mouth  and advanced to the second portion of the duodenum Without limitations.      The instrument was slowly withdrawn as the mucosa was fully examined.      FINDINGS: Upon initial entry into the esophagus there was significant evidence of refluxate.  In thye distal esohpagus an LA Grade D esophagitis was identified.  There was an associated stenosis and distal to this point a 5 cm hiatal hernia was noted. No evidence of any mid esohpageal strictures to suggest Scleroderma.  The esophageal stenosis was not dilated as there is a higher risk for perforation with dilation of inflamed mucosa.  The rest of the upper GI tract was normal.   Retroflexed views revealed no abnormalities.     The scope was then withdrawn from the patient and the procedure terminated.  COMPLICATIONS: There were no complications.  IMPRESSION: 1) LA Grade D esophafitis. 2) Distal esophageal stenosis. 3) 5 cm hiatal hernia.  RECOMMENDATIONS: 1) Continue with BID omeprazole. 2) Reassess in the office in 1 month.  If no difference, another PPI can be used and/or repeat EGD versus surgical intervention.  _______________________________ eSignedCarol Ada, MD 05/29/2014 1:24 PM    PATIENT NAME:  [name], Jorge MR#: [id number]

## 2014-05-29 NOTE — Anesthesia Postprocedure Evaluation (Signed)
  Anesthesia Post-op Note  Patient: Jorge [name]  Procedure(s) Performed: Procedure(s) (LRB): ESOPHAGOGASTRODUODENOSCOPY (EGD) WITH PROPOFOL (N/A)  Patient Location: PACU  Anesthesia Type: MAC  Level of Consciousness: awake and alert   Airway and Oxygen Therapy: Patient Spontanous Breathing  Post-op Pain: mild  Post-op Assessment: Post-op Vital signs reviewed, Patient's Cardiovascular Status Stable, Respiratory Function Stable, Patent Airway and No signs of Nausea or vomiting  Last Vitals:  Filed Vitals:   05/29/14 1350  BP:   Pulse: 80  Temp:   Resp: 13    Post-op Vital Signs: stable   Complications: No apparent anesthesia complications

## 2014-06-01 ENCOUNTER — Encounter (HOSPITAL_COMMUNITY): Payer: Self-pay | Admitting: Gastroenterology

## 2014-06-24 ENCOUNTER — Other Ambulatory Visit: Payer: Self-pay | Admitting: Gastroenterology

## 2014-06-24 DIAGNOSIS — K222 Esophageal obstruction: Secondary | ICD-10-CM | POA: Diagnosis not present

## 2014-06-24 DIAGNOSIS — K449 Diaphragmatic hernia without obstruction or gangrene: Secondary | ICD-10-CM | POA: Diagnosis not present

## 2014-06-24 DIAGNOSIS — K208 Other esophagitis without bleeding: Secondary | ICD-10-CM | POA: Diagnosis not present

## 2014-07-01 ENCOUNTER — Encounter (HOSPITAL_COMMUNITY): Payer: Self-pay | Admitting: Pharmacy Technician

## 2014-07-09 ENCOUNTER — Encounter (HOSPITAL_COMMUNITY): Payer: Self-pay | Admitting: *Deleted

## 2014-07-20 ENCOUNTER — Other Ambulatory Visit: Payer: Self-pay

## 2014-07-20 ENCOUNTER — Encounter (HOSPITAL_COMMUNITY): Payer: Self-pay | Admitting: Emergency Medicine

## 2014-07-20 ENCOUNTER — Emergency Department (HOSPITAL_COMMUNITY)
Admission: EM | Admit: 2014-07-20 | Discharge: 2014-07-21 | Disposition: A | Discharge status: Home / Self Care | Payer: Medicare Other | Attending: Emergency Medicine | Admitting: Emergency Medicine

## 2014-07-20 ENCOUNTER — Emergency Department (HOSPITAL_COMMUNITY): Payer: Medicare Other

## 2014-07-20 DIAGNOSIS — R079 Chest pain, unspecified: Secondary | ICD-10-CM | POA: Diagnosis present

## 2014-07-20 DIAGNOSIS — R42 Dizziness and giddiness: Secondary | ICD-10-CM | POA: Insufficient documentation

## 2014-07-20 DIAGNOSIS — J986 Disorders of diaphragm: Secondary | ICD-10-CM | POA: Diagnosis not present

## 2014-07-20 DIAGNOSIS — R911 Solitary pulmonary nodule: Secondary | ICD-10-CM | POA: Diagnosis not present

## 2014-07-20 DIAGNOSIS — J45901 Unspecified asthma with (acute) exacerbation: Secondary | ICD-10-CM | POA: Diagnosis not present

## 2014-07-20 DIAGNOSIS — M329 Systemic lupus erythematosus, unspecified: Secondary | ICD-10-CM | POA: Diagnosis not present

## 2014-07-20 DIAGNOSIS — R51 Headache: Secondary | ICD-10-CM | POA: Insufficient documentation

## 2014-07-20 DIAGNOSIS — K2100 Gastro-esophageal reflux disease with esophagitis, without bleeding: Secondary | ICD-10-CM

## 2014-07-20 DIAGNOSIS — I10 Essential (primary) hypertension: Secondary | ICD-10-CM | POA: Insufficient documentation

## 2014-07-20 DIAGNOSIS — Z79899 Other long term (current) drug therapy: Secondary | ICD-10-CM | POA: Diagnosis not present

## 2014-07-20 DIAGNOSIS — R0789 Other chest pain: Secondary | ICD-10-CM | POA: Diagnosis not present

## 2014-07-20 LAB — CBC WITH DIFFERENTIAL/PLATELET
Basophils Absolute: 0 10*3/uL (ref 0.0–0.1)
Basophils Relative: 0 % (ref 0–1)
Eosinophils Absolute: 0 10*3/uL (ref 0.0–0.7)
Eosinophils Relative: 1 % (ref 0–5)
HCT: 41.9 % (ref 39.0–52.0)
HEMOGLOBIN: 13.1 g/dL (ref 13.0–17.0)
LYMPHS ABS: 1.6 10*3/uL (ref 0.7–4.0)
LYMPHS PCT: 44 % (ref 12–46)
MCH: 24.8 pg — ABNORMAL LOW (ref 26.0–34.0)
MCHC: 31.3 g/dL (ref 30.0–36.0)
MCV: 79.4 fL (ref 78.0–100.0)
MONO ABS: 0.4 10*3/uL (ref 0.1–1.0)
MONOS PCT: 12 % (ref 3–12)
NEUTROS ABS: 1.5 10*3/uL — AB (ref 1.7–7.7)
NEUTROS PCT: 43 % (ref 43–77)
Platelets: 245 10*3/uL (ref 150–400)
RBC: 5.28 MIL/uL (ref 4.22–5.81)
RDW: 13.8 % (ref 11.5–15.5)
WBC: 3.6 10*3/uL — ABNORMAL LOW (ref 4.0–10.5)

## 2014-07-20 LAB — URINALYSIS, ROUTINE W REFLEX MICROSCOPIC
Bilirubin Urine: NEGATIVE
GLUCOSE, UA: NEGATIVE mg/dL
HGB URINE DIPSTICK: NEGATIVE
KETONES UR: NEGATIVE mg/dL
LEUKOCYTES UA: NEGATIVE
Nitrite: NEGATIVE
PROTEIN: 100 mg/dL — AB
Specific Gravity, Urine: 1.014 (ref 1.005–1.030)
Urobilinogen, UA: 0.2 mg/dL (ref 0.0–1.0)
pH: 6.5 (ref 5.0–8.0)

## 2014-07-20 LAB — COMPREHENSIVE METABOLIC PANEL
ALK PHOS: 97 U/L (ref 39–117)
ALT: 25 U/L (ref 0–53)
ANION GAP: 12 (ref 5–15)
AST: 37 U/L (ref 0–37)
Albumin: 3.8 g/dL (ref 3.5–5.2)
BUN: 13 mg/dL (ref 6–23)
CHLORIDE: 104 meq/L (ref 96–112)
CO2: 27 meq/L (ref 19–32)
CREATININE: 1.4 mg/dL — AB (ref 0.50–1.35)
Calcium: 9.2 mg/dL (ref 8.4–10.5)
GFR, EST AFRICAN AMERICAN: 76 mL/min — AB (ref >90)
GFR, EST NON AFRICAN AMERICAN: 66 mL/min — AB (ref >90)
GLUCOSE: 87 mg/dL (ref 70–99)
POTASSIUM: 4.3 meq/L (ref 3.7–5.3)
Sodium: 143 mEq/L (ref 137–147)
Total Protein: 8.7 g/dL — ABNORMAL HIGH (ref 6.0–8.3)

## 2014-07-20 LAB — RAPID URINE DRUG SCREEN, HOSP PERFORMED
Amphetamines: NOT DETECTED
BARBITURATES: NOT DETECTED
Benzodiazepines: NOT DETECTED
COCAINE: NOT DETECTED
OPIATES: NOT DETECTED
TETRAHYDROCANNABINOL: POSITIVE — AB

## 2014-07-20 LAB — I-STAT TROPONIN, ED
Troponin i, poc: 0.01 ng/mL (ref 0.00–0.08)
Troponin i, poc: 0.02 ng/mL (ref 0.00–0.08)

## 2014-07-20 LAB — D-DIMER, QUANTITATIVE: D-Dimer, Quant: 0.59 ug/mL-FEU — ABNORMAL HIGH (ref 0.00–0.48)

## 2014-07-20 LAB — URINE MICROSCOPIC-ADD ON

## 2014-07-20 LAB — LIPASE, BLOOD: LIPASE: 26 U/L (ref 11–59)

## 2014-07-20 MED ORDER — ONDANSETRON 8 MG PO TBDP
8.0000 mg | ORAL_TABLET | Freq: Once | ORAL | Status: AC
  Administered 2014-07-20: 8 mg via ORAL
  Filled 2014-07-20: qty 1

## 2014-07-20 MED ORDER — MORPHINE SULFATE 4 MG/ML IJ SOLN
4.0000 mg | Freq: Once | INTRAMUSCULAR | Status: AC
  Administered 2014-07-20: 4 mg via INTRAVENOUS
  Filled 2014-07-20: qty 1

## 2014-07-20 MED ORDER — SODIUM CHLORIDE 0.9 % IV BOLUS (SEPSIS)
1000.0000 mL | Freq: Once | INTRAVENOUS | Status: AC
  Administered 2014-07-20: 1000 mL via INTRAVENOUS

## 2014-07-20 MED ORDER — MORPHINE SULFATE 4 MG/ML IJ SOLN: 4.0000 mg | Freq: Once | INTRAMUSCULAR | Status: DC

## 2014-07-20 NOTE — ED Provider Notes (Signed)
Medical screening examination/treatment/procedure(s) were conducted as a shared visit with non-physician practitioner(s) and myself.  I personally evaluated the patient during the encounter.   EKG Interpretation   Date/Time:  Monday July 20 2014 16:19:19 EDT Ventricular Rate:  88 PR Interval:  166 QRS Duration: 67 QT Interval:  345 QTC Calculation: 417 R Axis:   -4 Text Interpretation:  Sinus rhythm Probable left atrial enlargement  Borderline T wave abnormalities No significant change since last tracing  Confirmed by [facility]  MD, Jenny Reichmann ([id number]) on 07/20/2014 4:26:14 PM     Constant sharp stabbing reproducible anterior chest wall tenderness without rash; suspect chest wall pain; typically would apply PERC as negative but given PMH of Lupus feel PERC may not be valid so D-dimer ordered.  Babette Relic, MD 08/09/14 [phone]

## 2014-07-20 NOTE — ED Notes (Signed)
Per patient-c/o non-radiating chest pain starting around an hour ago. C/o bilateral chest pain described as "it feels like someone sat a brick on my chest." Hx lupus  (dx 4 years ago), GERD (has endoscopy scheduled here on Thursday), pericarditis (2 years ago-sees MD Joya Gaskins). Hasn't taken any medications (has Nexium at home) at home today. C/o associated dizziness and nausea but denies SOB, back pain, weakness, vomiting, diarrhea, chills, fever. Awaiting PA/MD.

## 2014-07-20 NOTE — ED Provider Notes (Signed)
CSN: [id number]     Arrival date & time 07/20/14  1558 History   First MD Initiated Contact with Patient 07/20/14 2012     Chief Complaint  Patient presents with  . Chest Pain  . Nausea  . Dizziness     (Consider location/radiation/quality/duration/timing/severity/associated sxs/prior Treatment) The history is provided by the patient. No language interpreter was used.  Jorge [name] is a 33 y/o M with past medical history of lupus, asthma, pericarditis x2 years ago, hypertension, GERD, esophagitis, stricture or esophagitis, hiatal hernia presenting to the emergency department with chest pain that started at approximately 3:30 PM this afternoon while the patient was resting. Patient reported the pain is localized to the chest bilaterally described as a pressure sensation, as if a "brick" is resting on his chest described as being a constant discomfort with associated shortness of breath. Patient reported that upon arrival to the emergency department at approximately 4:30 PM he had one episode of emesis with bright red blood specks. Patient reported that he was diagnosed with acid reflux in June-July 2015 where an EGD was performed by Dr. Benson Norway, gastroenterologist. Patient stated that he does have a history of pericarditis, stated that he was diagnosed approximately 6 months after being diagnosed with lupus stash reported that he was diagnosed with lupus approximately 4 years ago. Stated that he's been having intermittent headaches that occur at night when he gets ready for bed, stated that when he wakes up in the morning the headaches are gone-reported that this is been ongoing for the past 2 years. Patient reported that he has an appointment with Dr. Joya Gaskins on 08/31/2014 - physician that is monitoring the patient's lupus. Denied melena, magnesium, difficulty breathing, fever, chills, cough, alcohol use, spicy foods, NSAID use, blurred vision, sudden loss of vision, neck pain, neck stiffness, numbness,  tingling, difficulty swallowing, cigarette use. PCP none   Past Medical History  Diagnosis Date  . Lupus   . Asthma   . Bronchopulmonary dysplasia   . History of pericarditis   . PAROTITIS, RIGHT 09/30/2009    Qualifier: Diagnosis of  By: Joya Gaskins MD, Burnett Harry   . Hypertension   . GERD (gastroesophageal reflux disease)   . Esophagitis 08/19/2012    EGD  . Stricture esophagus 08/19/2012    EGD  . Hiatal hernia 08/19/2012    EGD   Past Surgical History  Procedure Laterality Date  . Tonsillectomy    . Wisdom tooth extraction    . Esophagogastroduodenoscopy (egd) with propofol N/A 05/29/2014    Procedure: ESOPHAGOGASTRODUODENOSCOPY (EGD) WITH PROPOFOL;  Surgeon: Beryle Beams, MD;  Location: [facility];  Service: Endoscopy;  Laterality: N/A;   Family History  Problem Relation Age of Onset  . Lupus Father   . HIV Brother   . Thyroid cancer Maternal Aunt    History  Substance Use Topics  . Smoking status: Never Smoker   . Smokeless tobacco: Never Used  . Alcohol Use: Yes     Comment: Occasional wine    Review of Systems  Constitutional: Negative for fever and chills.  Eyes: Negative for visual disturbance.  Respiratory: Positive for shortness of breath. Negative for chest tightness.   Cardiovascular: Positive for chest pain.  Gastrointestinal: Positive for abdominal pain. Negative for diarrhea, constipation, blood in stool and anal bleeding.  Musculoskeletal: Negative for neck pain and neck stiffness.  Neurological: Positive for headaches. Negative for weakness and numbness.      Allergies  Review of patient's allergies indicates no known  allergies.  Home Medications   Prior to Admission medications   Medication Sig Start Date End Date Taking? Authorizing Provider  albuterol (PROVENTIL HFA;VENTOLIN HFA) 108 (90 BASE) MCG/ACT inhaler Inhale 2 puffs into the lungs every 4 (four) hours as needed for wheezing or shortness of breath. 01/14/14  Yes Barton Dubois, MD   amLODipine (NORVASC) 10 MG tablet Take 10 mg by mouth every morning.   Yes Historical Provider, MD  arformoterol (BROVANA) 15 MCG/2ML NEBU Take 15 mcg by nebulization 2 (two) times daily.   Yes Historical Provider, MD  esomeprazole (NEXIUM) 40 MG capsule Take 40 mg by mouth 2 (two) times daily.   Yes Historical Provider, MD  hydroxychloroquine (PLAQUENIL) 200 MG tablet Take 400 mg by mouth every morning.   Yes Historical Provider, MD  omeprazole (PRILOSEC) 20 MG capsule Take 20 mg by mouth daily.   Yes Historical Provider, MD   BP 168/108  Pulse 84  Temp(Src) 98 F (36.7 C) (Oral)  Resp 16  Ht 5\' 8"  (1.727 m)  Wt 161 lb (73.029 kg)  BMI 24.49 kg/m2  SpO2 100% Physical Exam  Nursing note and vitals reviewed. Constitutional: He is oriented to person, place, and time. He appears well-developed and well-nourished. No distress.  HENT:  Head: Normocephalic and atraumatic.  Mouth/Throat: Oropharynx is clear and moist. No oropharyngeal exudate.  Eyes: Conjunctivae and EOM are normal. Pupils are equal, round, and reactive to light. Right eye exhibits no discharge. Left eye exhibits no discharge.  Negative nystagmus bilaterally Visual fields grossly intact  Neck: Normal range of motion. Neck supple. No tracheal deviation present.  Negative neck stiffness Negative nuchal rigidity Negative cervical lymphadenopathy Negative meningeal signs  Cardiovascular: Normal rate, regular rhythm and normal heart sounds.   Pulses:      Radial pulses are 2+ on the right side, and 2+ on the left side.       Dorsalis pedis pulses are 2+ on the right side, and 2+ on the left side.  Cap refill less than 3 seconds Negative swelling or pitting edema identified to lower extremities bilaterally  Pulmonary/Chest: Effort normal and breath sounds normal. No respiratory distress. He has no wheezes. He has no rales. He exhibits tenderness (Pleuritic-reproducible upon palpation).  Patient is able to speak in full  sentences without difficulty Negative use of accessory muscles Negative stridor  Discomfort upon palpation to the chest wall bilaterally - negative crepitus upon palpation to the chest wall.   Abdominal: Soft. Bowel sounds are normal.  Musculoskeletal: Normal range of motion. He exhibits no edema and no tenderness.  Full ROM to upper and lower extremities without difficulty noted, negative ataxia noted.  Lymphadenopathy:    He has no cervical adenopathy.  Neurological: He is alert and oriented to person, place, and time. No cranial nerve deficit. He exhibits normal muscle tone. Coordination normal.  Cranial nerves III-XII grossly intact Strength 5+/5+ to upper and lower extremities bilaterally with resistance applied, equal distribution noted Equal grip strength bilaterally Negative facial droop Negative slurred speech Negative aphasia Patient is able to bring finger to nose bilaterally without difficulty or ataxia Patient follows commands well Patient responds to questions appropriately Negative arm drift Fine motor skills intact  Skin: Skin is warm and dry. No rash noted. He is not diaphoretic. No erythema.  Psychiatric: He has a normal mood and affect. His behavior is normal. Thought content normal.    ED Course  Procedures (including critical care time)  10:19 PM Patient seen and assessed  by attending physician, Dr. Rebeca Alert. As per attending physician recommended d-dimer and second troponin. Reported that the pain is mainly musculoskeletal in nature.   1:46 AM This provider re-assessed the patient. Patient sitting comfortably. Discussed labs and imaging in great detail with patient - agreed to plan of care.   Results for orders placed during the hospital encounter of 07/20/14  CBC WITH DIFFERENTIAL      Result Value Ref Range   WBC 3.6 (*) 4.0 - 10.5 K/uL   RBC 5.28  4.22 - 5.81 MIL/uL   Hemoglobin 13.1  13.0 - 17.0 g/dL   HCT 41.9  39.0 - 52.0 %   MCV 79.4  78.0 - 100.0  fL   MCH 24.8 (*) 26.0 - 34.0 pg   MCHC 31.3  30.0 - 36.0 g/dL   RDW 13.8  11.5 - 15.5 %   Platelets 245  150 - 400 K/uL   Neutrophils Relative % 43  43 - 77 %   Neutro Abs 1.5 (*) 1.7 - 7.7 K/uL   Lymphocytes Relative 44  12 - 46 %   Lymphs Abs 1.6  0.7 - 4.0 K/uL   Monocytes Relative 12  3 - 12 %   Monocytes Absolute 0.4  0.1 - 1.0 K/uL   Eosinophils Relative 1  0 - 5 %   Eosinophils Absolute 0.0  0.0 - 0.7 K/uL   Basophils Relative 0  0 - 1 %   Basophils Absolute 0.0  0.0 - 0.1 K/uL  COMPREHENSIVE METABOLIC PANEL      Result Value Ref Range   Sodium 143  137 - 147 mEq/L   Potassium 4.3  3.7 - 5.3 mEq/L   Chloride 104  96 - 112 mEq/L   CO2 27  19 - 32 mEq/L   Glucose, Bld 87  70 - 99 mg/dL   BUN 13  6 - 23 mg/dL   Creatinine, Ser 1.40 (*) 0.50 - 1.35 mg/dL   Calcium 9.2  8.4 - 10.5 mg/dL   Total Protein 8.7 (*) 6.0 - 8.3 g/dL   Albumin 3.8  3.5 - 5.2 g/dL   AST 37  0 - 37 U/L   ALT 25  0 - 53 U/L   Alkaline Phosphatase 97  39 - 117 U/L   Total Bilirubin <0.2 (*) 0.3 - 1.2 mg/dL   GFR calc non Af Amer 66 (*) >90 mL/min   GFR calc Af Amer 76 (*) >90 mL/min   Anion gap 12  5 - 15  LIPASE, BLOOD      Result Value Ref Range   Lipase 26  11 - 59 U/L  URINALYSIS, ROUTINE W REFLEX MICROSCOPIC      Result Value Ref Range   Color, Urine YELLOW  YELLOW   APPearance CLEAR  CLEAR   Specific Gravity, Urine 1.014  1.005 - 1.030   pH 6.5  5.0 - 8.0   Glucose, UA NEGATIVE  NEGATIVE mg/dL   Hgb urine dipstick NEGATIVE  NEGATIVE   Bilirubin Urine NEGATIVE  NEGATIVE   Ketones, ur NEGATIVE  NEGATIVE mg/dL   Protein, ur 100 (*) NEGATIVE mg/dL   Urobilinogen, UA 0.2  0.0 - 1.0 mg/dL   Nitrite NEGATIVE  NEGATIVE   Leukocytes, UA NEGATIVE  NEGATIVE  URINE MICROSCOPIC-ADD ON      Result Value Ref Range   Squamous Epithelial / LPF RARE  RARE   Casts HYALINE CASTS (*) NEGATIVE  URINE RAPID DRUG SCREEN (HOSP PERFORMED)  Result Value Ref Range   Opiates NONE DETECTED  NONE DETECTED    Cocaine NONE DETECTED  NONE DETECTED   Benzodiazepines NONE DETECTED  NONE DETECTED   Amphetamines NONE DETECTED  NONE DETECTED   Tetrahydrocannabinol POSITIVE (*) NONE DETECTED   Barbiturates NONE DETECTED  NONE DETECTED  D-DIMER, QUANTITATIVE      Result Value Ref Range   D-Dimer, Quant 0.59 (*) 0.00 - 0.48 ug/mL-FEU  I-STAT TROPOININ, ED      Result Value Ref Range   Troponin i, poc 0.01  0.00 - 0.08 ng/mL   Comment 3           I-STAT TROPOININ, ED      Result Value Ref Range   Troponin i, poc 0.02  0.00 - 0.08 ng/mL   Comment 3             Labs Review Labs Reviewed  CBC WITH DIFFERENTIAL - Abnormal; Notable for the following:    WBC 3.6 (*)    MCH 24.8 (*)    Neutro Abs 1.5 (*)    All other components within normal limits  COMPREHENSIVE METABOLIC PANEL - Abnormal; Notable for the following:    Creatinine, Ser 1.40 (*)    Total Protein 8.7 (*)    Total Bilirubin <0.2 (*)    GFR calc non Af Amer 66 (*)    GFR calc Af Amer 76 (*)    All other components within normal limits  URINALYSIS, ROUTINE W REFLEX MICROSCOPIC - Abnormal; Notable for the following:    Protein, ur 100 (*)    All other components within normal limits  URINE MICROSCOPIC-ADD ON - Abnormal; Notable for the following:    Casts HYALINE CASTS (*)    All other components within normal limits  URINE RAPID DRUG SCREEN (HOSP PERFORMED) - Abnormal; Notable for the following:    Tetrahydrocannabinol POSITIVE (*)    All other components within normal limits  D-DIMER, QUANTITATIVE - Abnormal; Notable for the following:    D-Dimer, Quant 0.59 (*)    All other components within normal limits  LIPASE, BLOOD  I-STAT TROPOININ, ED  I-STAT TROPOININ, ED    Imaging Review Dg Chest 2 View  07/20/2014   CLINICAL DATA:  Acute onset anterior chest pain. Prior history of lupus and pericarditis.  EXAM: CHEST  2 VIEW  COMPARISON:  CT chest 04/14/2014  FINDINGS: Normal cardiac silhouette. There is mild perihilar  bronchitic markings which are chronic. No focal consolidation. No pleural fluid. No pneumothorax. No adenopathy. No acute osseous abnormality.  IMPRESSION: Chronic bronchitic markings.  No acute findings   Electronically Signed   By: Suzy Bouchard M.D.   On: 07/20/2014 22:14     EKG Interpretation   Date/Time:  Monday July 20 2014 16:19:19 EDT Ventricular Rate:  88 PR Interval:  166 QRS Duration: 67 QT Interval:  345 QTC Calculation: 417 R Axis:   -4 Text Interpretation:  Sinus rhythm Probable left atrial enlargement  Borderline T wave abnormalities No significant change since last tracing  Confirmed by [facility]  MD, Jenny Reichmann ([id number]) on 07/20/2014 4:26:14 PM      MDM   Final diagnoses:  None    Medications  morphine 4 MG/ML injection 4 mg (not administered)  ondansetron (ZOFRAN-ODT) disintegrating tablet 8 mg (8 mg Oral Given 07/20/14 1625)  sodium chloride 0.9 % bolus 1,000 mL (1,000 mLs Intravenous New Bag/Given 07/20/14 2240)   Filed Vitals:   07/20/14 1619 07/20/14 1844 07/20/14 2003 07/20/14  2303  BP: 168/113 144/103 189/129 168/108  Pulse: 91 89  84  Temp: 98 F (36.7 C)     TempSrc: Oral     Resp: 15 16 15 16   Height: 5\' 8"  (1.727 m)     Weight: 161 lb (73.029 kg)     SpO2: 97% 100% 93% 100%    EKG noted normal sinus rhythm with a heart rate of 80 beats per minute-probable left atrial enlargement-can change this history seen. I-STAT troponin negative elevation. Second i-STAT troponin negative elevation. D-dimer elevated at 0.59. CBC noted mildly low white blood cell 3.6. Hemoglobin 13.1, hematocrit 41.9. CMP noted mildly elevated creatinine of 1.40 - when compared to previous labs, patient's creatinine has been as high as 1.54. Lipase negative elevation. Urinalysis negative nitrites, leukocytes-negative findings of infection. Urine drug screen positive for cannabis. Chest x-ray noted chronic bronchitis changes-no acute abnormalities noted. CT angiogram of chest no  acute cardiopulmonary disease noted-no evidence of pulmonary embolism recurrent per cardial effusion. Unchanged perihilar predominant bronchial wall thickening that could be seen in the setting of airway disease. Punctate nodule, approximately 6 mm noted in the right middle lobe is stable since April 2013. Nonspecific mild diffuse patulous distention of the esophagus. Doubt ACS. Negative findings of PE. Negative findings of pneumonia. Doubt perforated esophagus. Suspicion to be musculoskeletal secondary to pain upon palpation - reproducible. Cannot rule out possible irritation from acid reflux. Patient seen and assessed by attending physician, Dr. Rebeca Alert. Patient stable, afebrile. Patient not septic appearing. Blood pressure mildly elevated - patient has history of hypertension, stated that he is taking medications - no sign of end organ damage, Creatinine appears to be chronically elevated. Discharged patient. Discharged patient with pain medications - discussed course, precautions, disposal technique. Discussed with patient to rest and stay hydrated. Discussed with patient to avoid NSAIDs, spicy foods, alcohol. Discussed with patient to keep appointment with GI for EGD on 07/23/2014. Discussed with patient to closely monitor symptoms and if symptoms are to worsen or change to report back to the ED - strict return instructions given.  Patient agreed to plan of care, understood, all questions answered.   Jamse Mead, PA-C 07/21/14 [phone]

## 2014-07-21 ENCOUNTER — Encounter (HOSPITAL_COMMUNITY): Payer: Self-pay | Admitting: Anesthesiology

## 2014-07-21 ENCOUNTER — Encounter (HOSPITAL_COMMUNITY): Payer: Self-pay | Admitting: Radiology

## 2014-07-21 DIAGNOSIS — J986 Disorders of diaphragm: Secondary | ICD-10-CM | POA: Diagnosis not present

## 2014-07-21 DIAGNOSIS — R079 Chest pain, unspecified: Secondary | ICD-10-CM | POA: Diagnosis not present

## 2014-07-21 DIAGNOSIS — R911 Solitary pulmonary nodule: Secondary | ICD-10-CM | POA: Diagnosis not present

## 2014-07-21 MED ORDER — HYDROCODONE-ACETAMINOPHEN 5-325 MG PO TABS: 1.0000 | ORAL_TABLET | Freq: Four times a day (QID) | ORAL | Status: DC | PRN

## 2014-07-21 MED ORDER — IOHEXOL 350 MG/ML SOLN
100.0000 mL | Freq: Once | INTRAVENOUS | Status: AC | PRN
  Administered 2014-07-21: 100 mL via INTRAVENOUS

## 2014-07-21 NOTE — Discharge Instructions (Signed)
Please call your doctor for a followup appointment within 24-48 hours. When you talk to your doctor please let them know that you were seen in the emergency department and have them acquire all of your records so that they can discuss the findings with you and formulate a treatment plan to fully care for your new and ongoing problems. Please call and set-up an appointment with Health and [place] or Urgent [place] to be seen and re-assessed, will need blood pressure to be rechecked within 48 hours Please rest and stay hydrated Please continue to take blood pressure medication as prescribed Please drink plenty of water Will need to get creatinine re-assessed Please avoid any physical or strenuous activity  Please keep appointment with Dr. Benson Norway on 07/23/2014 Please take medications as prescribed - while on pain medications there is to be no drinking alcohol, driving, operating any heavy machinery. If extra please dispose in a proper manner. Please do not take any extra Tylenol with this medication for this can lead to Tylenol overdose and liver issues.  Please continue to monitor symptoms closely and if symptoms are to worsen or change (fever greater than 101, chills, sweating, nausea, vomiting, chest pain, shortness of breathe, difficulty breathing, weakness, numbness, tingling, worsening or changes to pain pattern, coughing up blood, vomiting up blood, blood in the stools, black tarry stools, fainting) please report back to the Emergency Department immediately.     Chest Pain (Nonspecific) It is often hard to give a diagnosis for the cause of chest pain. There is always a chance that your pain could be related to something serious, such as a heart attack or a blood clot in the lungs. You need to follow up with your doctor. HOME CARE  If antibiotic medicine was given, take it as directed by your doctor. Finish the medicine even if you start to feel better.  For the next few days, avoid  activities that bring on chest pain. Continue physical activities as told by your doctor.  Do not use any tobacco products. This includes cigarettes, chewing tobacco, and e-cigarettes.  Avoid drinking alcohol.  Only take medicine as told by your doctor.  Follow your doctor's suggestions for more testing if your chest pain does not go away.  Keep all doctor visits you made. GET HELP IF:  Your chest pain does not go away, even after treatment.  You have a rash with blisters on your chest.  You have a fever. GET HELP RIGHT AWAY IF:   You have more pain or pain that spreads to your arm, neck, jaw, back, or belly (abdomen).  You have shortness of breath.  You cough more than usual or cough up blood.  You have very bad back or belly pain.  You feel sick to your stomach (nauseous) or throw up (vomit).  You have very bad weakness.  You pass out (faint).  You have chills. This is an emergency. Do not wait to see if the problems will go away. Call your local emergency services (911 in U.S[place] Do not drive yourself to the hospital. MAKE SURE YOU:   Understand these instructions.  Will watch your condition.  Will get help right away if you are not doing well or get worse. Document Released: 03/13/2008 Document Revised: 09/30/2013 Document Reviewed: 03/13/2008 [facility] Patient Information 2015 [place], [place]. This information is not intended to replace advice given to you by your health care provider. Make sure you discuss any questions you have with your health care  provider.   Emergency Department Resource Guide 1) Find a Doctor and Pay Out of Pocket Although you won't have to find out who is covered by your insurance plan, it is a good idea to ask around and get recommendations. You will then need to call the office and see if the doctor you have chosen will accept you as a new patient and what types of options they offer for patients who are self-pay. Some doctors offer  discounts or will set up payment plans for their patients who do not have insurance, but you will need to ask so you aren't surprised when you get to your appointment.  2) Contact Your Local Health Department Not all health departments have doctors that can see patients for sick visits, but many do, so it is worth a call to see if yours does. If you don't know where your local health department is, you can check in your phone book. The CDC also has a tool to help you locate your state's health department, and many state websites also have listings of all of their local health departments.  3) Find a [facility] If your illness is not likely to be very severe or complicated, you may want to try a walk in clinic. These are popping up all over the country in pharmacies, drugstores, and shopping centers. They're usually staffed by nurse practitioners or physician assistants that have been trained to treat common illnesses and complaints. They're usually fairly quick and inexpensive. However, if you have serious medical issues or chronic medical problems, these are probably not your best option.  No Primary Care Doctor: - Call Health Connect at  [phone] - they can help you locate a primary care doctor that  accepts your insurance, provides certain services, etc. - Physician Referral Service- [phone]  Chronic Pain Problems: Organization         Address  Phone   Notes  [facility]  [phone] Patients need to be referred by their primary care doctor.   Medication Assistance: Organization         Address  Phone   Notes  [facility] Medication [facility] [address] [place], [place] [address] [phone] --Must be a resident of [facility] -- Must have NO insurance coverage whatsoever (no Medicaid/ Medicare, etc.) -- The pt. MUST have a primary care doctor that directs their care regularly and follows them in the community   MedAssist   [phone]   [employer]  [phone]    Agencies that provide inexpensive medical care: Organization         Address  Phone   Notes  [place]  [phone]   [facility]    [phone]   [facility] [place], [place] [address] [phone]   [place] [address] [place] [phone]   Planned Parenthood    [phone]   [facility]    [phone]   [facility] [address], [place] Phone:  [phone], Fax:  [phone] Hours of Operation:  9 am - 6 pm, M-F.  Also accepts Medicaid/Medicare and self-pay.  [facility] for [place], [address], [place] Phone: [phone], Fax: [phone]. Hours of Operation:  8:30 am - 5:30 pm, M-F.  Also accepts Medicaid and self-pay.  [facility] [place] [address] [place] Phone: [phone]   [place], [place], [place] [phone], Ext. [phone] Mondays & Thursdays: 7-9 AM.  First 15 patients are seen on a first come, first serve basis.    [place] Providers:  Organization         Address  Phone   Notes  [facility] [address], [place] [phone] Also accepts self-pay patients.  [facility] [id number] [place], [place]  [phone]   [place], [address], [place] [phone]   [facility] [address] [place] [phone]   Lucianne Lei [address], [place]   [phone] Only accepts [place] Access [place] patients after they have their name applied to their card.   Self-Pay (no insurance) in [facility]:  Organization         Address  Phone   Notes  Sickle Cell Patients, [employer] Internal Medicine [place] [phone]   [facility] Urgent Care [place] [phone]   [facility] [place]  [place], [place], [place] [phone]   [facility]/Dr. Osei-Bonsu  [address], [place] or [address], [place] [phone] Phone number for both [place] and [place] locations is the same.  Urgent [facility] [address], [place] [phone]   [facility] [address], [place] or [address] [phone]   [facility] [address], [place] [phone], phone; [phone], fax Sees patients 1st and 3rd Saturday of every month.  Must not qualify for public or private insurance (i.e. Medicaid, Medicare, [place] Health Choice, Veterans' Benefits)  Household income should be no more than 200% of the poverty level The clinic cannot treat you if you are pregnant or think you are pregnant  Sexually transmitted diseases are not treated at the clinic.    Dental Care: Organization         Address  Phone  Notes  [facility] Department of [facility] [place] [phone] Accepts children up to age 14 who are enrolled in [place] or [place]; pregnant women with a Medicaid card; and children who have applied for Medicaid or [place] Health Choice, but were declined, whose parents can pay a reduced fee at time of service.  [facility] Department of [facility]  [address], [place] [phone] Accepts children up to age 46 who are enrolled in [place] or [place]; pregnant women with a Medicaid card; and children who have applied for Medicaid or [place] Health Choice, but were declined, whose parents can pay a reduced fee at time of service.  [place] Adult Dental Access PROGRAM  [place]  [phone] Patients are seen by appointment only. Walk-ins are not accepted. [place] will see patients 30 years of age and older. Monday - Tuesday (8am-5pm) Most Wednesdays (8:30-5pm) $30 per visit, cash only  [facility] Adult Dental Access PROGRAM  [address], [facility] [phone] Patients are seen by appointment only. Walk-ins are not accepted. [place] will see patients 18 years of  age and older. One Wednesday Evening (Monthly: Volunteer Based).  $30 per visit, cash only  [place]  [phone] for adults; Children under age 72, call Graduate Pediatric Dentistry at [phone]. Children aged 80-14, please call [phone] to request a pediatric application.  Dental services are provided in all areas of dental care including fillings, crowns and bridges, complete and partial dentures, implants, gum treatment, root canals, and extractions. Preventive care is also provided. Treatment is provided to both adults and children. Patients are selected via a lottery and there is often a waiting list.   [facility] [address] [place]  [phone] [email or web address]   [facility] [address] [place], [place] [phone], Ext. [phone] Second and Fourth Thursday of each month, opens at 6:30 AM; Clinic ends at 9 AM.  Patients are seen on a first-come first-served basis, and a limited number are seen during each clinic.   [facility]  [address] Hill[place] Danker [place], [place] [phone]   Eligibility Requirements You must have lived in [place], [place], or [place] counties for at least the last three months.   You cannot be eligible for state or federal sponsored [employer], including [employer], [place], or [employer].   You generally cannot be eligible for healthcare insurance through your employer.    How to apply: Eligibility screenings are held every Tuesday and Wednesday  afternoon from 1:00 pm until 4:00 pm. You do not need an appointment for the interview!  [facility] [address] [place], [place]   [place]  [place] Department  [place]  [phone]    Behavioral Health Resources in the Community: Intensive Outpatient Programs Organization         Address  Phone  Notes  [facility] [place]. [address] [place], [place] [phone]   [facility] Outpatient [address] [place], [place]   ADS: Alcohol & Drug Svcs [address], [place], [place]   [place] [address]  [place] or [phone]   Substance Abuse Resources Organization         Address  Phone  Notes  Alcohol and Drug Services  [phone]   [place]  [phone]   The [place]   Chinita Pester  [phone]   Residential & Outpatient Substance Abuse Program  [phone]   Psychological Services Organization         Address  Phone  Notes  [facility] [place]  [place]  [phone]   [place] [address], [place] or [phone]    Mobile Crisis Teams Organization         Address  Phone  Notes  [facility], Mobile Crisis Care Unit  [phone]   Assertive Psychotherapeutic Services  [address] [place], [place]   [place] Levels [address] [place] [phone]    Self-Help/Support Groups Organization         Address  Phone             Notes  [place]. of Eugenio Saenz - variety of support groups  [place] Call for more information  Narcotics Anonymous (NA), [facility] [address], [place]  2 meetings  at this location   Residential Treatment  Programs Organization         Address  Phone  Notes  [facility] [address],    [place]  [phone]   [facility]  [address] [place] [id number], [place], [place]   [place], [place] [phone] Admissions: 8am-3pm M-F  Incentives Substance [place] [address]    [place], [place] [phone]   [facility] [address] [place], [place], [place]   [facility] [address]  [place], [place]   [facility]ight Programs - Intensive Outpatient [address] Kristeen Mans [address], [place], [place]   [facility] ([place] [place].,  [place], [place] [phone] or [phone]   [facility] Services (RTS) [address], [place], [place] Accepts Medicaid  [facility] [place] [address],  [place] [phone] Substance Abuse/Addiction Treatment   [facility] The Organization         Address  Phone  Notes  [facility]  [phone]   Domenic Schwab, PhD [address] Arlis Porta [place], [place]   [phone] or [phone]   [place] Romeo Delle[place], [place] [phone]   [facility] [address] [place], [place] [phone] Insurance/Medicaid/sponsorship through [facility] and Families [address] [place]                                    [place], [place] [phone] [place] [address], [place] [phone]    Dr. Adele Schilder  [phone]   [facility] Dept. 1) [address], [place] 2) [place] 3)  [place] [address], [place] [phone]  [phone]   [place] [phone] or [phone] (After Hours)

## 2014-07-23 ENCOUNTER — Ambulatory Visit (HOSPITAL_COMMUNITY): Admission: RE | Admit: 2014-07-23 | Payer: Medicare Other | Source: Ambulatory Visit | Admitting: Gastroenterology

## 2014-07-23 ENCOUNTER — Encounter (HOSPITAL_COMMUNITY): Payer: Self-pay | Admitting: Pharmacy Technician

## 2014-07-23 ENCOUNTER — Encounter (HOSPITAL_COMMUNITY): Payer: Self-pay | Admitting: *Deleted

## 2014-07-23 SURGERY — ESOPHAGOGASTRODUODENOSCOPY (EGD) WITH PROPOFOL
Anesthesia: Monitor Anesthesia Care

## 2014-07-23 NOTE — Progress Notes (Signed)
07-23-14 1015 Advised patient no recreational drug use or alcohol use.

## 2014-07-24 ENCOUNTER — Encounter (HOSPITAL_COMMUNITY): Payer: Self-pay | Admitting: Anesthesiology

## 2014-07-24 ENCOUNTER — Encounter (HOSPITAL_COMMUNITY)
Admission: RE | Disposition: A | Discharge status: Home / Self Care | Payer: Self-pay | Source: Ambulatory Visit | Attending: Gastroenterology

## 2014-07-24 ENCOUNTER — Encounter (HOSPITAL_COMMUNITY): Payer: Medicare Other | Admitting: Anesthesiology

## 2014-07-24 ENCOUNTER — Ambulatory Visit (HOSPITAL_COMMUNITY): Payer: Medicare Other | Admitting: Anesthesiology

## 2014-07-24 ENCOUNTER — Ambulatory Visit (HOSPITAL_COMMUNITY)
Admission: RE | Admit: 2014-07-24 | Discharge: 2014-07-24 | Disposition: A | Discharge status: Home / Self Care | Payer: Medicare Other | Source: Ambulatory Visit | Attending: Gastroenterology | Admitting: Gastroenterology

## 2014-07-24 DIAGNOSIS — K208 Other esophagitis: Secondary | ICD-10-CM | POA: Diagnosis not present

## 2014-07-24 DIAGNOSIS — K219 Gastro-esophageal reflux disease without esophagitis: Secondary | ICD-10-CM | POA: Insufficient documentation

## 2014-07-24 DIAGNOSIS — K222 Esophageal obstruction: Secondary | ICD-10-CM | POA: Insufficient documentation

## 2014-07-24 DIAGNOSIS — J45909 Unspecified asthma, uncomplicated: Secondary | ICD-10-CM | POA: Diagnosis not present

## 2014-07-24 DIAGNOSIS — K209 Esophagitis, unspecified: Secondary | ICD-10-CM | POA: Diagnosis not present

## 2014-07-24 DIAGNOSIS — I10 Essential (primary) hypertension: Secondary | ICD-10-CM | POA: Diagnosis not present

## 2014-07-24 DIAGNOSIS — R1310 Dysphagia, unspecified: Secondary | ICD-10-CM | POA: Diagnosis not present

## 2014-07-24 DIAGNOSIS — K449 Diaphragmatic hernia without obstruction or gangrene: Secondary | ICD-10-CM | POA: Diagnosis not present

## 2014-07-24 HISTORY — PX: ESOPHAGOGASTRODUODENOSCOPY (EGD) WITH PROPOFOL: SHX5813

## 2014-07-24 SURGERY — ESOPHAGOGASTRODUODENOSCOPY (EGD) WITH PROPOFOL
Anesthesia: Monitor Anesthesia Care

## 2014-07-24 MED ORDER — LACTATED RINGERS IV SOLN
INTRAVENOUS | Status: DC
  Administered 2014-07-24: 1000 mL via INTRAVENOUS

## 2014-07-24 MED ORDER — SODIUM CHLORIDE 0.9 % IV SOLN: INTRAVENOUS | Status: DC

## 2014-07-24 MED ORDER — MIDAZOLAM HCL 2 MG/2ML IJ SOLN
INTRAMUSCULAR | Status: AC
  Filled 2014-07-24: qty 2

## 2014-07-24 MED ORDER — BUTAMBEN-TETRACAINE-BENZOCAINE 2-2-14 % EX AERO
INHALATION_SPRAY | CUTANEOUS | Status: DC | PRN
  Administered 2014-07-24: 1 via TOPICAL

## 2014-07-24 MED ORDER — LIDOCAINE HCL (CARDIAC) 20 MG/ML IV SOLN
INTRAVENOUS | Status: DC | PRN
  Administered 2014-07-24: 100 mg via INTRAVENOUS

## 2014-07-24 MED ORDER — PROPOFOL 10 MG/ML IV BOLUS
INTRAVENOUS | Status: AC
  Filled 2014-07-24: qty 20

## 2014-07-24 MED ORDER — PROPOFOL INFUSION 10 MG/ML OPTIME
INTRAVENOUS | Status: DC | PRN
Start: 2014-07-24 — End: 2014-07-24
  Administered 2014-07-24: 80 ug/kg/min via INTRAVENOUS

## 2014-07-24 MED ORDER — MIDAZOLAM HCL 5 MG/5ML IJ SOLN
INTRAMUSCULAR | Status: DC | PRN
  Administered 2014-07-24: 2 mg via INTRAVENOUS

## 2014-07-24 SURGICAL SUPPLY — 14 items

## 2014-07-24 NOTE — Anesthesia Postprocedure Evaluation (Signed)
  Anesthesia Post-op Note  Patient: Jorge [name]  Procedure(s) Performed: Procedure(s) (LRB): ESOPHAGOGASTRODUODENOSCOPY (EGD) WITH PROPOFOL (N/A)  Patient Location: PACU  Anesthesia Type: MAC  Level of Consciousness: awake and alert   Airway and Oxygen Therapy: Patient Spontanous Breathing  Post-op Pain: mild  Post-op Assessment: Post-op Vital signs reviewed, Patient's Cardiovascular Status Stable, Respiratory Function Stable, Patent Airway and No signs of Nausea or vomiting  Last Vitals:  Filed Vitals:   07/24/14 1450  BP: 162/102  Pulse: 80  Temp:   Resp: 18    Post-op Vital Signs: stable   Complications: No apparent anesthesia complications

## 2014-07-24 NOTE — Transfer of Care (Signed)
Immediate Anesthesia Transfer of Care Note  Patient: Jorge [name]  Procedure(s) Performed: Procedure(s): ESOPHAGOGASTRODUODENOSCOPY (EGD) WITH PROPOFOL (N/A)  Patient Location: PACU  Anesthesia Type:MAC  Level of Consciousness: sedated  Airway & Oxygen Therapy: Patient Spontanous Breathing and Patient connected to nasal cannula oxygen  Post-op Assessment: Report given to PACU RN and Post -op Vital signs reviewed and stable  Post vital signs: Reviewed and stable  Complications: No apparent anesthesia complications

## 2014-07-24 NOTE — Anesthesia Preprocedure Evaluation (Addendum)
Anesthesia Evaluation  Patient identified by MRN, date of birth, ID band Patient awake  General Assessment Comment:Lupus Bronchiectasis HTN Bronchopulmonary dysplasia Hiatal Hernia  Reviewed: Allergy & Precautions, H&P , NPO status , Patient's Chart, lab work & pertinent test results  Airway Mallampati: II TM Distance: >3 FB Neck ROM: Full    Dental no notable dental hx.    Pulmonary asthma ,  breath sounds clear to auscultation  Pulmonary exam normal       Cardiovascular hypertension, Pt. on medications Rhythm:Regular Rate:Normal     Neuro/Psych  Neuromuscular disease negative psych ROS   GI/Hepatic Neg liver ROS, hiatal hernia, GERD-  Medicated,  Endo/Other  negative endocrine ROS  Renal/GU negative Renal ROS  negative genitourinary   Musculoskeletal negative musculoskeletal ROS (+)   Abdominal   Peds negative pediatric ROS (+)  Hematology negative hematology ROS (+)   Anesthesia Other Findings   Reproductive/Obstetrics negative OB ROS                          Anesthesia Physical Anesthesia Plan  ASA: II  Anesthesia Plan: MAC   Post-op Pain Management:    Induction: Intravenous  Airway Management Planned:   Additional Equipment:   Intra-op Plan:   Post-operative Plan:   Informed Consent: I have reviewed the patients History and Physical, chart, labs and discussed the procedure including the risks, benefits and alternatives for the proposed anesthesia with the patient or authorized representative who has indicated his/her understanding and acceptance.   Dental advisory given  Plan Discussed with: CRNA  Anesthesia Plan Comments:        Anesthesia Quick Evaluation

## 2014-07-24 NOTE — Discharge Instructions (Addendum)

## 2014-07-24 NOTE — H&P (View-Only) (Signed)
07-23-14 1015 Advised patient no recreational drug use or alcohol use.

## 2014-07-24 NOTE — Op Note (Signed)
[facility] [place], [address]   ENDOSCOPY PROCEDURE REPORT  PATIENT: Jorge [name], Jorge [name]  MR#: [id number] BIRTHDATE: 12/10/1980 , 33  yrs. old GENDER: male ENDOSCOPIST:Nicoletta Hush Benson Norway, MD REFERRED BY: PROCEDURE DATE:  07/30/14 PROCEDURE:   EGD w/ balloon dilation ASA CLASS:    Class III INDICATIONS: Esophageal stricture and dysphagia MEDICATION: Monitored anesthesia care TOPICAL ANESTHETIC:   none  DESCRIPTION OF PROCEDURE:   After the risks and benefits of the procedure were explained, informed consent was obtained.  The Pentax Gastroscope [id number]  endoscope was introduced through the mouth  and advanced to the second portion of the duodenum .  The instrument was slowly withdrawn as the mucosa was fully examined.      FINDINGS: IN the distal esophagus the stricture was again identified, however, the esophagitis was markedly improved. Previously he had an LA Grade D esophagitis and now it is at an LA Grade A/B.  Just distal to the focal peptic stricture was a 3 cm hiatal hernia.  The stricture was dilated with an 18 mm balloon. At the 15 and 16.5 mm diameters the balloon moved freely.  An expected post dilation mucosal tear was noted indicating stricture disruption  Cold biopsies of the mid esophagus were obtained as there was evidence of concentric rings.  Additionally, this area was noted to be dilated.          The scope was then withdrawn from the patient and the procedure completed.  COMPLICATIONS: There were no immediate complications.  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 2) Hiatal Hernia.  RECOMMENDATIONS: 1) Continue with BID PPI. 2) Follow up in one month.   _______________________________ eSigned:  Carol Ada, MD 2014/07/30 2:35 PM     cc:  CPT CODES: ICD CODES:  The ICD and CPT codes recommended by this software are interpretations from the data that the clinical  staff has captured with the software.  The verification of the translation of this report to the ICD and CPT codes and modifiers is the sole responsibility of the health care institution and practicing physician where this report was generated.  [place]. will not be held responsible for the validity of the ICD and CPT codes included on this report.  AMA assumes no liability for data contained or not contained herein. CPT is a Designer, television/film set of the [employer].  PATIENT NAME:  Jorge [name], Jorge [name] MR#: [id number]

## 2014-07-24 NOTE — Interval H&P Note (Signed)
History and Physical Interval Note:  07/24/2014 2:27 PM  Jorge [name]  has presented today for surgery, with the diagnosis of personal history esophageal stricture  The various methods of treatment have been discussed with the patient and family. After consideration of risks, benefits and other options for treatment, the patient has consented to  Procedure(s): ESOPHAGOGASTRODUODENOSCOPY (EGD) WITH PROPOFOL (N/A) as a surgical intervention .  The patient's history has been reviewed, patient examined, no change in status, stable for surgery.  I have reviewed the patient's chart and labs.  Questions were answered to the patient's satisfaction.     Kamil Mchaffie D

## 2014-07-27 ENCOUNTER — Encounter (HOSPITAL_COMMUNITY): Payer: Self-pay | Admitting: Gastroenterology

## 2014-08-10 DIAGNOSIS — K449 Diaphragmatic hernia without obstruction or gangrene: Secondary | ICD-10-CM | POA: Diagnosis not present

## 2014-08-10 DIAGNOSIS — K219 Gastro-esophageal reflux disease without esophagitis: Secondary | ICD-10-CM | POA: Diagnosis not present

## 2014-08-10 DIAGNOSIS — K208 Other esophagitis: Secondary | ICD-10-CM | POA: Diagnosis not present

## 2014-08-24 ENCOUNTER — Encounter (HOSPITAL_COMMUNITY): Admission: RE | Payer: Self-pay | Source: Ambulatory Visit

## 2014-08-24 ENCOUNTER — Ambulatory Visit (HOSPITAL_COMMUNITY): Admission: RE | Admit: 2014-08-24 | Payer: Medicare Other | Source: Ambulatory Visit | Admitting: Gastroenterology

## 2014-08-24 SURGERY — MANOMETRY, ESOPHAGUS

## 2014-08-31 ENCOUNTER — Encounter: Payer: Self-pay | Admitting: Critical Care Medicine

## 2014-08-31 ENCOUNTER — Ambulatory Visit (INDEPENDENT_AMBULATORY_CARE_PROVIDER_SITE_OTHER): Payer: Medicare Other | Admitting: Critical Care Medicine

## 2014-08-31 VITALS — BP 146/96 | HR 98 | Temp 98.6°F | Ht 68.0 in | Wt 173.4 lb

## 2014-08-31 DIAGNOSIS — J471 Bronchiectasis with (acute) exacerbation: Secondary | ICD-10-CM | POA: Diagnosis not present

## 2014-08-31 MED ORDER — AMOXICILLIN-POT CLAVULANATE 875-125 MG PO TABS: 1.0000 | ORAL_TABLET | Freq: Two times a day (BID) | ORAL | Status: DC

## 2014-08-31 MED ORDER — PREDNISONE 10 MG PO TABS: ORAL_TABLET | ORAL | Status: DC

## 2014-08-31 NOTE — Patient Instructions (Addendum)
Prednisone 10 mg taper sent to pharmacy in it's place.  Take 4 tablets x 2 days, 3 tablets x 2 days, 2 tablets x 2 days, 1 tablet x 2 days then stop. STay on brovana twice daily Take augmentin twice daily for 7days Return 2 months

## 2014-08-31 NOTE — Progress Notes (Signed)
Subjective:    Patient ID: Jorge [name], male    DOB: 09/10/1981, 33 y.o.   MRN: [id number]  HPI    08/31/2014 Chief Complaint  Patient presents with  . 4 month follow up    DOE is unchanged.  Scratchy throat, wheezing, and increased cough with green mucus x 1 day.  No chest tightness/pain or fever.  Notes scratchy throat, low grade fever, more green mucus past 24hrs. Notes DOE.     Review of Systems Constitutional:   No  weight loss, night sweats,  Fevers, chills, fatigue, lassitude. HEENT:   No headaches,  Difficulty swallowing,  Tooth/dental problems,  Sore throat,                No sneezing, itching, ear ache, nasal congestion, post nasal drip,   CV:  No chest pain,  Orthopnea, PND, swelling in lower extremities, anasarca, dizziness, palpitations  GI  No heartburn, indigestion, abdominal pain, nausea, vomiting, diarrhea, change in bowel habits, loss of appetite  Resp: Notes  shortness of breath with exertion or at rest.  Notes excess mucus, notes productive cough,  No non-productive cough,  No coughing up of blood.  Notes change in color of mucus.  No wheezing.  No chest wall deformity  Skin: no rash or lesions.  GU: no dysuria, change in color of urine, no urgency or frequency.  No flank pain.  MS:  No joint pain or swelling.  No decreased range of motion.  No back pain.  Psych:  No change in mood or affect. No depression or anxiety.  No memory loss.     Objective:   Physical Exam Filed Vitals:   08/31/14 1213  BP: 146/96  Pulse: 98  Temp: 98.6 F (37 C)  TempSrc: Oral  Height: 5\' 8"  (1.727 m)  Weight: 173 lb 6.4 oz (78.654 kg)  SpO2: 100%    Gen: Pleasant, well-nourished, in no distress,  normal affect  ENT: No lesions,  mouth clear,  oropharynx clear, no postnasal drip  Neck: No JVD, no TMG, no carotid bruits  Lungs: No use of accessory muscles, no dullness to percussion, distant BS, expired wheeze  Cardiovascular: RRR, heart sounds normal, no  murmur or gallops, no peripheral edema  Abdomen: soft , tender in epigastric area, no HSM,  BS normal  Musculoskeletal: No deformities, no cyanosis +++clubbing  Neuro: alert, non focal  Skin: Warm, no lesions or rashes  No results found.    Assessment & Plan:   BRONCHIECTASIS Acute bronchiectasis flare  Plan Prednisone 10 mg taper sent to pharmacy in it's place.  Take 4 tablets x 2 days, 3 tablets x 2 days, 2 tablets x 2 days, 1 tablet x 2 days then stop. STay on brovana twice daily Take augmentin twice daily for 7days Return 2 months     Updated Medication List Outpatient Encounter Prescriptions as of 08/31/2014  Medication Sig  . albuterol (PROVENTIL HFA;VENTOLIN HFA) 108 (90 BASE) MCG/ACT inhaler Inhale 2 puffs into the lungs every 4 (four) hours as needed for wheezing or shortness of breath.  Marland Kitchen amLODipine (NORVASC) 10 MG tablet Take 10 mg by mouth every morning.  Marland Kitchen arformoterol (BROVANA) 15 MCG/2ML NEBU Take 15 mcg by nebulization 2 (two) times daily.  Marland Kitchen HYDROcodone-acetaminophen (NORCO/VICODIN) 5-325 MG per tablet Take 1 tablet by mouth every 6 (six) hours as needed for moderate pain or severe pain.  . hydroxychloroquine (PLAQUENIL) 200 MG tablet Take 400 mg by mouth every morning.  Marland Kitchen omeprazole (  PRILOSEC) 20 MG capsule Take 20 mg by mouth every morning.   Marland Kitchen amoxicillin-clavulanate (AUGMENTIN) 875-125 MG per tablet Take 1 tablet by mouth 2 (two) times daily.  . predniSONE (DELTASONE) 10 MG tablet Take 4 tablets x 2 days, 3 tablets x 2 days, 2 tablets x 2 days, 1 tablet x 2 days then stop  . [DISCONTINUED] esomeprazole (NEXIUM) 40 MG capsule Take 40 mg by mouth 2 (two) times daily.

## 2014-08-31 NOTE — Assessment & Plan Note (Signed)
Acute bronchiectasis flare  Plan Prednisone 10 mg taper sent to pharmacy in it's place.  Take 4 tablets x 2 days, 3 tablets x 2 days, 2 tablets x 2 days, 1 tablet x 2 days then stop. STay on brovana twice daily Take augmentin twice daily for 7days Return 2 months

## 2014-11-02 ENCOUNTER — Ambulatory Visit: Payer: Medicare Other | Admitting: Critical Care Medicine

## 2014-11-04 ENCOUNTER — Telehealth: Payer: Self-pay | Admitting: *Deleted

## 2014-11-04 ENCOUNTER — Telehealth: Payer: Self-pay | Admitting: Critical Care Medicine

## 2014-11-04 DIAGNOSIS — R911 Solitary pulmonary nodule: Secondary | ICD-10-CM

## 2014-11-04 NOTE — Telephone Encounter (Signed)
Opened in error

## 2014-11-04 NOTE — Telephone Encounter (Signed)
Spoke with pt.  Discussed below.  He verbalized understanding and would like to proceed with scan.   Order placed.  Pt aware PCCs will contact him with appt date, time, and location. He verbalized understanding and voiced no further questions or concerns at this time.

## 2014-11-04 NOTE — Telephone Encounter (Signed)
-----   Message from Elsie Stain, MD sent at 11/02/2014 10:52 AM EST ----- Regarding: Due for CT of Chest  Jorge [name]  This pt is due for a non contrasted CT of the Chest  Follow up lung nodules, lung scarring ----- Message -----    From: Elsie Stain, MD    Sent: 04/29/2014  10:36 AM      To: Elsie Stain, MD  Ct chest

## 2014-11-10 ENCOUNTER — Ambulatory Visit (INDEPENDENT_AMBULATORY_CARE_PROVIDER_SITE_OTHER)
Admission: RE | Admit: 2014-11-10 | Discharge: 2014-11-10 | Disposition: A | Discharge status: Home / Self Care | Payer: Medicare Other | Source: Ambulatory Visit | Attending: Critical Care Medicine | Admitting: Critical Care Medicine

## 2014-11-10 DIAGNOSIS — R918 Other nonspecific abnormal finding of lung field: Secondary | ICD-10-CM | POA: Diagnosis not present

## 2014-11-10 DIAGNOSIS — R911 Solitary pulmonary nodule: Secondary | ICD-10-CM

## 2014-11-10 DIAGNOSIS — I288 Other diseases of pulmonary vessels: Secondary | ICD-10-CM | POA: Diagnosis not present

## 2014-11-10 NOTE — Progress Notes (Signed)
Quick Note:  Called, spoke with pt. Discussed results and recs per Dr. Joya Gaskins. He verbalized understanding and voiced no further questions or concerns at this time. ______

## 2014-11-16 ENCOUNTER — Encounter: Payer: Self-pay | Admitting: Critical Care Medicine

## 2014-11-16 ENCOUNTER — Ambulatory Visit (INDEPENDENT_AMBULATORY_CARE_PROVIDER_SITE_OTHER): Payer: Medicare Other | Admitting: Critical Care Medicine

## 2014-11-16 VITALS — BP 150/92 | HR 96 | Temp 98.6°F | Ht 68.0 in | Wt 166.0 lb

## 2014-11-16 DIAGNOSIS — R911 Solitary pulmonary nodule: Secondary | ICD-10-CM | POA: Diagnosis not present

## 2014-11-16 DIAGNOSIS — J479 Bronchiectasis, uncomplicated: Secondary | ICD-10-CM

## 2014-11-16 NOTE — Patient Instructions (Signed)
No change in medications. You declined flu vaccines Return in          6 months

## 2014-11-16 NOTE — Assessment & Plan Note (Signed)
pulm nodules Unchanged Benign No further ct chest needed

## 2014-11-16 NOTE — Progress Notes (Signed)
Subjective:    Patient ID: Jorge [name], male    DOB: 1980-11-29, 34 y.o.   MRN: [id number]  HPI     11/16/2014 Chief Complaint  Patient presents with  . 2 month follow up    Has good days and bad days depending on weather.  Wheezing frequently now.  SOB and cough at baseline.  No chest tightness/CP.  Pt is on brovana now on nebulizer and this helps! Pt denies any significant sore throat, nasal congestion or excess secretions, fever, chills, sweats, unintended weight loss, pleurtic or exertional chest pain, orthopnea PND, or leg swelling Pt denies any increase in rescue therapy over baseline, denies waking up needing it or having any early am or nocturnal exacerbations of coughing/wheezing/or dyspnea. Pt also denies any obvious fluctuation in symptoms with  weather or environmental change or other alleviating or aggravating factors    Review of Systems Constitutional:   No  weight loss, night sweats,  Fevers, chills, fatigue, lassitude. HEENT:   No headaches,  Difficulty swallowing,  Tooth/dental problems,  Sore throat,                No sneezing, itching, ear ache, nasal congestion, post nasal drip,   CV:  No chest pain,  Orthopnea, PND, swelling in lower extremities, anasarca, dizziness, palpitations  GI  No heartburn, indigestion, abdominal pain, nausea, vomiting, diarrhea, change in bowel habits, loss of appetite  Resp: Notes  shortness of breath with exertion or at rest.  No excess mucus, no productive cough,  No non-productive cough,  No coughing up of blood.  Notes change in color of mucus.  No wheezing.  No chest wall deformity  Skin: no rash or lesions.  GU: no dysuria, change in color of urine, no urgency or frequency.  No flank pain.  MS:  No joint pain or swelling.  No decreased range of motion.  No back pain.  Psych:  No change in mood or affect. No depression or anxiety.  No memory loss.     Objective:   Physical Exam Filed Vitals:   11/16/14 1112  BP:  150/92  Pulse: 96  Temp: 98.6 F (37 C)  TempSrc: Oral  Height: 5\' 8"  (1.727 m)  Weight: 166 lb (75.297 kg)  SpO2: 98%    Gen: Pleasant, well-nourished, in no distress,  normal affect  ENT: No lesions,  mouth clear,  oropharynx clear, no postnasal drip  Neck: No JVD, no TMG, no carotid bruits  Lungs: No use of accessory muscles, no dullness to percussion, distant BS, expired wheeze  Cardiovascular: RRR, heart sounds normal, no murmur or gallops, no peripheral edema  Abdomen: soft , tender in epigastric area, no HSM,  BS normal  Musculoskeletal: No deformities, no cyanosis +++clubbing  Neuro: alert, non focal  Skin: Warm, no lesions or rashes  No results found.    Assessment & Plan:   BRONCHIECTASIS Hx of bronchopulmonary dysplasia stable at present Improved on brovana Plan Cont neb med   Pulmonary nodule, right pulm nodules Unchanged Benign No further ct chest needed     Updated Medication List Outpatient Encounter Prescriptions as of 11/16/2014  Medication Sig  . albuterol (PROVENTIL HFA;VENTOLIN HFA) 108 (90 BASE) MCG/ACT inhaler Inhale 2 puffs into the lungs every 4 (four) hours as needed for wheezing or shortness of breath.  Marland Kitchen amLODipine (NORVASC) 10 MG tablet Take 10 mg by mouth every morning.  Marland Kitchen arformoterol (BROVANA) 15 MCG/2ML NEBU Take 15 mcg by nebulization 2 (two) times  daily.  Marland Kitchen HYDROcodone-acetaminophen (NORCO/VICODIN) 5-325 MG per tablet Take 1 tablet by mouth every 6 (six) hours as needed for moderate pain or severe pain.  . hydroxychloroquine (PLAQUENIL) 200 MG tablet Take 400 mg by mouth every morning.  Marland Kitchen omeprazole (PRILOSEC) 20 MG capsule Take 20 mg by mouth every morning.   . [DISCONTINUED] amoxicillin-clavulanate (AUGMENTIN) 875-125 MG per tablet Take 1 tablet by mouth 2 (two) times daily. (Patient not taking: Reported on 11/16/2014)  . [DISCONTINUED] predniSONE (DELTASONE) 10 MG tablet Take 4 tablets x 2 days, 3 tablets x 2 days, 2 tablets x  2 days, 1 tablet x 2 days then stop (Patient not taking: Reported on 11/16/2014)

## 2014-11-16 NOTE — Assessment & Plan Note (Signed)
Hx of bronchopulmonary dysplasia stable at present Improved on brovana Plan Cont neb med

## 2015-03-10 ENCOUNTER — Encounter (HOSPITAL_COMMUNITY): Payer: Self-pay | Admitting: Emergency Medicine

## 2015-03-10 ENCOUNTER — Encounter: Payer: Self-pay | Admitting: Critical Care Medicine

## 2015-03-10 ENCOUNTER — Ambulatory Visit (INDEPENDENT_AMBULATORY_CARE_PROVIDER_SITE_OTHER)
Admission: RE | Admit: 2015-03-10 | Discharge: 2015-03-10 | Disposition: A | Discharge status: Home / Self Care | Payer: Self-pay | Source: Ambulatory Visit | Attending: Critical Care Medicine | Admitting: Critical Care Medicine

## 2015-03-10 ENCOUNTER — Other Ambulatory Visit: Payer: Self-pay

## 2015-03-10 ENCOUNTER — Ambulatory Visit (INDEPENDENT_AMBULATORY_CARE_PROVIDER_SITE_OTHER): Payer: Self-pay | Admitting: Critical Care Medicine

## 2015-03-10 ENCOUNTER — Emergency Department (HOSPITAL_COMMUNITY): Payer: Medicaid Other

## 2015-03-10 ENCOUNTER — Emergency Department (HOSPITAL_COMMUNITY)
Admission: EM | Admit: 2015-03-10 | Discharge: 2015-03-10 | Disposition: A | Discharge status: Home / Self Care | Payer: Medicaid Other | Attending: Emergency Medicine | Admitting: Emergency Medicine

## 2015-03-10 VITALS — BP 116/66 | HR 109 | Temp 99.0°F | Ht 68.0 in | Wt 139.0 lb

## 2015-03-10 DIAGNOSIS — R1031 Right lower quadrant pain: Secondary | ICD-10-CM | POA: Diagnosis not present

## 2015-03-10 DIAGNOSIS — Z79899 Other long term (current) drug therapy: Secondary | ICD-10-CM | POA: Insufficient documentation

## 2015-03-10 DIAGNOSIS — J209 Acute bronchitis, unspecified: Secondary | ICD-10-CM

## 2015-03-10 DIAGNOSIS — R197 Diarrhea, unspecified: Secondary | ICD-10-CM | POA: Diagnosis not present

## 2015-03-10 DIAGNOSIS — I10 Essential (primary) hypertension: Secondary | ICD-10-CM | POA: Insufficient documentation

## 2015-03-10 DIAGNOSIS — J479 Bronchiectasis, uncomplicated: Secondary | ICD-10-CM

## 2015-03-10 DIAGNOSIS — Z8739 Personal history of other diseases of the musculoskeletal system and connective tissue: Secondary | ICD-10-CM | POA: Insufficient documentation

## 2015-03-10 DIAGNOSIS — R1011 Right upper quadrant pain: Secondary | ICD-10-CM | POA: Insufficient documentation

## 2015-03-10 DIAGNOSIS — J45909 Unspecified asthma, uncomplicated: Secondary | ICD-10-CM | POA: Insufficient documentation

## 2015-03-10 DIAGNOSIS — K2100 Gastro-esophageal reflux disease with esophagitis, without bleeding: Secondary | ICD-10-CM

## 2015-03-10 DIAGNOSIS — Z8719 Personal history of other diseases of the digestive system: Secondary | ICD-10-CM | POA: Diagnosis not present

## 2015-03-10 DIAGNOSIS — R10817 Generalized abdominal tenderness: Secondary | ICD-10-CM | POA: Diagnosis not present

## 2015-03-10 DIAGNOSIS — K2970 Gastritis, unspecified, without bleeding: Secondary | ICD-10-CM | POA: Diagnosis not present

## 2015-03-10 LAB — URINALYSIS, ROUTINE W REFLEX MICROSCOPIC
BILIRUBIN URINE: NEGATIVE
GLUCOSE, UA: NEGATIVE mg/dL
Hgb urine dipstick: NEGATIVE
Ketones, ur: NEGATIVE mg/dL
Leukocytes, UA: NEGATIVE
Nitrite: NEGATIVE
PH: 5 (ref 5.0–8.0)
Protein, ur: 100 mg/dL — AB
Specific Gravity, Urine: 1.014 (ref 1.005–1.030)
Urobilinogen, UA: 0.2 mg/dL (ref 0.0–1.0)

## 2015-03-10 LAB — CBC WITH DIFFERENTIAL/PLATELET
Basophils Absolute: 0 10*3/uL (ref 0.0–0.1)
Basophils Relative: 0 % (ref 0–1)
EOS ABS: 0.1 10*3/uL (ref 0.0–0.7)
Eosinophils Relative: 2 % (ref 0–5)
HCT: 35.3 % — ABNORMAL LOW (ref 39.0–52.0)
HEMOGLOBIN: 11 g/dL — AB (ref 13.0–17.0)
LYMPHS PCT: 25 % (ref 12–46)
Lymphs Abs: 1.7 10*3/uL (ref 0.7–4.0)
MCH: 24.5 pg — AB (ref 26.0–34.0)
MCHC: 31.2 g/dL (ref 30.0–36.0)
MCV: 78.6 fL (ref 78.0–100.0)
Monocytes Absolute: 0.8 10*3/uL (ref 0.1–1.0)
Monocytes Relative: 12 % (ref 3–12)
NEUTROS ABS: 4.2 10*3/uL (ref 1.7–7.7)
NEUTROS PCT: 61 % (ref 43–77)
Platelets: 284 10*3/uL (ref 150–400)
RBC: 4.49 MIL/uL (ref 4.22–5.81)
RDW: 14.4 % (ref 11.5–15.5)
WBC: 6.9 10*3/uL (ref 4.0–10.5)

## 2015-03-10 LAB — COMPREHENSIVE METABOLIC PANEL
ALT: 14 U/L — ABNORMAL LOW (ref 17–63)
AST: 27 U/L (ref 15–41)
Albumin: 3.6 g/dL (ref 3.5–5.0)
Alkaline Phosphatase: 64 U/L (ref 38–126)
Anion gap: 9 (ref 5–15)
BUN: 15 mg/dL (ref 6–20)
CHLORIDE: 110 mmol/L (ref 101–111)
CO2: 22 mmol/L (ref 22–32)
Calcium: 8.5 mg/dL — ABNORMAL LOW (ref 8.9–10.3)
Creatinine, Ser: 1.77 mg/dL — ABNORMAL HIGH (ref 0.61–1.24)
GFR calc non Af Amer: 50 mL/min — ABNORMAL LOW (ref >60)
GFR, EST AFRICAN AMERICAN: 57 mL/min — AB (ref >60)
Glucose, Bld: 86 mg/dL (ref 65–99)
Potassium: 4.8 mmol/L (ref 3.5–5.1)
SODIUM: 141 mmol/L (ref 135–145)
Total Bilirubin: 0.7 mg/dL (ref 0.3–1.2)
Total Protein: 8 g/dL (ref 6.5–8.1)

## 2015-03-10 LAB — URINE MICROSCOPIC-ADD ON

## 2015-03-10 LAB — LIPASE, BLOOD: LIPASE: 16 U/L — AB (ref 22–51)

## 2015-03-10 MED ORDER — ONDANSETRON HCL 4 MG/2ML IJ SOLN
4.0000 mg | Freq: Once | INTRAMUSCULAR | Status: AC
  Administered 2015-03-10: 4 mg via INTRAVENOUS
  Filled 2015-03-10: qty 2

## 2015-03-10 MED ORDER — IOHEXOL 300 MG/ML  SOLN
80.0000 mL | Freq: Once | INTRAMUSCULAR | Status: AC | PRN
  Administered 2015-03-10: 100 mL via INTRAVENOUS

## 2015-03-10 MED ORDER — HYDROCODONE-ACETAMINOPHEN 5-325 MG PO TABS
1.0000 | ORAL_TABLET | Freq: Once | ORAL | Status: AC
  Administered 2015-03-10: 1 via ORAL
  Filled 2015-03-10: qty 1

## 2015-03-10 MED ORDER — OMEPRAZOLE 20 MG PO CPDR: 20.0000 mg | DELAYED_RELEASE_CAPSULE | Freq: Two times a day (BID) | ORAL | Status: DC

## 2015-03-10 MED ORDER — HYDROCODONE-ACETAMINOPHEN 5-325 MG PO TABS: 1.0000 | ORAL_TABLET | ORAL | Status: DC | PRN

## 2015-03-10 MED ORDER — MORPHINE SULFATE 4 MG/ML IJ SOLN: 4.0000 mg | Freq: Once | INTRAMUSCULAR | Status: DC

## 2015-03-10 MED ORDER — IOHEXOL 300 MG/ML  SOLN: 25.0000 mL | Freq: Once | INTRAMUSCULAR | Status: DC | PRN

## 2015-03-10 MED ORDER — MORPHINE SULFATE 4 MG/ML IJ SOLN
4.0000 mg | Freq: Once | INTRAMUSCULAR | Status: AC
  Administered 2015-03-10: 4 mg via INTRAVENOUS
  Filled 2015-03-10: qty 1

## 2015-03-10 MED ORDER — SODIUM CHLORIDE 0.9 % IV BOLUS (SEPSIS)
500.0000 mL | Freq: Once | INTRAVENOUS | Status: AC
  Administered 2015-03-10: 500 mL via INTRAVENOUS

## 2015-03-10 MED ORDER — MORPHINE SULFATE 4 MG/ML IJ SOLN
4.0000 mg | Freq: Once | INTRAMUSCULAR | Status: AC
Start: 2015-03-10 — End: 2015-03-10
  Administered 2015-03-10: 4 mg via INTRAVENOUS
  Filled 2015-03-10: qty 1

## 2015-03-10 NOTE — ED Notes (Addendum)
Pt stated that he defecated and it was "a little loose".

## 2015-03-10 NOTE — ED Provider Notes (Signed)
CSN: [id number]     Arrival date & time 03/10/15  1215 History   First MD Initiated Contact with Patient 03/10/15 1217     Chief Complaint  Patient presents with  . Abdominal Pain    Patient is a 34 y.o. male presenting with abdominal pain. The history is provided by the patient.  Abdominal Pain Pain location:  RUQ and RLQ Pain quality: sharp   Pain severity:  Severe Onset quality:  Gradual Duration:  2 days Timing:  Constant Progression:  Worsening Chronicity:  New Relieved by:  Nothing Worsened by:  Movement and palpation Associated symptoms: no chest pain, no dysuria, no fever and no vomiting   Pt reports RUQ/RLQ pain for 2 days He has never had this before No h/o abdominal surgeries He was seen by his pulmonologist today, sent to GI office but then was sent to ER for abdominal pain evaluation   Past Medical History  Diagnosis Date  . Lupus     "kidneys, lungs, heart"  . 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 History of pericarditis   . PAROTITIS, RIGHT 09/30/2009    Qualifier: Diagnosis of  By: Joya Gaskins MD, Burnett Harry   . Hypertension   . GERD (gastroesophageal reflux disease)   . Esophagitis 08/19/2012    EGD  . Stricture esophagus 08/19/2012    EGD  . Hiatal hernia 08/19/2012    EGD  . Asthma     stable no recent nebulizer use, uses Inhaler as needed   Past Surgical History  Procedure Laterality Date  . Tonsillectomy    . Wisdom tooth extraction    . Esophagogastroduodenoscopy (egd) with propofol N/A 05/29/2014    Procedure: ESOPHAGOGASTRODUODENOSCOPY (EGD) WITH PROPOFOL;  Surgeon: Beryle Beams, MD;  Location: [facility];  Service: Endoscopy;  Laterality: N/A;  . Esophagogastroduodenoscopy (egd) with propofol N/A 07/24/2014    Procedure: ESOPHAGOGASTRODUODENOSCOPY (EGD) WITH PROPOFOL;  Surgeon: Beryle Beams, MD;  Location: [facility];  Service: Endoscopy;  Laterality: N/A;   Family History  Problem Relation Age of Onset  .  Lupus Father   . HIV Brother   . Thyroid cancer Maternal Aunt    History  Substance Use Topics  . Smoking status: Never Smoker   . Smokeless tobacco: Never Used  . Alcohol Use: Yes     Comment: Occasional wine    Review of Systems  Constitutional: Negative for fever.  Cardiovascular: Negative for chest pain.  Gastrointestinal: Positive for abdominal pain. Negative for vomiting.  Genitourinary: Negative for dysuria and testicular pain.  All other systems reviewed and are negative.     Allergies  Review of patient's allergies indicates no known allergies.  Home Medications   Prior to Admission medications   Medication Sig Start Date End Date Taking? Authorizing Provider  albuterol (PROVENTIL HFA;VENTOLIN HFA) 108 (90 BASE) MCG/ACT inhaler Inhale 2 puffs into the lungs every 4 (four) hours as needed for wheezing or shortness of breath. 01/14/14   Barton Dubois, MD  amLODipine (NORVASC) 10 MG tablet Take 10 mg by mouth every morning.    Historical Provider, MD  arformoterol (BROVANA) 15 MCG/2ML NEBU Take 15 mcg by nebulization 2 (two) times daily.    Historical Provider, MD  hydroxychloroquine (PLAQUENIL) 200 MG tablet Take 400 mg by mouth every morning.    Historical Provider, MD  omeprazole (PRILOSEC) 20 MG capsule Take 1 capsule (20 mg total) by mouth 2 (two) times daily before a meal. 03/10/15  Elsie Stain, MD   BP 142/94 mmHg  Pulse 92  Temp(Src) 98 F (36.7 C)  Resp 22  Ht 5\' 8"  (1.727 m)  Wt 156 lb (70.761 kg)  BMI 23.73 kg/m2  SpO2 97% Physical Exam CONSTITUTIONAL: Well developed/well nourished, uncomfortable appearing HEAD: Normocephalic/atraumatic EYES: EOMI/PERRL ENMT: Mucous membranes moist NECK: supple no meningeal signs SPINE/BACK:entire spine nontender CV: S1/S2 noted, no murmurs/rubs/gallops noted LUNGS: Lungs are clear to auscultation bilaterally, no apparent distress ABDOMEN: soft, moderate RUQ and RLQ tenderness, no rebound or guarding, bowel  sounds noted throughout abdomen GU:no cva tenderness, no large inguinal hernia noted on exam NEURO: Pt is awake/alert/appropriate, moves all extremitiesx4.  No facial droop.   EXTREMITIES: pulses normal/equal, full ROM SKIN: warm, color normal PSYCH: anxious  ED Course  Procedures  12:40 PM Pt sent for abd pain evaluation He has h/o lupus but not currently on steroids Saw his pulmonologist today, told him he had CP (denies on my eval) but also abd pain and sent to GI (dr hung) then to ER for evaluation Labs pending at this time 1:55 PM Difficult IV access- I had unsuccessful attempt at left EJ (no complications) However nursing able to gain IV access in right UE Workup pending  2:31 PM Pt still with pain Will need CT imaging, however awaiting urinalysis as ureteral stone is possibility 3:34 PM Pt with continued RLQ abd pain No signs of hematuria Will proceed with CT imaging with contrast (will need reduced dose due to renal insufficiency) 4:11 PM Signed out to dr Eulis Foster with CT imaging pending  Labs Review Labs Reviewed  COMPREHENSIVE METABOLIC PANEL - Abnormal; Notable for the following:    Creatinine, Ser 1.77 (*)    Calcium 8.5 (*)    ALT 14 (*)    GFR calc non Af Amer 50 (*)    GFR calc Af Amer 57 (*)    All other components within normal limits  CBC WITH DIFFERENTIAL/PLATELET - Abnormal; Notable for the following:    Hemoglobin 11.0 (*)    HCT 35.3 (*)    MCH 24.5 (*)    All other components within normal limits  LIPASE, BLOOD - Abnormal; Notable for the following:    Lipase 16 (*)    All other components within normal limits  URINALYSIS, ROUTINE W REFLEX MICROSCOPIC (NOT AT [facility]) - Abnormal; Notable for the following:    APPearance HAZY (*)    Protein, ur 100 (*)    All other components within normal limits  URINE MICROSCOPIC-ADD ON - Abnormal; Notable for the following:    Squamous Epithelial / LPF FEW (*)    Casts HYALINE CASTS (*)    All other components  within normal limits    Imaging Review No results found.   EKG Interpretation   Date/Time:  Wednesday March 10 2015 13:50:58 EDT Ventricular Rate:  93 PR Interval:  151 QRS Duration: 74 QT Interval:  354 QTC Calculation: 440 R Axis:   25 Text Interpretation:  Sinus rhythm Consider anterior infarct Non-specific  ST-t changes No significant change since last tracing Confirmed by  Christy Gentles  MD, [place] ([id number]) on 03/10/2015 1:54:53 PM     Medications  morphine 4 MG/ML injection 4 mg (4 mg Intramuscular Not Given 03/10/15 1332)  morphine 4 MG/ML injection 4 mg (not administered)  morphine 4 MG/ML injection 4 mg (4 mg Intravenous Given 03/10/15 1324)  ondansetron (ZOFRAN) injection 4 mg (4 mg Intravenous Given 03/10/15 1324)  morphine 4 MG/ML injection 4  mg (4 mg Intravenous Given 03/10/15 1439)    MDM   Final diagnoses:  None    Nursing notes including past medical history and social history reviewed and considered in documentation Previous records reviewed and considered Labs/vital reviewed myself and considered during evaluation     Ripley Fraise, MD 03/10/15 1611

## 2015-03-10 NOTE — ED Notes (Signed)
Traci, RN at bedside attempting US guided IV.

## 2015-03-10 NOTE — ED Notes (Signed)
Pt arrived from GI MD by [facility] with c/o RUQ abd pain that started 2 days ago and has became worse since. Pt has no hx of abd pain in area. Hx of endoscopy to have esophagus stretched and GI MD stated that this was something different and sent pt to ED for eval. Pt started having diarrhea this morning as well and stated that he is nauseated but denies vomiting.

## 2015-03-10 NOTE — Progress Notes (Signed)
Subjective:    Patient ID: Jorge [name], male    DOB: 1981-05-06, 34 y.o.   MRN: [id number]  HPI 03/10/2015 Chief Complaint  Patient presents with  . Follow-up    Pain in right side of chest.  chest tightness.  some cough, but not as much as he used too. Stomach pain.      Pt notes more chest pain R side, awoke from sleep. Dull ache and pressure.  No real cough.  No mucus. No real wheeze. Pt notes some abd pain after coffee.  Pt feels nauseated. No real edema in feet.  No f/s.  Notes some chills.  Pt denies any significant sore throat, nasal congestion or excess secretions, fever, chills, sweats, unintended weight loss, pleurtic or exertional chest pain, orthopnea PND, or leg swelling Pt denies any increase in rescue therapy over baseline, denies waking up needing it or having any early am or nocturnal exacerbations of coughing/wheezing/or dyspnea. Pt also denies any obvious fluctuation in symptoms with  weather or environmental change or other alleviating or aggravating factors   Current Medications, Allergies, Complete Past Medical History, Past Surgical History, Family History, and Social History were reviewed in [facility] record per todays encounter:  03/10/2015        Review of Systems     Objective:   Physical Exam Filed Vitals:   03/10/15 1004  BP: 116/66  Pulse: 109  Temp: 99 F (37.2 C)  TempSrc: Oral  Height: 5\' 8"  (1.727 m)  Weight: 139 lb (63.05 kg)  SpO2: 97%    Gen: Pleasant, well-nourished, in no distress,  normal affect  ENT: No lesions,  mouth clear,  oropharynx clear, no postnasal drip  Neck: No JVD, no TMG, no carotid bruits  Lungs: No use of accessory muscles, no dullness to percussion,coarse BS RLL  Cardiovascular: RRR, heart sounds normal, no murmur or gallops, no peripheral edema  Abdomen: tender RUQ and epigastric area  Musculoskeletal: No deformities, no cyanosis or clubbing  Neuro: alert, non focal  Skin:  Warm, no lesions or rashes  Dg Chest 2 View  03/10/2015   CLINICAL DATA:  Right upper chest pain for the past 3 days.  Chills.  EXAM: CHEST  2 VIEW  COMPARISON:  07/20/2014.  Chest CT dated 11/10/2014.  FINDINGS: Normal sized heart. Clear lungs. Stable prominence of the hilar vessels with no adenopathy seen on the previous CT. Previously demonstrated bilateral infrahilar bronchiectasis appears more prominent. No definite airspace consolidation. Unremarkable bones.  IMPRESSION: 1. Increased infrahilar bronchiectasis. 2. Stable changes of pulmonary arterial hypertension.   Electronically Signed   By: Claudie Revering M.D.   On: 03/10/2015 14:01   Ct Abdomen Pelvis W Contrast  03/10/2015   CLINICAL DATA:  Right upper quadrant abdominal pain for 2 days.  EXAM: CT ABDOMEN AND PELVIS WITH CONTRAST  TECHNIQUE: Multidetector CT imaging of the abdomen and pelvis was performed using the standard protocol following bolus administration of intravenous contrast.  CONTRAST:  118mL OMNIPAQUE IOHEXOL 300 MG/ML  SOLN  COMPARISON:  None.  FINDINGS: Lower chest: The lung bases are clear of acute process. Mild emphysematous changes are noted with lower lobe peribronchial thickening. Stable small pulmonary nodule in the left lower lobe. No pleural effusion. The heart is normal in size. There is a small hiatal hernia and mild distal esophageal dilatation.  Hepatobiliary: No focal hepatic lesions or intrahepatic biliary dilatation. The gallbladder is normal. No common bile duct dilatation.  Pancreas: Normal  Spleen: Normal  Adrenals/Urinary Tract: The adrenal glands and kidneys are unremarkable. No renal lesions or hydronephrosis. No findings for pyelonephritis.  Stomach/Bowel: The stomach is not well distended with contrast but no gross abnormalities are seen. The duodenum, small bowel and colon are grossly normal. No inflammatory changes, mass lesions or obstructive findings. The terminal ileum appears normal. The base of the appendix  is slightly thickened measuring 8 mm but the remainder the appendix is normal and I do not see any inflammatory changes.  Vascular/Lymphatic: No mesenteric or retroperitoneal mass or adenopathy. Small scattered lymph nodes are noted. The aorta and branch vessels are normal.  Other: The bladder, prostate gland and seminal vesicles are unremarkable. No pelvic mass or lymphadenopathy. No free pelvic fluid collections. No inguinal mass or adenopathy. Small inguinal lymph nodes are noted.  Musculoskeletal: No significant bony findings.  IMPRESSION: No acute abdominal/ pelvic findings, mass lesions or lymphadenopathy.  Hiatal hernia and mild distal esophageal dilatation.   Electronically Signed   By: Marijo Sanes M.D.   On: 03/10/2015 18:09          Assessment & Plan:  I personally reviewed all images and lab data in the [facility] system as well as any outside material available during this office visit and agree with the  radiology impressions.   GERD (gastroesophageal reflux disease) Severe abdominal pain and R sided chest pain and symptoms c/w high level reflux and esophagitis, prior esoph stricture and esophagitis on prior egd Doubt primary pulm process Plan  BID PPI GI referral same day    BRONCHIECTASIS Stable Bronchopulm dysplasia from prematurity No active process on CXR No change inhaler Rx   Noted ct abd results and reviewed. Neg for acute process in lungs or abd Stclair was seen today for follow-up.  Diagnoses and all orders for this visit:  Gastroesophageal reflux disease with esophagitis Orders: -     Ambulatory referral to Gastroenterology  Acute bronchitis, unspecified organism Orders: -     DG Chest 2 View; Future  Bronchiectasis without complication  Other orders -     omeprazole (PRILOSEC) 20 MG capsule; Take 1 capsule (20 mg total) by mouth 2 (two) times daily before a meal.

## 2015-03-10 NOTE — ED Notes (Signed)
Attempting to find someone to do IV ultrasound.

## 2015-03-10 NOTE — Patient Instructions (Signed)
A Chest xray will be obtained Start omeprazole 20mg  twice daily before meals A referral to dr hung will be made No change in inhaled meds

## 2015-03-10 NOTE — ED Notes (Signed)
Peter Congo [phone] (mother)

## 2015-03-10 NOTE — Discharge Instructions (Signed)
Abdominal Pain °Many things can cause abdominal pain. Usually, abdominal pain is not caused by a disease and will improve without treatment. It can often be observed and treated at home. Your health care provider will do a physical exam and possibly order blood tests and X-rays to help determine the seriousness of your pain. However, in many cases, more time must pass before a clear cause of the pain can be found. Before that point, your health care provider may not know if you need more testing or further treatment. °HOME CARE INSTRUCTIONS  °Monitor your abdominal pain for any changes. The following actions may help to alleviate any discomfort you are experiencing: °· Only take over-the-counter or prescription medicines as directed by your health care provider. °· Do not take laxatives unless directed to do so by your health care provider. °· Try a clear liquid diet (broth, tea, or water) as directed by your health care provider. Slowly move to a bland diet as tolerated. °SEEK MEDICAL CARE IF: °· You have unexplained abdominal pain. °· You have abdominal pain associated with nausea or diarrhea. °· You have pain when you urinate or have a bowel movement. °· You experience abdominal pain that wakes you in the night. °· You have abdominal pain that is worsened or improved by eating food. °· You have abdominal pain that is worsened with eating fatty foods. °· You have a fever. °SEEK IMMEDIATE MEDICAL CARE IF:  °· Your pain does not go away within 2 hours. °· You keep throwing up (vomiting). °· Your pain is felt only in portions of the abdomen, such as the right side or the left lower portion of the abdomen. °· You pass bloody or black tarry stools. °MAKE SURE YOU: °· Understand these instructions.   °· Will watch your condition.   °· Will get help right away if you are not doing well or get worse.   °Document Released: 07/05/2005 Document Revised: 09/30/2013 Document Reviewed: 06/04/2013 °ExitCare® Patient Information  ©2015 [employer]. This information is not intended to replace advice given to you by your health care provider. Make sure you discuss any questions you have with your health care provider. ° °Diarrhea °Diarrhea is frequent loose and watery bowel movements. It can cause you to feel weak and dehydrated. Dehydration can cause you to become tired and thirsty, have a dry mouth, and have decreased urination that often is dark yellow. Diarrhea is a sign of another problem, most often an infection that will not last long. In most cases, diarrhea typically lasts 2-3 days. However, it can last longer if it is a sign of something more serious. It is important to treat your diarrhea as directed by your caregiver to lessen or prevent future episodes of diarrhea. °CAUSES  °Some common causes include: °· Gastrointestinal infections caused by viruses, bacteria, or parasites. °· Food poisoning or food allergies. °· Certain medicines, such as antibiotics, chemotherapy, and laxatives. °· Artificial sweeteners and fructose. °· Digestive disorders. °HOME CARE INSTRUCTIONS °· Ensure adequate fluid intake (hydration): Have 1 cup (8 oz) of fluid for each diarrhea episode. Avoid fluids that contain simple sugars or sports drinks, fruit juices, whole milk products, and sodas. Your urine should be clear or pale yellow if you are drinking enough fluids. Hydrate with an oral rehydration solution that you can purchase at pharmacies, retail stores, and online. You can prepare an oral rehydration solution at home by mixing the following ingredients together: °¨  - tsp table salt. °¨ ¾ tsp baking soda. °¨    tsp salt substitute containing potassium chloride. °¨ 1  tablespoons sugar. °¨ 1 L (34 oz) of water. °· Certain foods and beverages may increase the speed at which food moves through the gastrointestinal (GI) tract. These foods and beverages should be avoided and include: °¨ Caffeinated and alcoholic beverages. °¨ High-fiber foods, such as raw  fruits and vegetables, nuts, seeds, and whole grain breads and cereals. °¨ Foods and beverages sweetened with sugar alcohols, such as xylitol, sorbitol, and mannitol. °· Some foods may be well tolerated and may help thicken stool including: °¨ Starchy foods, such as rice, toast, pasta, low-sugar cereal, oatmeal, grits, baked potatoes, crackers, and bagels. °¨ Bananas. °¨ Applesauce. °· Add probiotic-rich foods to help increase healthy bacteria in the GI tract, such as yogurt and fermented milk products. °· Wash your hands well after each diarrhea episode. °· Only take over-the-counter or prescription medicines as directed by your caregiver. °· Take a warm bath to relieve any burning or pain from frequent diarrhea episodes. °SEEK IMMEDIATE MEDICAL CARE IF:  °· You are unable to keep fluids down. °· You have persistent vomiting. °· You have blood in your stool, or your stools are black and tarry. °· You do not urinate in 6-8 hours, or there is only a small amount of very dark urine. °· You have abdominal pain that increases or localizes. °· You have weakness, dizziness, confusion, or light-headedness. °· You have a severe headache. °· Your diarrhea gets worse or does not get better. °· You have a fever or persistent symptoms for more than 2-3 days. °· You have a fever and your symptoms suddenly get worse. °MAKE SURE YOU:  °· Understand these instructions. °· Will watch your condition. °· Will get help right away if you are not doing well or get worse. °Document Released: 09/15/2002 Document Revised: 02/09/2014 Document Reviewed: 06/02/2012 °ExitCare® Patient Information ©2015 [employer]. This information is not intended to replace advice given to you by your health care provider. Make sure you discuss any questions you have with your health care provider. ° °

## 2015-03-10 NOTE — ED Provider Notes (Signed)
19:10- patient seen in follow-up after CT imaging, to reassess and arrange disposition.  Results for orders placed or performed during the hospital encounter of 03/10/15  Comprehensive metabolic panel  Result Value Ref Range   Sodium 141 135 - 145 mmol/L   Potassium 4.8 3.5 - 5.1 mmol/L   Chloride 110 101 - 111 mmol/L   CO2 22 22 - 32 mmol/L   Glucose, Bld 86 65 - 99 mg/dL   BUN 15 6 - 20 mg/dL   Creatinine, Ser 1.77 (H) 0.61 - 1.24 mg/dL   Calcium 8.5 (L) 8.9 - 10.3 mg/dL   Total Protein 8.0 6.5 - 8.1 g/dL   Albumin 3.6 3.5 - 5.0 g/dL   AST 27 15 - 41 U/L   ALT 14 (L) 17 - 63 U/L   Alkaline Phosphatase 64 38 - 126 U/L   Total Bilirubin 0.7 0.3 - 1.2 mg/dL   GFR calc non Af Amer 50 (L) >60 mL/min   GFR calc Af Amer 57 (L) >60 mL/min   Anion gap 9 5 - 15  CBC with Differential/Platelet  Result Value Ref Range   WBC 6.9 4.0 - 10.5 K/uL   RBC 4.49 4.22 - 5.81 MIL/uL   Hemoglobin 11.0 (L) 13.0 - 17.0 g/dL   HCT 35.3 (L) 39.0 - 52.0 %   MCV 78.6 78.0 - 100.0 fL   MCH 24.5 (L) 26.0 - 34.0 pg   MCHC 31.2 30.0 - 36.0 g/dL   RDW 14.4 11.5 - 15.5 %   Platelets 284 150 - 400 K/uL   Neutrophils Relative % 61 43 - 77 %   Neutro Abs 4.2 1.7 - 7.7 K/uL   Lymphocytes Relative 25 12 - 46 %   Lymphs Abs 1.7 0.7 - 4.0 K/uL   Monocytes Relative 12 3 - 12 %   Monocytes Absolute 0.8 0.1 - 1.0 K/uL   Eosinophils Relative 2 0 - 5 %   Eosinophils Absolute 0.1 0.0 - 0.7 K/uL   Basophils Relative 0 0 - 1 %   Basophils Absolute 0.0 0.0 - 0.1 K/uL  Lipase, blood  Result Value Ref Range   Lipase 16 (L) 22 - 51 U/L  Urinalysis, Routine w reflex microscopic (not at [facility])  Result Value Ref Range   Color, Urine YELLOW YELLOW   APPearance HAZY (A) CLEAR   Specific Gravity, Urine 1.014 1.005 - 1.030   pH 5.0 5.0 - 8.0   Glucose, UA NEGATIVE NEGATIVE mg/dL   Hgb urine dipstick NEGATIVE NEGATIVE   Bilirubin Urine NEGATIVE NEGATIVE   Ketones, ur NEGATIVE NEGATIVE mg/dL   Protein, ur 100 (A) NEGATIVE  mg/dL   Urobilinogen, UA 0.2 0.0 - 1.0 mg/dL   Nitrite NEGATIVE NEGATIVE   Leukocytes, UA NEGATIVE NEGATIVE  Urine microscopic-add on  Result Value Ref Range   Squamous Epithelial / LPF FEW (A) RARE   WBC, UA 0-2 <3 WBC/hpf   Casts HYALINE CASTS (A) NEGATIVE   Urine-Other MUCOUS PRESENT   Dg Chest 2 View  03/10/2015   CLINICAL DATA:  Right upper chest pain for the past 3 days.  Chills.  EXAM: CHEST  2 VIEW  COMPARISON:  07/20/2014.  Chest CT dated 11/10/2014.  FINDINGS: Normal sized heart. Clear lungs. Stable prominence of the hilar vessels with no adenopathy seen on the previous CT. Previously demonstrated bilateral infrahilar bronchiectasis appears more prominent. No definite airspace consolidation. Unremarkable bones.  IMPRESSION: 1. Increased infrahilar bronchiectasis. 2. Stable changes of pulmonary arterial hypertension.  Electronically Signed   By: Claudie Revering M.D.   On: 03/10/2015 14:01   Ct Abdomen Pelvis W Contrast  03/10/2015   CLINICAL DATA:  Right upper quadrant abdominal pain for 2 days.  EXAM: CT ABDOMEN AND PELVIS WITH CONTRAST  TECHNIQUE: Multidetector CT imaging of the abdomen and pelvis was performed using the standard protocol following bolus administration of intravenous contrast.  CONTRAST:  116mL OMNIPAQUE IOHEXOL 300 MG/ML  SOLN  COMPARISON:  None.  FINDINGS: Lower chest: The lung bases are clear of acute process. Mild emphysematous changes are noted with lower lobe peribronchial thickening. Stable small pulmonary nodule in the left lower lobe. No pleural effusion. The heart is normal in size. There is a small hiatal hernia and mild distal esophageal dilatation.  Hepatobiliary: No focal hepatic lesions or intrahepatic biliary dilatation. The gallbladder is normal. No common bile duct dilatation.  Pancreas: Normal  Spleen: Normal  Adrenals/Urinary Tract: The adrenal glands and kidneys are unremarkable. No renal lesions or hydronephrosis. No findings for pyelonephritis.   Stomach/Bowel: The stomach is not well distended with contrast but no gross abnormalities are seen. The duodenum, small bowel and colon are grossly normal. No inflammatory changes, mass lesions or obstructive findings. The terminal ileum appears normal. The base of the appendix is slightly thickened measuring 8 mm but the remainder the appendix is normal and I do not see any inflammatory changes.  Vascular/Lymphatic: No mesenteric or retroperitoneal mass or adenopathy. Small scattered lymph nodes are noted. The aorta and branch vessels are normal.  Other: The bladder, prostate gland and seminal vesicles are unremarkable. No pelvic mass or lymphadenopathy. No free pelvic fluid collections. No inguinal mass or adenopathy. Small inguinal lymph nodes are noted.  Musculoskeletal: No significant bony findings.  IMPRESSION: No acute abdominal/ pelvic findings, mass lesions or lymphadenopathy.  Hiatal hernia and mild distal esophageal dilatation.   Electronically Signed   By: Marijo Sanes M.D.   On: 03/10/2015 18:09     BUN  Date Value Ref Range Status  03/10/2015 15 6 - 20 mg/dL Final  07/20/2014 13 6 - 23 mg/dL Final  01/18/2014 27* 6 - 23 mg/dL Final  01/14/2014 19 6 - 23 mg/dL Final   CREATININE, SER  Date Value Ref Range Status  03/10/2015 1.77* 0.61 - 1.24 mg/dL Final  07/20/2014 1.40* 0.50 - 1.35 mg/dL Final  01/18/2014 1.38* 0.50 - 1.35 mg/dL Final  01/14/2014 1.54* 0.50 - 1.35 mg/dL Final    7:30 PM Reevaluation with update and discussion. After initial assessment and treatment, an updated evaluation reveals  he states that he is more comfortable now. He does feel like the pain is coming back somewhat. He describes the pain as colicky. He also reports diarrhea which occurred today. Bryndle Corredor L   Assessment: Nonspecific abdominal pain, with reassuring findings. Chronic renal insufficiency, which is likely related to his lupus.  Plan: Home with fluids and Norco. F/U GI prn  Daleen Bo, MD 03/10/15 1932

## 2015-03-11 ENCOUNTER — Ambulatory Visit: Payer: Medicare Other | Admitting: Critical Care Medicine

## 2015-03-12 NOTE — Assessment & Plan Note (Signed)
Severe abdominal pain and R sided chest pain and symptoms c/w high level reflux and esophagitis, prior esoph stricture and esophagitis on prior egd Doubt primary pulm process Plan  BID PPI GI referral same day

## 2015-03-12 NOTE — Assessment & Plan Note (Signed)
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

## 2016-01-04 ENCOUNTER — Encounter: Payer: Self-pay | Admitting: Pulmonary Disease

## 2016-01-04 ENCOUNTER — Ambulatory Visit (INDEPENDENT_AMBULATORY_CARE_PROVIDER_SITE_OTHER): Payer: Medicare Other | Admitting: Pulmonary Disease

## 2016-01-04 VITALS — BP 116/84 | HR 96 | Ht 68.0 in | Wt 157.2 lb

## 2016-01-04 DIAGNOSIS — J479 Bronchiectasis, uncomplicated: Secondary | ICD-10-CM

## 2016-01-04 DIAGNOSIS — K2100 Gastro-esophageal reflux disease with esophagitis, without bleeding: Secondary | ICD-10-CM

## 2016-01-04 NOTE — Progress Notes (Signed)
Subjective:    Patient ID: Jorge [name], male    DOB: 08-Sep-1981, 35 y.o.   MRN: [id number]  HPI Follow-up for evaluation of asthma, bronchiectasis.  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 He is a former patient of Dr. Joya Gaskins. He also has a diagnosis of lupus which is manifested mainly as skin symptoms. He had been maintained on prednisone. This had been changed to Plaquenil for the past year. He is stable with respect to his pulmonary and his lupus symptoms.  He has history of significant GERD and esophageal strictures status post dilation. He had seen Dr. Benson Norway in the past. He feels that his symptoms are worsening and would like to have a reevaluation of this.  Social history: Nonsmoker, no alcohol, drug use. He is unemployed and on disability.  Family history: Brother-HIV Father-Lupus  Past Medical History  Diagnosis Date  . Lupus ([place])     "kidneys, lungs, heart"  . 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 History of pericarditis   . PAROTITIS, RIGHT 09/30/2009    Qualifier: Diagnosis of  By: Joya Gaskins MD, Burnett Harry   . Hypertension   . GERD (gastroesophageal reflux disease)   . Esophagitis 08/19/2012    EGD  . Stricture esophagus 08/19/2012    EGD  . Hiatal hernia 08/19/2012    EGD  . Asthma     stable no recent nebulizer use, uses Inhaler as needed    Current outpatient prescriptions:  .  albuterol (PROVENTIL HFA;VENTOLIN HFA) 108 (90 BASE) MCG/ACT inhaler, Inhale 2 puffs into the lungs every 4 (four) hours as needed for wheezing or shortness of breath., Disp: 1 Inhaler, Rfl: 3 .  amLODipine (NORVASC) 10 MG tablet, Take 10 mg by mouth every morning., Disp: , Rfl:  .  arformoterol (BROVANA) 15 MCG/2ML NEBU, Take 15 mcg by nebulization 2 (two) times daily., Disp: , Rfl:  .  HYDROcodone-acetaminophen (NORCO) 5-325 MG per tablet, Take 1 tablet by mouth every 4 (four) hours as needed., Disp: 20  tablet, Rfl: 0 .  hydroxychloroquine (PLAQUENIL) 200 MG tablet, Take 400 mg by mouth every morning., Disp: , Rfl:  .  omeprazole (PRILOSEC) 20 MG capsule, Take 1 capsule (20 mg total) by mouth 2 (two) times daily before a meal., Disp: 60 capsule, Rfl: 6 Review of Systems Denies any cough, sputum production, and dyspnea, wheezing, hemoptysis. Denies any nausea, vomiting, diarrhea, constipation. Denies any fevers, chills, loss of weight, loss of appetite. All other review of systems are negative    Objective:   Physical Exam Blood pressure 116/84, pulse 96, height 5\' 8"  (1.727 m), weight 157 lb 3.2 oz (71.305 kg), SpO2 100 %. Gen: No apparent distress Neuro: No gross focal deficits. Neck: No JVD, lymphadenopathy, thyromegaly. RS: Clear, No wheeze or crackles CVS: S1-S2 heard, no murmurs rubs gallops. Abdomen: Soft, positive bowel sounds. Extremities: No edema.    Assessment & Plan:  Broncho pulmonary dysplasia of prematurity Asthma Lupus  His resp symptoms are stable on Brovana nebulizer and albuterol. He has no new complaints at present. His chief complaint today are difficulty sleeping. We reviewed sleep hygiene and I have advised him to cut down on caffeine intake.  He also has severe GERD and esophageal strictures. He had been evaluated by Dr. Benson Norway in the past with esophageal dilatations. He reports difficulty swallowing and wants a referral back to Dr. Benson Norway.  Plan: - Continue Brovana and  albuterol - Referral to Dr. Benson Norway GI.  Marshell Garfinkel MD [facility] Pager [phone] If no answer or after 3pm call: [phone] 01/04/2016, 3:21 PM

## 2016-01-04 NOTE — Patient Instructions (Signed)
Please continue your brovana and Proventil inhaler. We will refer you to Dr. Benson Norway for reevaluation of her GI symptoms, GERD.  Return to clinic in 6 months

## 2016-01-05 ENCOUNTER — Telehealth: Payer: Self-pay | Admitting: Pulmonary Disease

## 2016-01-05 MED ORDER — ARFORMOTEROL TARTRATE 15 MCG/2ML IN NEBU: 15.0000 ug | INHALATION_SOLUTION | Freq: Two times a day (BID) | RESPIRATORY_TRACT | Status: DC

## 2016-01-05 MED ORDER — ALBUTEROL SULFATE HFA 108 (90 BASE) MCG/ACT IN AERS: 2.0000 | INHALATION_SPRAY | RESPIRATORY_TRACT | Status: DC | PRN

## 2016-01-05 MED ORDER — OMEPRAZOLE 20 MG PO CPDR: 20.0000 mg | DELAYED_RELEASE_CAPSULE | Freq: Two times a day (BID) | ORAL | Status: DC

## 2016-01-05 NOTE — Telephone Encounter (Signed)
Spoke with pt. He needs refills on Omeprazole, Brovana and Proventil HFA. These have been sent in. Nothing further was needed.

## 2016-01-05 NOTE — Telephone Encounter (Signed)
Pt calling stating that he is out of meds and needs them called asap said that he went by pham yesterday and they hadn't been called in yet, pt is no his way to pharm now please advise.Jorge [name]

## 2016-01-06 ENCOUNTER — Telehealth: Payer: Self-pay | Admitting: Pulmonary Disease

## 2016-01-06 MED ORDER — ARFORMOTEROL TARTRATE 15 MCG/2ML IN NEBU: 15.0000 ug | INHALATION_SOLUTION | Freq: Two times a day (BID) | RESPIRATORY_TRACT | Status: DC

## 2016-01-06 NOTE — Telephone Encounter (Signed)
Called [facility] and spoke with Sharyn Lull. Was advised they did receive RX for pt omeprazole and albuterol. We needed to resend RX for brovana with DX code on it. I have done so.  I called made pt aware. Nothing further needed

## 2016-01-10 ENCOUNTER — Other Ambulatory Visit: Payer: Self-pay | Admitting: Gastroenterology

## 2016-01-10 DIAGNOSIS — K449 Diaphragmatic hernia without obstruction or gangrene: Secondary | ICD-10-CM | POA: Diagnosis not present

## 2016-01-10 DIAGNOSIS — K219 Gastro-esophageal reflux disease without esophagitis: Secondary | ICD-10-CM | POA: Diagnosis not present

## 2016-01-10 DIAGNOSIS — K208 Other esophagitis: Secondary | ICD-10-CM | POA: Diagnosis not present

## 2016-01-18 ENCOUNTER — Encounter (HOSPITAL_COMMUNITY): Payer: Self-pay | Admitting: *Deleted

## 2016-01-27 NOTE — H&P (Signed)
  Jorge [name] HPI: His dysphagia resolved for four months after the 07/2014 EGD with dilation at 16.5 mm. He has a 5 cm hiatal hernia and during this time period he has taken his Nexium every morning before breakfast. Currently he states that he does not notice any significant benefit. Food is a delight to him, but he cannot enjoy food as he has severe dysphagia.   Past Medical History  Diagnosis Date  . Lupus ([place])     "kidneys, lungs, heart" 01-18-16 "no recent problems-no steroid use recently"  . 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 History of pericarditis   . PAROTITIS, RIGHT 09/30/2009    Qualifier: Diagnosis of  By: Joya Gaskins MD, Burnett Harry   . Hypertension   . GERD (gastroesophageal reflux disease)   . Esophagitis 08/19/2012    EGD  . Stricture esophagus 08/19/2012    EGD  . Hiatal hernia 08/19/2012    EGD  . Asthma     stable no recent nebulizer use, uses Inhaler as needed    Past Surgical History  Procedure Laterality Date  . Tonsillectomy    . Wisdom tooth extraction    . Esophagogastroduodenoscopy (egd) with propofol N/A 05/29/2014    Procedure: ESOPHAGOGASTRODUODENOSCOPY (EGD) WITH PROPOFOL;  Surgeon: Beryle Beams, MD;  Location: [facility];  Service: Endoscopy;  Laterality: N/A;  . Esophagogastroduodenoscopy (egd) with propofol N/A 07/24/2014    Procedure: ESOPHAGOGASTRODUODENOSCOPY (EGD) WITH PROPOFOL;  Surgeon: Beryle Beams, MD;  Location: [facility];  Service: Endoscopy;  Laterality: N/A;    Family History  Problem Relation Age of Onset  . Lupus Father   . HIV Brother   . Thyroid cancer Maternal Aunt     Social History:  reports that he has never smoked. He has never used smokeless tobacco. He reports that he drinks alcohol. He reports that he does not use illicit drugs.  Allergies: No Known Allergies  Medications: Scheduled: Continuous:  No results found for this or any previous visit (from the past 24 hour(s)).   No  results found.  ROS:  As stated above in the HPI otherwise negative.  There were no vitals taken for this visit.    PE: Gen: NAD, Alert and Oriented HEENT:  [place]/AT, EOMI Neck: Supple, no LAD Lungs: CTA Bilaterally CV: RRR without M/G/R ABM: Soft, NTND, +BS Ext: No C/C/E  Assessment/Plan: 1) History of an LA Grade D esophagitis - EGD.  Jorge [name] D 01/27/2016, 12:28 PM

## 2016-01-28 ENCOUNTER — Ambulatory Visit (HOSPITAL_COMMUNITY): Payer: Medicare Other | Admitting: Anesthesiology

## 2016-01-28 ENCOUNTER — Ambulatory Visit (HOSPITAL_COMMUNITY)
Admission: RE | Admit: 2016-01-28 | Discharge: 2016-01-28 | Disposition: A | Discharge status: Home / Self Care | Payer: Medicare Other | Source: Ambulatory Visit | Attending: Gastroenterology | Admitting: Gastroenterology

## 2016-01-28 ENCOUNTER — Encounter (HOSPITAL_COMMUNITY)
Admission: RE | Disposition: A | Discharge status: Home / Self Care | Payer: Self-pay | Source: Ambulatory Visit | Attending: Gastroenterology

## 2016-01-28 ENCOUNTER — Encounter (HOSPITAL_COMMUNITY): Payer: Self-pay | Admitting: *Deleted

## 2016-01-28 DIAGNOSIS — I10 Essential (primary) hypertension: Secondary | ICD-10-CM | POA: Diagnosis not present

## 2016-01-28 DIAGNOSIS — K210 Gastro-esophageal reflux disease with esophagitis: Secondary | ICD-10-CM | POA: Insufficient documentation

## 2016-01-28 DIAGNOSIS — K449 Diaphragmatic hernia without obstruction or gangrene: Secondary | ICD-10-CM | POA: Diagnosis not present

## 2016-01-28 DIAGNOSIS — K208 Other esophagitis: Secondary | ICD-10-CM | POA: Diagnosis not present

## 2016-01-28 DIAGNOSIS — R1310 Dysphagia, unspecified: Secondary | ICD-10-CM | POA: Insufficient documentation

## 2016-01-28 DIAGNOSIS — K219 Gastro-esophageal reflux disease without esophagitis: Secondary | ICD-10-CM | POA: Diagnosis not present

## 2016-01-28 HISTORY — PX: ESOPHAGOGASTRODUODENOSCOPY (EGD) WITH PROPOFOL: SHX5813

## 2016-01-28 SURGERY — ESOPHAGOGASTRODUODENOSCOPY (EGD) WITH PROPOFOL
Anesthesia: Monitor Anesthesia Care

## 2016-01-28 MED ORDER — SODIUM CHLORIDE 0.9 % IV SOLN: INTRAVENOUS | Status: DC

## 2016-01-28 MED ORDER — LIDOCAINE HCL (CARDIAC) 20 MG/ML IV SOLN
INTRAVENOUS | Status: DC | PRN
  Administered 2016-01-28: 100 mg via INTRAVENOUS

## 2016-01-28 MED ORDER — PROPOFOL 10 MG/ML IV BOLUS
INTRAVENOUS | Status: AC
  Filled 2016-01-28: qty 40

## 2016-01-28 MED ORDER — AMLODIPINE BESYLATE 5 MG PO TABS: 5.0000 mg | ORAL_TABLET | Freq: Every day | ORAL | Status: DC

## 2016-01-28 MED ORDER — MIDAZOLAM HCL 2 MG/2ML IJ SOLN
INTRAMUSCULAR | Status: AC
  Filled 2016-01-28: qty 2

## 2016-01-28 MED ORDER — ONDANSETRON HCL 4 MG/2ML IJ SOLN
4.0000 mg | Freq: Once | INTRAMUSCULAR | Status: AC
  Administered 2016-01-28: 4 mg via INTRAVENOUS

## 2016-01-28 MED ORDER — PROPOFOL 500 MG/50ML IV EMUL
INTRAVENOUS | Status: DC | PRN
  Administered 2016-01-28: 150 ug/kg/min via INTRAVENOUS

## 2016-01-28 MED ORDER — LIDOCAINE HCL (CARDIAC) 20 MG/ML IV SOLN
INTRAVENOUS | Status: AC
  Filled 2016-01-28: qty 5

## 2016-01-28 MED ORDER — LABETALOL HCL 5 MG/ML IV SOLN
5.0000 mg | Freq: Once | INTRAVENOUS | Status: AC
  Administered 2016-01-28: 5 mg via INTRAVENOUS
  Filled 2016-01-28: qty 4

## 2016-01-28 MED ORDER — ONDANSETRON HCL 4 MG/2ML IJ SOLN
INTRAMUSCULAR | Status: AC
  Filled 2016-01-28: qty 2

## 2016-01-28 MED ORDER — FENTANYL CITRATE (PF) 100 MCG/2ML IJ SOLN
INTRAMUSCULAR | Status: AC
  Filled 2016-01-28: qty 2

## 2016-01-28 MED ORDER — MIDAZOLAM HCL 5 MG/5ML IJ SOLN
INTRAMUSCULAR | Status: DC | PRN
  Administered 2016-01-28: 2 mg via INTRAVENOUS

## 2016-01-28 MED ORDER — FENTANYL CITRATE (PF) 100 MCG/2ML IJ SOLN
25.0000 ug | INTRAMUSCULAR | Status: DC | PRN
  Administered 2016-01-28: 25 ug via INTRAVENOUS

## 2016-01-28 MED ORDER — AMLODIPINE BESYLATE 10 MG PO TABS
10.0000 mg | ORAL_TABLET | Freq: Every day | ORAL | Status: DC
  Administered 2016-01-28: 10 mg via ORAL
  Filled 2016-01-28 (×2): qty 1

## 2016-01-28 MED ORDER — LACTATED RINGERS IV SOLN
INTRAVENOUS | Status: DC
Start: 2016-01-28 — End: 2016-01-28
  Administered 2016-01-28: 08:00:00 via INTRAVENOUS
  Administered 2016-01-28: 1000 mL via INTRAVENOUS

## 2016-01-28 MED ORDER — PROPOFOL 500 MG/50ML IV EMUL
INTRAVENOUS | Status: DC | PRN
  Administered 2016-01-28: 100 mg via INTRAVENOUS

## 2016-01-28 SURGICAL SUPPLY — 14 items

## 2016-01-28 NOTE — Progress Notes (Signed)
Dr. Eliseo Squires at bedside. Pt complaining of pain and nausea. zofran just given as ordered. Dr. Benson Norway to bedside to examine. Order for 25mg  Fentanyl per Dr. Eliseo Squires.

## 2016-01-28 NOTE — Progress Notes (Signed)
Pt waiting for ride. Pt sitting in recovery bay without complaint. Pt slowly eating saltine crackers and states feels better.

## 2016-01-28 NOTE — Addendum Note (Signed)
Addendum  created 01/28/16 1440 by Franne Grip, MD   Modules edited: Clinical Notes   Clinical Notes:  File: [id number]

## 2016-01-28 NOTE — Discharge Instructions (Signed)

## 2016-01-28 NOTE — Progress Notes (Signed)
Pt's ride is here. Pt states the crackers helped a lot and feels much better. No nausea or pain. Pt to be discharged

## 2016-01-28 NOTE — Progress Notes (Signed)
Called Dr. Delma Post and made aware that BP remains unchanged . Dr. Eliseo Squires order to give home dose of Norvasc before he leaves and he may be discharged.

## 2016-01-28 NOTE — Anesthesia Postprocedure Evaluation (Addendum)
Anesthesia Post Note  Patient: Jorge [name]  Procedure(s) Performed: Procedure(s) (LRB): ESOPHAGOGASTRODUODENOSCOPY (EGD) WITH PROPOFOL (N/A)  Patient location during evaluation: PACU Anesthesia Type: MAC Level of consciousness: awake and alert Pain management: pain level controlled Vital Signs Assessment: post-procedure vital signs reviewed and stable Respiratory status: spontaneous breathing, nonlabored ventilation, respiratory function stable and patient connected to nasal cannula oxygen Cardiovascular status: stable and blood pressure returned to baseline Anesthetic complications: no Comments: He was treated for nausea and pain in PACU. Dr. Benson Norway re-evaluated patient due to more pain with today's procedure compared to previous procedures. BP is elevated but he did not have his norvasc this AM. He was given his regular PO amlodipine 10mg  in PACU. BP is elevated but is at his baseline. No signs of toxic hypertension. No headaches, no visual disturbances. He wants to go home and will return for new or increased symptoms.    Last Vitals:  Filed Vitals:   01/28/16 0905 01/28/16 0910  BP: 165/110 170/116  Pulse: 96 97  Temp:    Resp: 12 17    Last Pain: There were no vitals filed for this visit.               [name]

## 2016-01-28 NOTE — Progress Notes (Signed)
Pt states he wants to take his blood pressure pill now. He states the nausea is better. 10mg  Norvasc given per order as home dose. Will continue to assess.

## 2016-01-28 NOTE — Op Note (Signed)
[facility] Patient Name: Jorge [name] Procedure Date: 01/28/2016 MRN: [id number] Attending MD: Carol Ada , MD Date of Birth: 1981/10/01 CSN:  Age: 35 Admit Type: Outpatient Procedure:                Upper GI endoscopy Indications:              Dysphagia, Reflux esophagitis Providers:                Carol Ada, MD, Dortha Schwalbe, RN, Elspeth Cho, Technician Referring MD:              Medicines:                Propofol per Anesthesia Complications:            No immediate complications. Estimated Blood Loss:     Estimated blood loss: none. Procedure:                Pre-Anesthesia Assessment:                           - Prior to the procedure, a History and Physical                            was performed, and patient medications and                            allergies were reviewed. The patient's tolerance of                            previous anesthesia was also reviewed. The risks                            and benefits of the procedure and the sedation                            options and risks were discussed with the patient.                            All questions were answered, and informed consent                            was obtained. Prior Anticoagulants: The patient has                            taken no previous anticoagulant or antiplatelet                            agents. ASA Grade Assessment: II - A patient with                            mild systemic disease. After reviewing the risks  and benefits, the patient was deemed in                            satisfactory condition to undergo the procedure.                           - Sedation was administered by an anesthesia                            professional. Deep sedation was attained.                           After obtaining informed consent, the endoscope was                            passed under direct vision.  Throughout the                            procedure, the patient's blood pressure, pulse, and                            oxygen saturations were monitored continuously. The                            Endoscope was introduced through the mouth, and                            advanced to the second part of duodenum. The upper                            GI endoscopy was accomplished without difficulty.                            The patient tolerated the procedure well. Scope In: Scope Out: Findings:      LA Grade A (one or more mucosal breaks less than 5 mm, not extending       between tops of 2 mucosal folds) esophagitis with no bleeding was found.       The esophagus was mildly stenosed, but much wider than the prior       endoscopic dilation.      A 4 cm hiatal hernia was present.      The stomach was normal.      The examined duodenum was normal.      The esophagitis is markedly improved compared to the initial examination. Impression:               - LA Grade A reflux esophagitis.                           - 4 cm hiatal hernia.                           - Normal stomach.                           - Normal examined duodenum.                           -  No specimens collected. Moderate Sedation:      N/A- Per Anesthesia Care Recommendation:           - Patient has a contact number available for                            emergencies. The signs and symptoms of potential                            delayed complications were discussed with the                            patient. Return to normal activities tomorrow.                            Written discharge instructions were provided to the                            patient.                           - Resume previous diet.                           - Continue present medications. Add sucralfate.                           - Follow up in 4 weeks. Procedure Code(s):        --- Professional ---                           [phone],  Esophagogastroduodenoscopy, flexible,                            transoral; diagnostic, including collection of                            specimen(s) by brushing or washing, when performed                            (separate procedure) Diagnosis Code(s):        --- Professional ---                           K21.0, Gastro-esophageal reflux disease with                            esophagitis                           K44.9, Diaphragmatic hernia without obstruction or                            gangrene                           R13.10, Dysphagia, unspecified CPT copyright 2016 American Medical Association. All rights reserved. The codes documented in this  report are preliminary and upon coder review may  be revised to meet current compliance requirements. Carol Ada, MD Carol Ada, MD 01/28/2016 8:56:11 AM This report has been signed electronically. Number of Addenda: 0

## 2016-01-28 NOTE — Progress Notes (Signed)
1015 zofran given as ordered IV. Pt still feeling nauseous. Will conitnue to assess and give PO norvasc when feeling better.

## 2016-01-28 NOTE — Transfer of Care (Signed)
Immediate Anesthesia Transfer of Care Note  Patient: Jorge [name]  Procedure(s) Performed: Procedure(s): ESOPHAGOGASTRODUODENOSCOPY (EGD) WITH PROPOFOL (N/A)  Patient Location: PACU  Anesthesia Type:MAC  Level of Consciousness:  sedated, patient cooperative and responds to stimulation  Airway & Oxygen Therapy:Patient Spontanous Breathing and Patient connected to face mask oxgen  Post-op Assessment:  Report given to PACU RN and Post -op Vital signs reviewed and stable  Post vital signs:  Reviewed and stable  Last Vitals:  Filed Vitals:   01/28/16 0641  BP: 181/129  Pulse: 95  Temp: 36.8 C  Resp: 17    Complications: No apparent anesthesia complications

## 2016-01-28 NOTE — Progress Notes (Signed)
Called and spoke to Dr. Eliseo Squires and told him patient took the Norvasc and is telling me his nausea is gone and his pain is much better and the patient states he wants to leave. No new orders at this time . Dr. Eliseo Squires states he will be over to see the pt.

## 2016-01-28 NOTE — Anesthesia Preprocedure Evaluation (Addendum)
Anesthesia Evaluation  Patient identified by MRN, date of birth, ID band Patient awake    Reviewed: Allergy & Precautions, NPO status , Patient's Chart, lab work & pertinent test results  Airway Mallampati: II  TM Distance: >3 FB Neck ROM: Full    Dental no notable dental hx.    Pulmonary asthma ,    Pulmonary exam normal breath sounds clear to auscultation       Cardiovascular hypertension, negative cardio ROS Normal cardiovascular exam Rhythm:Regular Rate:Normal     Neuro/Psych  Neuromuscular disease negative psych ROS   GI/Hepatic negative GI ROS, Neg liver ROS,   Endo/Other  negative endocrine ROS  Renal/GU negative Renal ROS  negative genitourinary   Musculoskeletal negative musculoskeletal ROS (+)   Abdominal   Peds negative pediatric ROS (+)  Hematology negative hematology ROS (+)   Anesthesia Other Findings   Reproductive/Obstetrics negative OB ROS                             Anesthesia Physical Anesthesia Plan  ASA: II  Anesthesia Plan: MAC   Post-op Pain Management:    Induction: Intravenous  Airway Management Planned: Natural Airway  Additional Equipment:   Intra-op Plan:   Post-operative Plan:   Informed Consent: I have reviewed the patients History and Physical, chart, labs and discussed the procedure including the risks, benefits and alternatives for the proposed anesthesia with the patient or authorized representative who has indicated his/her understanding and acceptance.   Dental advisory given  Plan Discussed with: CRNA  Anesthesia Plan Comments: (BP high but he did not take his medicine this AM. No headache, no visual disturbance. Will re-evaluate after propofol.)       Anesthesia Quick Evaluation

## 2016-01-28 NOTE — Progress Notes (Signed)
Dr. Eliseo Squires happened to be at bedside when pt vomited after drinking coke. Dr. Eliseo Squires order to give Zofran. And keep pt til he feels better.

## 2016-01-28 NOTE — Progress Notes (Signed)
Dr. Eliseo Squires to see pt. Pt without nausea or pain at this time. Pt states he feels better. Dr. Eliseo Squires gives the ok for patient to be discharge and tells patient that if he feels worse after he gets home to come back. Pt states understanding.

## 2016-01-28 NOTE — Progress Notes (Signed)
Dr. Delma Post made aware of bp 153/106. Order to give 5mg  of Labetolol IV. Will continue to assess and monitor

## 2016-02-01 ENCOUNTER — Encounter (HOSPITAL_COMMUNITY): Payer: Self-pay | Admitting: Gastroenterology

## 2016-02-28 DIAGNOSIS — K208 Other esophagitis: Secondary | ICD-10-CM | POA: Diagnosis not present

## 2016-02-28 DIAGNOSIS — K449 Diaphragmatic hernia without obstruction or gangrene: Secondary | ICD-10-CM | POA: Diagnosis not present

## 2016-02-28 DIAGNOSIS — K219 Gastro-esophageal reflux disease without esophagitis: Secondary | ICD-10-CM | POA: Diagnosis not present

## 2016-02-28 DIAGNOSIS — R21 Rash and other nonspecific skin eruption: Secondary | ICD-10-CM | POA: Diagnosis not present

## 2016-03-12 ENCOUNTER — Encounter (HOSPITAL_COMMUNITY): Payer: Self-pay

## 2016-03-12 ENCOUNTER — Emergency Department (HOSPITAL_COMMUNITY)
Admission: EM | Admit: 2016-03-12 | Discharge: 2016-03-12 | Disposition: A | Discharge status: Home / Self Care | Payer: Medicare Other | Attending: Emergency Medicine | Admitting: Emergency Medicine

## 2016-03-12 ENCOUNTER — Emergency Department (HOSPITAL_COMMUNITY): Payer: Medicare Other

## 2016-03-12 DIAGNOSIS — R059 Cough, unspecified: Secondary | ICD-10-CM

## 2016-03-12 DIAGNOSIS — R05 Cough: Secondary | ICD-10-CM | POA: Diagnosis not present

## 2016-03-12 DIAGNOSIS — R0602 Shortness of breath: Secondary | ICD-10-CM | POA: Diagnosis present

## 2016-03-12 DIAGNOSIS — J45901 Unspecified asthma with (acute) exacerbation: Secondary | ICD-10-CM

## 2016-03-12 DIAGNOSIS — J40 Bronchitis, not specified as acute or chronic: Secondary | ICD-10-CM

## 2016-03-12 DIAGNOSIS — I10 Essential (primary) hypertension: Secondary | ICD-10-CM | POA: Insufficient documentation

## 2016-03-12 MED ORDER — AZITHROMYCIN 250 MG PO TABS: ORAL_TABLET | ORAL | Status: DC

## 2016-03-12 MED ORDER — HYDROCODONE-ACETAMINOPHEN 5-325 MG PO TABS
2.0000 | ORAL_TABLET | Freq: Once | ORAL | Status: AC
  Administered 2016-03-12: 2 via ORAL
  Filled 2016-03-12: qty 2

## 2016-03-12 MED ORDER — PREDNISONE 20 MG PO TABS: 60.0000 mg | ORAL_TABLET | Freq: Every day | ORAL | Status: DC

## 2016-03-12 MED ORDER — ALBUTEROL SULFATE HFA 108 (90 BASE) MCG/ACT IN AERS: 2.0000 | INHALATION_SPRAY | RESPIRATORY_TRACT | Status: AC | PRN

## 2016-03-12 MED ORDER — ALBUTEROL SULFATE (2.5 MG/3ML) 0.083% IN NEBU
5.0000 mg | INHALATION_SOLUTION | Freq: Once | RESPIRATORY_TRACT | Status: AC
  Administered 2016-03-12: 5 mg via RESPIRATORY_TRACT
  Filled 2016-03-12: qty 6

## 2016-03-12 MED ORDER — PREDNISONE 20 MG PO TABS
60.0000 mg | ORAL_TABLET | Freq: Once | ORAL | Status: AC
  Administered 2016-03-12: 60 mg via ORAL
  Filled 2016-03-12: qty 3

## 2016-03-12 MED ORDER — IPRATROPIUM BROMIDE 0.02 % IN SOLN
0.5000 mg | Freq: Once | RESPIRATORY_TRACT | Status: AC
  Administered 2016-03-12: 0.5 mg via RESPIRATORY_TRACT
  Filled 2016-03-12: qty 2.5

## 2016-03-12 NOTE — Discharge Instructions (Signed)
It was our pleasure to provide your ER care today - we hope that you feel better.  Rest. Drink adequate fluids.  Take prednisone, and antibiotic, as prescribed.  Use inhaler as need.   Follow up with primary care doctor in the next few days for recheck if symptoms fail to improve/resolve.  Your blood pressure is mildly high today - follow up with your doctor in the next couple weeks.   Return to ER if worse, new symptoms, increased trouble breathing, other concern.      Asthma, Adult Asthma is a recurring condition in which the airways tighten and narrow. Asthma can make it difficult to breathe. It can cause coughing, wheezing, and shortness of breath. Asthma episodes, also called asthma attacks, range from minor to life-threatening. Asthma cannot be cured, but medicines and lifestyle changes can help control it. CAUSES Asthma is believed to be caused by inherited (genetic) and environmental factors, but its exact cause is unknown. Asthma may be triggered by allergens, lung infections, or irritants in the air. Asthma triggers are different for each person. Common triggers include:   Animal dander.  Dust mites.  Cockroaches.  Pollen from trees or grass.  Mold.  Smoke.  Air pollutants such as dust, household cleaners, hair sprays, aerosol sprays, paint fumes, strong chemicals, or strong odors.  Cold air, weather changes, and winds (which increase molds and pollens in the air).  Strong emotional expressions such as crying or laughing hard.  Stress.  Certain medicines (such as aspirin) or types of drugs (such as beta-blockers).  Sulfites in foods and drinks. Foods and drinks that may contain sulfites include dried fruit, potato chips, and sparkling grape juice.  Infections or inflammatory conditions such as the flu, a cold, or an inflammation of the nasal membranes (rhinitis).  Gastroesophageal reflux disease (GERD).  Exercise or strenuous activity. SYMPTOMS Symptoms  may occur immediately after asthma is triggered or many hours later. Symptoms include:  Wheezing.  Excessive nighttime or early morning coughing.  Frequent or severe coughing with a common cold.  Chest tightness.  Shortness of breath. DIAGNOSIS  The diagnosis of asthma is made by a review of your medical history and a physical exam. Tests may also be performed. These may include:  Lung function studies. These tests show how much air you breathe in and out.  Allergy tests.  Imaging tests such as X-rays. TREATMENT  Asthma cannot be cured, but it can usually be controlled. Treatment involves identifying and avoiding your asthma triggers. It also involves medicines. There are 2 classes of medicine used for asthma treatment:   Controller medicines. These prevent asthma symptoms from occurring. They are usually taken every day.  Reliever or rescue medicines. These quickly relieve asthma symptoms. They are used as needed and provide short-term relief. Your health care provider will help you create an asthma action plan. An asthma action plan is a written plan for managing and treating your asthma attacks. It includes a list of your asthma triggers and how they may be avoided. It also includes information on when medicines should be taken and when their dosage should be changed. An action plan may also involve the use of a device called a peak flow meter. A peak flow meter measures how well the lungs are working. It helps you monitor your condition. HOME CARE INSTRUCTIONS   Take medicines only as directed by your health care provider. Speak with your health care provider if you have questions about how or when to take  the medicines.  Use a peak flow meter as directed by your health care provider. Record and keep track of readings.  Understand and use the action plan to help minimize or stop an asthma attack without needing to seek medical care.  Control your home environment in the  following ways to help prevent asthma attacks:  Do not smoke. Avoid being exposed to secondhand smoke.  Change your heating and air conditioning filter regularly.  Limit your use of fireplaces and wood stoves.  Get rid of pests (such as roaches and mice) and their droppings.  Throw away plants if you see mold on them.  Clean your floors and dust regularly. Use unscented cleaning products.  Try to have someone else vacuum for you regularly. Stay out of rooms while they are being vacuumed and for a short while afterward. If you vacuum, use a dust mask from a hardware store, a double-layered or microfilter vacuum cleaner bag, or a vacuum cleaner with a HEPA filter.  Replace carpet with wood, tile, or vinyl flooring. Carpet can trap dander and dust.  Use allergy-proof pillows, mattress covers, and box spring covers.  Wash bed sheets and blankets every week in hot water and dry them in a dryer.  Use blankets that are made of polyester or cotton.  Clean bathrooms and kitchens with bleach. If possible, have someone repaint the walls in these rooms with mold-resistant paint. Keep out of the rooms that are being cleaned and painted.  Wash hands frequently. SEEK MEDICAL CARE IF:   You have wheezing, shortness of breath, or a cough even if taking medicine to prevent attacks.  The colored mucus you cough up (sputum) is thicker than usual.  Your sputum changes from clear or white to yellow, green, gray, or bloody.  You have any problems that may be related to the medicines you are taking (such as a rash, itching, swelling, or trouble breathing).  You are using a reliever medicine more than 2-3 times per week.  Your peak flow is still at 50-79% of your personal best after following your action plan for 1 hour.  You have a fever. SEEK IMMEDIATE MEDICAL CARE IF:   You seem to be getting worse and are unresponsive to treatment during an asthma attack.  You are short of breath even at  rest.  You get short of breath when doing very little physical activity.  You have difficulty eating, drinking, or talking due to asthma symptoms.  You develop chest pain.  You develop a fast heartbeat.  You have a bluish color to your lips or fingernails.  You are light-headed, dizzy, or faint.  Your peak flow is less than 50% of your personal best.   This information is not intended to replace advice given to you by your health care provider. Make sure you discuss any questions you have with your health care provider.   Document Released: 09/25/2005 Document Revised: 06/16/2015 Document Reviewed: 04/24/2013 Elsevier Interactive Patient Education 2016 Elsevier Inc.    Cough, Adult Coughing is a reflex that clears your throat and your airways. Coughing helps to heal and protect your lungs. It is normal to cough occasionally, but a cough that happens with other symptoms or lasts a long time may be a sign of a condition that needs treatment. A cough may last only 2-3 weeks (acute), or it may last longer than 8 weeks (chronic). CAUSES Coughing is commonly caused by:  Breathing in substances that irritate your lungs.  A viral  or bacterial respiratory infection.  Allergies.  Asthma.  Postnasal drip.  Smoking.  Acid backing up from the stomach into the esophagus (gastroesophageal reflux).  Certain medicines.  Chronic lung problems, including COPD (or rarely, lung cancer).  Other medical conditions such as heart failure. HOME CARE INSTRUCTIONS  Pay attention to any changes in your symptoms. Take these actions to help with your discomfort:  Take medicines only as told by your health care provider.  If you were prescribed an antibiotic medicine, take it as told by your health care provider. Do not stop taking the antibiotic even if you start to feel better.  Talk with your health care provider before you take a cough suppressant medicine.  Drink enough fluid to keep  your urine clear or pale yellow.  If the air is dry, use a cold steam vaporizer or humidifier in your bedroom or your home to help loosen secretions.  Avoid anything that causes you to cough at work or at home.  If your cough is worse at night, try sleeping in a semi-upright position.  Avoid cigarette smoke. If you smoke, quit smoking. If you need help quitting, ask your health care provider.  Avoid caffeine.  Avoid alcohol.  Rest as needed. SEEK MEDICAL CARE IF:   You have new symptoms.  You cough up pus.  Your cough does not get better after 2-3 weeks, or your cough gets worse.  You cannot control your cough with suppressant medicines and you are losing sleep.  You develop pain that is getting worse or pain that is not controlled with pain medicines.  You have a fever.  You have unexplained weight loss.  You have night sweats. SEEK IMMEDIATE MEDICAL CARE IF:  You cough up blood.  You have difficulty breathing.  Your heartbeat is very fast.   This information is not intended to replace advice given to you by your health care provider. Make sure you discuss any questions you have with your health care provider.   Document Released: 03/24/2011 Document Revised: 06/16/2015 Document Reviewed: 12/02/2014 Elsevier Interactive Patient Education [employer].

## 2016-03-12 NOTE — ED Notes (Addendum)
Pt started having shortness of breath with hoarseness yesterday.  Didn't sleep well last night. Woke up today with productive cough .  Chest pain with breathing and cough.  Pt using home nebs without relief.  Hx of asthma.

## 2016-03-12 NOTE — ED Provider Notes (Signed)
CSN: [id number]     Arrival date & time 03/12/16  1322 History   First MD Initiated Contact with Patient 03/12/16 1344     Chief Complaint  Patient presents with  . Shortness of Breath     (Consider location/radiation/quality/duration/timing/severity/associated sxs/prior Treatment) Patient is a 35 y.o. male presenting with shortness of breath. The history is provided by the patient.  Shortness of Breath Associated symptoms: cough and wheezing   Associated symptoms: no abdominal pain, no chest pain, no diaphoresis, no headaches, no neck pain, no rash, no sore throat and no vomiting   Patient with hx lupus, asthma, c/o increased prod cough, congestion, increased wheezing in the past couple days.  Symptoms constant, persistent, worse today. Tried home mdi w minimal relief.  t 99. No chills/sweats. Denies chest pain. No leg pain or swelling.      Past Medical History  Diagnosis Date  . Lupus ([place])     "kidneys, lungs, heart" 01-18-16 "no recent problems-no steroid use recently"  . 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 History of pericarditis   . PAROTITIS, RIGHT 09/30/2009    Qualifier: Diagnosis of  By: Joya Gaskins MD, Burnett Harry   . Hypertension   . GERD (gastroesophageal reflux disease)   . Esophagitis 08/19/2012    EGD  . Stricture esophagus 08/19/2012    EGD  . Hiatal hernia 08/19/2012    EGD  . Asthma     stable no recent nebulizer use, uses Inhaler as needed   Past Surgical History  Procedure Laterality Date  . Tonsillectomy    . Wisdom tooth extraction    . Esophagogastroduodenoscopy (egd) with propofol N/A 05/29/2014    Procedure: ESOPHAGOGASTRODUODENOSCOPY (EGD) WITH PROPOFOL;  Surgeon: Beryle Beams, MD;  Location: [facility];  Service: Endoscopy;  Laterality: N/A;  . Esophagogastroduodenoscopy (egd) with propofol N/A 07/24/2014    Procedure: ESOPHAGOGASTRODUODENOSCOPY (EGD) WITH PROPOFOL;  Surgeon: Beryle Beams, MD;  Location: [facility];   Service: Endoscopy;  Laterality: N/A;  . Esophagogastroduodenoscopy (egd) with propofol N/A 01/28/2016    Procedure: ESOPHAGOGASTRODUODENOSCOPY (EGD) WITH PROPOFOL;  Surgeon: Carol Ada, MD;  Location: [facility];  Service: Endoscopy;  Laterality: N/A;   Family History  Problem Relation Age of Onset  . Lupus Father   . HIV Brother   . Thyroid cancer Maternal Aunt    Social History  Substance Use Topics  . Smoking status: Never Smoker   . Smokeless tobacco: Never Used  . Alcohol Use: Yes     Comment: Occasional wine    Review of Systems  Constitutional: Negative for chills and diaphoresis.  HENT: Negative for sore throat.   Eyes: Negative for redness.  Respiratory: Positive for cough, shortness of breath and wheezing.   Cardiovascular: Negative for chest pain and leg swelling.  Gastrointestinal: Negative for vomiting and abdominal pain.  Genitourinary: Negative for flank pain.  Musculoskeletal: Negative for back pain and neck pain.  Skin: Negative for rash.  Neurological: Negative for headaches.  Hematological: Does not bruise/bleed easily.  Psychiatric/Behavioral: Negative for confusion.      Allergies  Review of patient's allergies indicates no known allergies.  Home Medications   Prior to Admission medications   Medication Sig Start Date End Date Taking? Authorizing Provider  albuterol (PROVENTIL HFA;VENTOLIN HFA) 108 (90 Base) MCG/ACT inhaler Inhale 2 puffs into the lungs every 4 (four) hours as needed for wheezing or shortness of breath. 01/05/16   Marshell Garfinkel, MD  amLODipine (  NORVASC) 10 MG tablet Take 10 mg by mouth every morning.    Historical Provider, MD  arformoterol (BROVANA) 15 MCG/2ML NEBU Take 2 mLs (15 mcg total) by nebulization 2 (two) times daily. DX code J47.9 01/06/16   Marshell Garfinkel, MD  hydroxychloroquine (PLAQUENIL) 200 MG tablet Take 400 mg by mouth every morning.    Historical Provider, MD  omeprazole (PRILOSEC) 20 MG capsule Take 1 capsule  (20 mg total) by mouth 2 (two) times daily before a meal. 01/05/16   Praveen Mannam, MD   BP 153/94 mmHg  Pulse 102  Temp(Src) 99 F (37.2 C) (Oral)  Resp 24  SpO2 98% Physical Exam  Constitutional: He appears well-developed and well-nourished. No distress.  HENT:  Mouth/Throat: Oropharynx is clear and moist.  Eyes: Conjunctivae are normal. No scleral icterus.  Neck: Neck supple. No tracheal deviation present.  Cardiovascular: Normal rate, regular rhythm, normal heart sounds and intact distal pulses.  Exam reveals no gallop and no friction rub.   No murmur heard. Pulmonary/Chest: Effort normal. No accessory muscle usage. No respiratory distress. He has wheezes. He exhibits tenderness.  Chest wall tenderness reproducing symptoms,  No crepitus.   Abdominal: Soft. Bowel sounds are normal. He exhibits no distension. There is no tenderness.  Musculoskeletal: Normal range of motion. He exhibits no edema or tenderness.  Neurological: He is alert.  Skin: Skin is warm and dry. He is not diaphoretic.  Psychiatric: He has a normal mood and affect.  Nursing note and vitals reviewed.   ED Course  Procedures (including critical care time)  Imaging Review Dg Chest 2 View  03/12/2016  CLINICAL DATA:  Cough and congestion with shortness of breath for 2 days EXAM: CHEST  2 VIEW COMPARISON:  March 10, 2015 chest radiograph and chest CT November 10, 2014 FINDINGS: There is no edema or consolidation. Chronic central peribronchial thickening noted. The heart size is normal. Prominence of the central pulmonary arteries with rapid peripheral tapering is indicative of a degree of pulmonary arterial hypertension. No adenopathy is evident. No bone lesions. IMPRESSION: Findings indicative of a degree of chronic hypertension. No edema or consolidation. There is central peribronchial thickening consistent with chronic bronchitis. Electronically Signed   By: Lowella Grip III M.D.   On: 03/12/2016 13:49   I have  personally reviewed and evaluated these images as part of my medical decision-making.   MDM   Albuterol neb.  pred po.  Additional alb and atrovent neb.  With prod cough, hx asthma/chronic lung disease, will also rx abx.  Reviewed nursing notes and prior charts for additional history.   Pt requests pain med.  Has ride, does not have to drive.  Hydrocodone po.  Recheck, good air exchange. Wheezing improved.   Patient currently appears stable for d/c.        Lajean Saver, MD 03/12/16 1511

## 2016-03-12 NOTE — ED Notes (Signed)
MD at bedside. 

## 2016-03-20 ENCOUNTER — Ambulatory Visit (HOSPITAL_COMMUNITY)
Admission: RE | Admit: 2016-03-20 | Discharge: 2016-03-20 | Disposition: A | Discharge status: Home / Self Care | Payer: Medicare Other | Source: Ambulatory Visit | Attending: Gastroenterology | Admitting: Gastroenterology

## 2016-03-20 ENCOUNTER — Encounter (HOSPITAL_COMMUNITY)
Admission: RE | Disposition: A | Discharge status: Home / Self Care | Payer: Self-pay | Source: Ambulatory Visit | Attending: Gastroenterology

## 2016-03-20 DIAGNOSIS — R12 Heartburn: Secondary | ICD-10-CM | POA: Diagnosis not present

## 2016-03-20 DIAGNOSIS — R1310 Dysphagia, unspecified: Secondary | ICD-10-CM | POA: Insufficient documentation

## 2016-03-20 DIAGNOSIS — K219 Gastro-esophageal reflux disease without esophagitis: Secondary | ICD-10-CM | POA: Diagnosis not present

## 2016-03-20 DIAGNOSIS — K208 Other esophagitis: Secondary | ICD-10-CM | POA: Diagnosis not present

## 2016-03-20 HISTORY — PX: ESOPHAGEAL MANOMETRY: SHX5429

## 2016-03-20 HISTORY — PX: PH IMPEDANCE STUDY: SHX5565

## 2016-03-20 HISTORY — PX: 24 HOUR PH STUDY: SHX5419

## 2016-03-20 SURGERY — MONITORING, ESOPHAGEAL PH, 24 HOUR

## 2016-03-20 MED ORDER — LIDOCAINE VISCOUS 2 % MT SOLN
OROMUCOSAL | Status: AC
  Filled 2016-03-20: qty 15

## 2016-03-20 SURGICAL SUPPLY — 2 items
FACESHIELD LNG OPTICON STERILE (SAFETY) IMPLANT
GLOVE BIO SURGEON STRL SZ8 (GLOVE) ×6 IMPLANT

## 2016-03-20 NOTE — Progress Notes (Signed)
Esophageal Manometry done per protocol. Pt tolerated study well with no complications. 24 Hour PH probe inserted per protocol. Pt tolerated well without complications. Pt instructed on use of PH monitor using teachback. Pt stated he understood and would return at or after 0900 on 03/21/2016 to have probe removed and study read. Reports will be sent to Dr. Benson Norway when completed.

## 2016-03-21 ENCOUNTER — Encounter (HOSPITAL_COMMUNITY): Payer: Self-pay | Admitting: Gastroenterology

## 2016-03-29 ENCOUNTER — Encounter (HOSPITAL_COMMUNITY): Payer: Self-pay | Admitting: *Deleted

## 2016-03-29 ENCOUNTER — Emergency Department (HOSPITAL_COMMUNITY)
Admission: EM | Admit: 2016-03-29 | Discharge: 2016-03-29 | Disposition: A | Discharge status: Home / Self Care | Payer: Medicare Other | Attending: Dermatology | Admitting: Dermatology

## 2016-03-29 ENCOUNTER — Encounter (HOSPITAL_COMMUNITY): Payer: Self-pay | Admitting: Emergency Medicine

## 2016-03-29 ENCOUNTER — Ambulatory Visit (HOSPITAL_COMMUNITY)
Admission: EM | Admit: 2016-03-29 | Discharge: 2016-03-29 | Disposition: A | Discharge status: Home / Self Care | Payer: Medicare Other | Attending: Emergency Medicine | Admitting: Emergency Medicine

## 2016-03-29 DIAGNOSIS — M25561 Pain in right knee: Secondary | ICD-10-CM

## 2016-03-29 DIAGNOSIS — J45909 Unspecified asthma, uncomplicated: Secondary | ICD-10-CM | POA: Diagnosis not present

## 2016-03-29 DIAGNOSIS — Z79899 Other long term (current) drug therapy: Secondary | ICD-10-CM | POA: Insufficient documentation

## 2016-03-29 DIAGNOSIS — L930 Discoid lupus erythematosus: Secondary | ICD-10-CM | POA: Diagnosis not present

## 2016-03-29 DIAGNOSIS — M25562 Pain in left knee: Secondary | ICD-10-CM

## 2016-03-29 DIAGNOSIS — M170 Bilateral primary osteoarthritis of knee: Secondary | ICD-10-CM

## 2016-03-29 DIAGNOSIS — Z5321 Procedure and treatment not carried out due to patient leaving prior to being seen by health care provider: Secondary | ICD-10-CM | POA: Diagnosis not present

## 2016-03-29 DIAGNOSIS — I10 Essential (primary) hypertension: Secondary | ICD-10-CM | POA: Insufficient documentation

## 2016-03-29 DIAGNOSIS — M25461 Effusion, right knee: Secondary | ICD-10-CM | POA: Insufficient documentation

## 2016-03-29 DIAGNOSIS — M129 Arthropathy, unspecified: Secondary | ICD-10-CM | POA: Diagnosis not present

## 2016-03-29 LAB — CBC
HEMATOCRIT: 36.1 % — AB (ref 39.0–52.0)
Hemoglobin: 11.4 g/dL — ABNORMAL LOW (ref 13.0–17.0)
MCH: 24.1 pg — AB (ref 26.0–34.0)
MCHC: 31.6 g/dL (ref 30.0–36.0)
MCV: 76.3 fL — AB (ref 78.0–100.0)
Platelets: 371 10*3/uL (ref 150–400)
RBC: 4.73 MIL/uL (ref 4.22–5.81)
RDW: 14.8 % (ref 11.5–15.5)
WBC: 6.1 10*3/uL (ref 4.0–10.5)

## 2016-03-29 LAB — BASIC METABOLIC PANEL
ANION GAP: 6 (ref 5–15)
BUN: 24 mg/dL — ABNORMAL HIGH (ref 6–20)
CALCIUM: 8.6 mg/dL — AB (ref 8.9–10.3)
CO2: 23 mmol/L (ref 22–32)
Chloride: 106 mmol/L (ref 101–111)
Creatinine, Ser: 1.87 mg/dL — ABNORMAL HIGH (ref 0.61–1.24)
GFR calc Af Amer: 53 mL/min — ABNORMAL LOW (ref >60)
GFR calc non Af Amer: 46 mL/min — ABNORMAL LOW (ref >60)
GLUCOSE: 85 mg/dL (ref 65–99)
Potassium: 4.5 mmol/L (ref 3.5–5.1)
Sodium: 135 mmol/L (ref 135–145)

## 2016-03-29 MED ORDER — OXYCODONE-ACETAMINOPHEN 5-325 MG PO TABS: 2.0000 | ORAL_TABLET | ORAL | Status: DC | PRN

## 2016-03-29 NOTE — ED Notes (Signed)
C/o bilateral knee pain and swelling since yesterday States he woke up in pain yesterday morning

## 2016-03-29 NOTE — ED Notes (Signed)
Patient approached registration and stated he was leaving.

## 2016-03-29 NOTE — ED Provider Notes (Signed)
CSN: [id number]     Arrival date & time 03/29/16  1308 History   First MD Initiated Contact with Patient 03/29/16 1335     Chief Complaint  Patient presents with  . Joint Swelling  . Knee Pain   (Consider location/radiation/quality/duration/timing/severity/associated sxs/prior Treatment) HPI Comments: 35 year old male with a history of lupus ([place]), bronchopulmonary dysplasia, hypertension, severe GERD with esophageal involvement, esophageal strictures, hiatal hernia and asthma states that he awoke yesterday with bilateral knee pain and swelling. Has been getting worse over the past 24 hours. He has no history of this type of knee pain or swelling. He states he is barely able to bear weight due to the pain. Denies any known injury. He states the pain is diffuse. For the right knee the pain seems to be worse in the posterior aspect however there is much tenderness anteriorly and to the medial and lateral aspects. Both knees are affected and painful.  He is uncertain of the medication he is taking. He believes he is taking Plaquenil he does not know what the other 3 medications are. He states they are not listed on the chart because he does not know the names.     Past Medical History  Diagnosis Date  . Lupus ([place])     "kidneys, lungs, heart" 01-18-16 "no recent problems-no steroid use recently"  . 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 History of pericarditis   . PAROTITIS, RIGHT 09/30/2009    Qualifier: Diagnosis of  By: Joya Gaskins MD, Burnett Harry   . Hypertension   . GERD (gastroesophageal reflux disease)   . Esophagitis 08/19/2012    EGD  . Stricture esophagus 08/19/2012    EGD  . Hiatal hernia 08/19/2012    EGD  . Asthma     stable no recent nebulizer use, uses Inhaler as needed   Past Surgical History  Procedure Laterality Date  . Tonsillectomy    . Wisdom tooth extraction    . Esophagogastroduodenoscopy (egd) with propofol N/A 05/29/2014    Procedure:  ESOPHAGOGASTRODUODENOSCOPY (EGD) WITH PROPOFOL;  Surgeon: Beryle Beams, MD;  Location: [facility];  Service: Endoscopy;  Laterality: N/A;  . Esophagogastroduodenoscopy (egd) with propofol N/A 07/24/2014    Procedure: ESOPHAGOGASTRODUODENOSCOPY (EGD) WITH PROPOFOL;  Surgeon: Beryle Beams, MD;  Location: [facility];  Service: Endoscopy;  Laterality: N/A;  . Esophagogastroduodenoscopy (egd) with propofol N/A 01/28/2016    Procedure: ESOPHAGOGASTRODUODENOSCOPY (EGD) WITH PROPOFOL;  Surgeon: Carol Ada, MD;  Location: [facility];  Service: Endoscopy;  Laterality: N/A;  . 24 hour ph study N/A 03/20/2016    Procedure: [place] STUDY;  Surgeon: Carol Ada, MD;  Location: [facility];  Service: Endoscopy;  Laterality: N/A;  . Ph impedance study N/A 03/20/2016    Procedure: [place] IMPEDANCE STUDY;  Surgeon: Carol Ada, MD;  Location: [facility];  Service: Endoscopy;  Laterality: N/A;  . Esophageal manometry N/A 03/20/2016    Procedure: ESOPHAGEAL MANOMETRY (EM);  Surgeon: Carol Ada, MD;  Location: [facility];  Service: Endoscopy;  Laterality: N/A;   Family History  Problem Relation Age of Onset  . Lupus Father   . HIV Brother   . Thyroid cancer Maternal Aunt    Social History  Substance Use Topics  . Smoking status: Never Smoker   . Smokeless tobacco: Never Used  . Alcohol Use: Yes     Comment: Occasional wine    Review of Systems  Constitutional: Positive for activity change. Negative for  fever and appetite change.  HENT: Negative.   Respiratory:       States he has chronic respiratory problems with wheezing due to his lupus and bronchopulmonary dysplasia but that is not a specific problem for him today.  Cardiovascular: Negative for chest pain.  Gastrointestinal: Negative for abdominal pain.  Genitourinary: Negative.   Musculoskeletal: Positive for joint swelling and arthralgias. Negative for myalgias, back pain, neck pain and neck stiffness.  Skin: Positive for rash.  Negative for color change.       Psoriatic rash scattered mowing the extremities.  All other systems reviewed and are negative.   Allergies  Review of patient's allergies indicates no known allergies.  Home Medications   Prior to Admission medications   Medication Sig Start Date End Date Taking? Authorizing Provider  albuterol (PROVENTIL HFA;VENTOLIN HFA) 108 (90 Base) MCG/ACT inhaler Inhale 2 puffs into the lungs every 4 (four) hours as needed for wheezing or shortness of breath. 01/05/16   Praveen Mannam, MD  albuterol (PROVENTIL HFA;VENTOLIN HFA) 108 (90 Base) MCG/ACT inhaler Inhale 2 puffs into the lungs every 4 (four) hours as needed for wheezing or shortness of breath. 03/12/16   Lajean Saver, MD  amLODipine (NORVASC) 10 MG tablet Take 10 mg by mouth every morning.    Historical Provider, MD  arformoterol (BROVANA) 15 MCG/2ML NEBU Take 2 mLs (15 mcg total) by nebulization 2 (two) times daily. DX code J47.9 01/06/16   Praveen Mannam, MD  azithromycin (ZITHROMAX Z-PAK) 250 MG tablet Take as directed.  Take two (2) tablets today, then one (1) tablet a day for the next 4 days. 03/12/16   Lajean Saver, MD  hydroxychloroquine (PLAQUENIL) 200 MG tablet Take 400 mg by mouth every morning.    Historical Provider, MD  omeprazole (PRILOSEC) 20 MG capsule Take 1 capsule (20 mg total) by mouth 2 (two) times daily before a meal. 01/05/16   Praveen Mannam, MD  predniSONE (DELTASONE) 20 MG tablet Take 3 tablets (60 mg total) by mouth daily. 03/12/16   Lajean Saver, MD   Meds Ordered and Administered this Visit  Medications - No data to display  BP 149/103 mmHg  Pulse 103  Temp(Src) 98.1 F (36.7 C) (Oral)  Resp 16  SpO2 98% No data found.   Physical Exam  Constitutional: He is oriented to person, place, and time. He appears well-developed and well-nourished.  Patient is in obvious pain to his knees.  Neck: Normal range of motion. Neck supple.  Cardiovascular: Normal rate.   Pulmonary/Chest: Effort  normal.  Musculoskeletal: He exhibits edema and tenderness.  Bilateral knees with mild-to-moderate swelling. Tenderness to nearly every aspect of both knees. He is able to flex the right knee to 90 but with some pain. There is no erythema, increased warmth, deformity or other discoloration. No evidence of infection. No cellulitis.  Neurological: He is alert and oriented to person, place, and time. He exhibits normal muscle tone.  Skin: Skin is warm and dry. Rash noted.  Psoriasis lesions to the lower extremities.  Nursing note and vitals reviewed.   ED Course  Procedures (including critical care time)  Labs Review Labs Reviewed - No data to display  Imaging Review No results found.   Visual Acuity Review  Right Eye Distance:   Left Eye Distance:   Bilateral Distance:    Right Eye Near:   Left Eye Near:    Bilateral Near:         MDM   1. Bilateral knee pain  2. LE (lupus erythematosus)   3. Arthralgia of both knees   4. Arthritis of both knees    Systemic Lupus Erythematosus, Adult The most likely explanation of your knee pain and swelling is due to arthritis associated with your lupus. This is not an uncommon occurrence with SLE. For now we will treat with opioids. Due to the esophageal and other stomach problems according to your chart NSAIDs and steroids by mouth may possibly cause more problems and bleeding in abrasion of the lining of your esophagus and stomach. Call your lupus doctor today to get an appointment as soon as possible. Meds ordered this encounter  Medications  . oxyCODONE-acetaminophen (PERCOCET/ROXICET) 5-325 MG tablet    Sig: Take 2 tablets by mouth every 4 (four) hours as needed for severe pain.    Dispense:  10 tablet    Refill:  0    Order Specific Question:  Supervising Provider    Answer:  Carmela Hurt       Janne Napoleon, NP 03/29/16 1414  Janne Napoleon, NP 03/29/16 1418

## 2016-03-29 NOTE — Discharge Instructions (Signed)
Systemic Lupus Erythematosus, Adult The most likely explanation of your knee pain and swelling is due to arthritis associated with your lupus. This is not an uncommon occurrence with SLE. For now we will treat with opioids. Due to the esophageal and other stomach problems according to your chart NSAIDs and steroids by mouth may possibly cause more problems and bleeding in abrasion of the lining of your esophagus and stomach. Call your lupus doctor today to get an appointment as soon as possible. Systemic lupus erythematosus is a long-term (chronic) disease that can affect many parts of the body. It can damage the skin, joints, blood vessels, brain, kidneys, lungs, heart, and other internal organs. It causes pain, irritation, and inflammation. Systemic lupus erythematosus is an autoimmune disease. With this type of disease, the body's defense system (immune system) mistakenly attacks normal tissues instead of attacking germs or abnormal growths. CAUSES The cause of this condition is not known. RISK FACTORS This condition is more likely to develop in:  Females.  People of Asian descent.  People of African-American descent.  People who have a family history of the condition. SYMPTOMS General symptoms include:  Joint pain and swelling (common).  Fever.  Fatigue.  Unusual weight loss or weight gain.  Skin rashes, especially over the nose and cheeks (butterfly rash) and after sun exposure.  Sores inside the mouth or nose. Other symptoms depend on which parts of the body are affected. They can include:  Shortness of breath.  Chest pain.  Frequent urination.  Blood in the urine.  Seizures.  Mental changes.  Hair loss.  Swollen and tender lymph nodes.  Swelling of the hands or feet. Symptoms can come and go. A period of time when symptoms get worse or come back is called a flare. A period of time with no symptoms is called a remission. DIAGNOSIS This condition is diagnosed  based on symptoms, a medical history, and a physical exam. You may also have tests, including:  Blood tests.  Urine tests.  A chest X-ray.  A skin or kidney biopsy. For this test, a sample of tissue is taken from the skin or kidney and studied under a microscope. You may be referred to an autoimmune disease specialist (rheumatologist). TREATMENT There is no cure for this condition, but treatment can keep the disease in remission, help to control symptoms, and prevent damage to the heart, lungs, kidneys, and other organs. Treatment may involve taking a combination of medicines over time. HOME CARE INSTRUCTIONS Medicines  Take medicines only as directed by your health care provider.  Do not take any medicines that contain estrogen without first checking with your health care provider. Estrogen can trigger flares and may increase your risk for blood clots. Lifestyle  Eat a heart-healthy diet.  Stay active as directed by your health care provider.  Do not smoke. If you need help quitting, ask your health care provider.  Protect your skin from the sun by applying sunblock and wearing protective hats and clothing.  Learn as much as you can about your condition and have a good support system in place. Support may come from family, friends, or a lupus support group. General Instructions  Keep all follow-up visits as directed by your health care provider. This is important.  Work closely with all of your health care providers to manage your condition.  Let your health care provider know right away if you become pregnant or if you plan to become pregnant. Pregnancy in women with this condition  is considered high risk. SEEK MEDICAL CARE IF:  You have a fever.  Your symptoms flare.  You develop new symptoms.  You develop swollen feet or hands.  You develop puffiness around your eyes.  Your medicines are not working.  You have bloody, foamy, or coffee-colored urine.  There are  changes in your urination. For example, you urinate more often at night.  You think that you may be depressed or have anxiety. SEEK IMMEDIATE MEDICAL CARE IF:  You have chest pain.  You have trouble breathing.  You have a seizure.  You suddenly get a very bad headache.  You suddenly develop facial or body weakness.  You cannot speak.  You cannot understand speech.   This information is not intended to replace advice given to you by your health care provider. Make sure you discuss any questions you have with your health care provider.   Document Released: 09/15/2002 Document Revised: 02/09/2015 Document Reviewed: 09/02/2014 Elsevier Interactive Patient Education [employer].

## 2016-03-29 NOTE — ED Notes (Signed)
Patient has had bilateral knee pain and swelling x2 days.  Patient denies trauma to knees.  Patient states he has Lupus and sought treatment at Urgent Care today.  They gave him Vicodin for pain and directed him to the ED.  Patient has bilateral knee swelling.  Patient made an appointment with [facility] for his lupus on Monday, but could not get in earlier to see them.  Patient denies SOB, chest pain, difficulty urinating, fever and N/V.

## 2016-04-03 ENCOUNTER — Ambulatory Visit: Payer: Medicare Other | Admitting: Pulmonary Disease

## 2016-04-12 DIAGNOSIS — K224 Dyskinesia of esophagus: Secondary | ICD-10-CM | POA: Diagnosis not present

## 2016-04-12 DIAGNOSIS — K449 Diaphragmatic hernia without obstruction or gangrene: Secondary | ICD-10-CM | POA: Diagnosis not present

## 2016-07-13 ENCOUNTER — Ambulatory Visit: Payer: Medicare Other | Admitting: Pulmonary Disease

## 2017-04-26 ENCOUNTER — Ambulatory Visit (HOSPITAL_COMMUNITY)
Admission: EM | Admit: 2017-04-26 | Discharge: 2017-04-26 | Disposition: A | Discharge status: Home / Self Care | Payer: Medicare Other | Attending: Family Medicine | Admitting: Family Medicine

## 2017-04-26 ENCOUNTER — Encounter (HOSPITAL_COMMUNITY): Payer: Self-pay | Admitting: Emergency Medicine

## 2017-04-26 DIAGNOSIS — S99921A Unspecified injury of right foot, initial encounter: Secondary | ICD-10-CM | POA: Diagnosis not present

## 2017-04-26 NOTE — ED Notes (Signed)
Med  Post  [employer]

## 2017-04-26 NOTE — Discharge Instructions (Addendum)
Today you were diagnosed with the following: 1. Injury of second toe, right, initial encounter    I suspect you have broken this toe. Wear the cast shoe until pain goes away. You may use over the counter ibuprofen or acetaminophen as needed.  You have not been prescribed prescription medications this visit.   If you are not improving over the next few days or feel you are worsening please follow up here or the Emergency Department if you are unable to see your regular doctor.

## 2017-04-26 NOTE — ED Provider Notes (Signed)
  [place]   [id number] 04/26/17 Arrival Time: 56  ASSESSMENT & PLAN:  Today you were diagnosed with the following: 1. Injury of second toe, right, initial encounter    I suspect you have broken this toe. Wear the cast shoe until pain goes away. You may use over the counter ibuprofen or acetaminophen as needed.  You have not been prescribed prescription medications this visit.   If you are not improving over the next few days or feel you are worsening please follow up here or the Emergency Department if you are unable to see your regular doctor.  No imaging performed.  Reviewed expectations re: course of current medical issues. Questions answered. Outlined signs and symptoms indicating need for more acute intervention. Patient verbalized understanding. After Visit Summary given.   SUBJECTIVE:  Jorge [name] is a 36 y.o. male who presents with complaint of R second toe pain starting after he dropped a heavy piece of furniture on it yesterday. Throbbing today. No analgesics taken. Able to bear weight as walk as normally. No sensation changes reported. No open wounds. No previous injury to toe.  Notice increased BP. Has been told in the past he has HTN.  ROS: As per HPI.   OBJECTIVE:  Vitals:   04/26/17 1106 04/26/17 1107  BP:  (!) 153/113  Pulse:  97  Resp:  16  Temp:  98.3 F (36.8 C)  TempSrc:  Oral  SpO2:  97%  Weight: 160 lb (72.6 kg)   Height: 5\' 8"  (1.727 m)      General appearance: alert; no distress Extremities: R second toe with distal tenderness; no gross deformity; FROM with discomfort; sensation intact; thickened nail Skin: dry  No Known Allergies  PMHx, SurgHx, SocialHx, Medications, and Allergies were reviewed in the Visit Navigator and updated as appropriate.      Vanessa Kick, MD 04/26/17 1126

## 2017-04-26 NOTE — ED Triage Notes (Signed)
PT reports he dropped a piece of furniture on right foot yesterday. PT reports pain is located over second toe.

## 2017-09-08 DIAGNOSIS — R229 Localized swelling, mass and lump, unspecified: Secondary | ICD-10-CM

## 2017-09-08 HISTORY — DX: Localized swelling, mass and lump, unspecified: R22.9

## 2017-09-14 ENCOUNTER — Other Ambulatory Visit: Payer: Self-pay

## 2017-09-14 ENCOUNTER — Encounter (HOSPITAL_COMMUNITY): Payer: Self-pay

## 2017-09-14 ENCOUNTER — Emergency Department (HOSPITAL_COMMUNITY)
Admission: EM | Admit: 2017-09-14 | Discharge: 2017-09-14 | Disposition: A | Discharge status: Home / Self Care | Payer: Medicare Other | Attending: Emergency Medicine | Admitting: Emergency Medicine

## 2017-09-14 DIAGNOSIS — I129 Hypertensive chronic kidney disease with stage 1 through stage 4 chronic kidney disease, or unspecified chronic kidney disease: Secondary | ICD-10-CM | POA: Insufficient documentation

## 2017-09-14 DIAGNOSIS — Z79899 Other long term (current) drug therapy: Secondary | ICD-10-CM | POA: Diagnosis not present

## 2017-09-14 DIAGNOSIS — N183 Chronic kidney disease, stage 3 unspecified: Secondary | ICD-10-CM

## 2017-09-14 DIAGNOSIS — N189 Chronic kidney disease, unspecified: Secondary | ICD-10-CM | POA: Diagnosis not present

## 2017-09-14 DIAGNOSIS — L989 Disorder of the skin and subcutaneous tissue, unspecified: Secondary | ICD-10-CM | POA: Diagnosis not present

## 2017-09-14 DIAGNOSIS — L729 Follicular cyst of the skin and subcutaneous tissue, unspecified: Secondary | ICD-10-CM | POA: Insufficient documentation

## 2017-09-14 DIAGNOSIS — J45909 Unspecified asthma, uncomplicated: Secondary | ICD-10-CM | POA: Diagnosis not present

## 2017-09-14 DIAGNOSIS — R112 Nausea with vomiting, unspecified: Secondary | ICD-10-CM | POA: Diagnosis present

## 2017-09-14 DIAGNOSIS — R05 Cough: Secondary | ICD-10-CM | POA: Insufficient documentation

## 2017-09-14 DIAGNOSIS — I10 Essential (primary) hypertension: Secondary | ICD-10-CM

## 2017-09-14 LAB — COMPREHENSIVE METABOLIC PANEL
ALK PHOS: 90 U/L (ref 38–126)
ALT: 15 U/L — ABNORMAL LOW (ref 17–63)
ANION GAP: 8 (ref 5–15)
AST: 19 U/L (ref 15–41)
Albumin: 3.5 g/dL (ref 3.5–5.0)
BILIRUBIN TOTAL: 0.4 mg/dL (ref 0.3–1.2)
BUN: 21 mg/dL — ABNORMAL HIGH (ref 6–20)
CALCIUM: 8.8 mg/dL — AB (ref 8.9–10.3)
CO2: 21 mmol/L — ABNORMAL LOW (ref 22–32)
Chloride: 108 mmol/L (ref 101–111)
Creatinine, Ser: 1.79 mg/dL — ABNORMAL HIGH (ref 0.61–1.24)
GFR, EST AFRICAN AMERICAN: 55 mL/min — AB (ref >60)
GFR, EST NON AFRICAN AMERICAN: 48 mL/min — AB (ref >60)
GLUCOSE: 92 mg/dL (ref 65–99)
POTASSIUM: 4.6 mmol/L (ref 3.5–5.1)
Sodium: 137 mmol/L (ref 135–145)
TOTAL PROTEIN: 8.7 g/dL — AB (ref 6.5–8.1)

## 2017-09-14 LAB — CBC WITH DIFFERENTIAL/PLATELET
Basophils Absolute: 0 10*3/uL (ref 0.0–0.1)
Basophils Relative: 0 %
Eosinophils Absolute: 0 10*3/uL (ref 0.0–0.7)
Eosinophils Relative: 1 %
HCT: 35.2 % — ABNORMAL LOW (ref 39.0–52.0)
Hemoglobin: 11.4 g/dL — ABNORMAL LOW (ref 13.0–17.0)
Lymphocytes Relative: 35 %
Lymphs Abs: 2.1 10*3/uL (ref 0.7–4.0)
MCH: 25.1 pg — ABNORMAL LOW (ref 26.0–34.0)
MCHC: 32.4 g/dL (ref 30.0–36.0)
MCV: 77.4 fL — ABNORMAL LOW (ref 78.0–100.0)
Monocytes Absolute: 0.7 10*3/uL (ref 0.1–1.0)
Monocytes Relative: 12 %
Neutro Abs: 3.1 10*3/uL (ref 1.7–7.7)
Neutrophils Relative %: 52 %
Platelets: 366 10*3/uL (ref 150–400)
RBC: 4.55 MIL/uL (ref 4.22–5.81)
RDW: 15.3 % (ref 11.5–15.5)
WBC: 5.9 10*3/uL (ref 4.0–10.5)

## 2017-09-14 LAB — LIPASE, BLOOD: LIPASE: 20 U/L (ref 11–51)

## 2017-09-14 MED ORDER — IPRATROPIUM BROMIDE 0.02 % IN SOLN
0.5000 mg | Freq: Once | RESPIRATORY_TRACT | Status: AC
Start: 2017-09-14 — End: 2017-09-14
  Administered 2017-09-14: 0.5 mg via RESPIRATORY_TRACT
  Filled 2017-09-14: qty 2.5

## 2017-09-14 MED ORDER — ALBUTEROL SULFATE (2.5 MG/3ML) 0.083% IN NEBU
5.0000 mg | INHALATION_SOLUTION | Freq: Once | RESPIRATORY_TRACT | Status: AC
  Administered 2017-09-14: 5 mg via RESPIRATORY_TRACT
  Filled 2017-09-14: qty 6

## 2017-09-14 MED ORDER — ONDANSETRON 4 MG PO TBDP
4.0000 mg | ORAL_TABLET | Freq: Once | ORAL | Status: AC
  Administered 2017-09-14: 4 mg via ORAL
  Filled 2017-09-14: qty 1

## 2017-09-14 NOTE — ED Provider Notes (Signed)
[place] DEPT Provider Note   CSN: [id number] Arrival date & time: 09/14/17  1221     History   Chief Complaint Chief Complaint  Patient presents with  . Abscess  . Emesis    HPI Jorge [name] is a 36 y.o. male.  Jorge [name] is a 36 y.o. Male who presents to the emergency department complaining of 4 cysts on his body for the past 2 months or more.  He also complains of several episodes of vomiting earlier today that has since resolved.  Patient tells me he has developed several cysts on his body over the past several months.  He complains of 1 to his right lateral thigh, to his left medial thigh, his left forearm and his left armpit.  He denies any color change or rashes.  No discharge from the area.  He reports they are tender and can hurt.  He reports he was in pain with these areas earlier today and then had 2 episodes of vomiting.  He denies any abdominal pain.  He reports his vomiting and nausea has resolved.  He has eaten since vomiting without any further vomiting.  He does report diminished appetite.  He is followed by a primary care doctor, but has not seen him for these cysts. He reports history of asthma and intermittent wheezing and uses his albuterol inhaler regularly. He denies fevers, rashes, abdominal pain, hematemesis, urinary symptoms.    The history is provided by the patient and medical records. No language interpreter was used.  Abscess  Associated symptoms: nausea and vomiting   Associated symptoms: no fever and no headaches   Emesis   Associated symptoms include cough. Pertinent negatives include no abdominal pain, no chills, no diarrhea, no fever and no headaches.    Past Medical History:  Diagnosis Date  . Asthma    stable no recent nebulizer use, uses Inhaler as needed  . 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 Esophagitis 08/19/2012   EGD  . GERD (gastroesophageal reflux disease)   . Hiatal  hernia 08/19/2012   EGD  . History of pericarditis   . Hypertension   . Lupus    "kidneys, lungs, heart" 01-18-16 "no recent problems-no steroid use recently"  . PAROTITIS, RIGHT 09/30/2009   Qualifier: Diagnosis of  By: Joya Gaskins MD, Burnett Harry   . Stricture esophagus 08/19/2012   EGD    Patient Active Problem List   Diagnosis Date Noted  . GERD (gastroesophageal reflux disease) 04/30/2014  . Mediastinal adenopathy 03/04/2014  . HTN (hypertension) 01/13/2014  . Lupus 01/20/2012  . Pulmonary nodule, right 01/20/2012  . BRONCHIECTASIS 06/26/2008    Past Surgical History:  Procedure Laterality Date  . [place] STUDY N/A 03/20/2016   Procedure: [place] STUDY;  Surgeon: Carol Ada, MD;  Location: [facility];  Service: Endoscopy;  Laterality: N/A;  . ESOPHAGEAL MANOMETRY N/A 03/20/2016   Procedure: ESOPHAGEAL MANOMETRY (EM);  Surgeon: Carol Ada, MD;  Location: [facility];  Service: Endoscopy;  Laterality: N/A;  . ESOPHAGOGASTRODUODENOSCOPY (EGD) WITH PROPOFOL N/A 05/29/2014   Procedure: ESOPHAGOGASTRODUODENOSCOPY (EGD) WITH PROPOFOL;  Surgeon: Beryle Beams, MD;  Location: [facility];  Service: Endoscopy;  Laterality: N/A;  . ESOPHAGOGASTRODUODENOSCOPY (EGD) WITH PROPOFOL N/A 07/24/2014   Procedure: ESOPHAGOGASTRODUODENOSCOPY (EGD) WITH PROPOFOL;  Surgeon: Beryle Beams, MD;  Location: [facility];  Service: Endoscopy;  Laterality: N/A;  . ESOPHAGOGASTRODUODENOSCOPY (EGD) WITH PROPOFOL N/A 01/28/2016   Procedure: ESOPHAGOGASTRODUODENOSCOPY (EGD)  WITH PROPOFOL;  Surgeon: Carol Ada, MD;  Location: [facility];  Service: Endoscopy;  Laterality: N/A;  . [place] IMPEDANCE STUDY N/A 03/20/2016   Procedure: [place] IMPEDANCE STUDY;  Surgeon: Carol Ada, MD;  Location: [facility];  Service: Endoscopy;  Laterality: N/A;  . TONSILLECTOMY    . WISDOM TOOTH EXTRACTION         Home Medications    Prior to Admission medications   Medication Sig Start Date End Date Taking?  Authorizing Provider  albuterol (PROVENTIL HFA;VENTOLIN HFA) 108 (90 Base) MCG/ACT inhaler Inhale 2 puffs into the lungs every 4 (four) hours as needed for wheezing or shortness of breath. 03/12/16  Yes Lajean Saver, MD  amLODipine (NORVASC) 10 MG tablet Take 10 mg by mouth every morning.   Yes [provider]  arformoterol (BROVANA) 15 MCG/2ML NEBU Take 2 mLs (15 mcg total) by nebulization 2 (two) times daily. DX code J47.9 01/06/16  Yes Mannam, Praveen, MD  hydroxychloroquine (PLAQUENIL) 200 MG tablet Take 400 mg by mouth every morning.   Yes [provider]  albuterol (PROVENTIL HFA;VENTOLIN HFA) 108 (90 Base) MCG/ACT inhaler Inhale 2 puffs into the lungs every 4 (four) hours as needed for wheezing or shortness of breath. Patient not taking: Reported on 09/14/2017 01/05/16   Marshell Garfinkel, MD  azithromycin (ZITHROMAX Z-PAK) 250 MG tablet Take as directed.  Take two (2) tablets today, then one (1) tablet a day for the next 4 days. Patient not taking: Reported on 09/14/2017 03/12/16   Lajean Saver, MD  omeprazole (PRILOSEC) 20 MG capsule Take 1 capsule (20 mg total) by mouth 2 (two) times daily before a meal. Patient not taking: Reported on 09/14/2017 01/05/16   Marshell Garfinkel, MD  oxyCODONE-acetaminophen (PERCOCET/ROXICET) 5-325 MG tablet Take 2 tablets by mouth every 4 (four) hours as needed for severe pain. Patient not taking: Reported on 09/14/2017 03/29/16   Janne Napoleon, NP  predniSONE (DELTASONE) 20 MG tablet Take 3 tablets (60 mg total) by mouth daily. Patient not taking: Reported on 09/14/2017 03/12/16   Lajean Saver, MD    Family History Family History  Problem Relation Age of Onset  . Lupus Father   . HIV Brother   . Thyroid cancer Maternal Aunt     Social History Social History   Tobacco Use  . Smoking status: Never Smoker  . Smokeless tobacco: Never Used  Substance Use Topics  . Alcohol use: Yes    Comment: Occasional wine  . Drug use: Yes    Types: Marijuana      Comment: quit using marijuana on 01/14/2014: 07-23-14 states no recent use.     Allergies   Patient has no known allergies.   Review of Systems Review of Systems  Constitutional: Negative for chills and fever.  HENT: Negative for congestion and sore throat.   Eyes: Negative for visual disturbance.  Respiratory: Positive for cough and wheezing. Negative for shortness of breath.   Cardiovascular: Negative for chest pain and palpitations.  Gastrointestinal: Positive for nausea and vomiting. Negative for abdominal pain and diarrhea.  Genitourinary: Negative for difficulty urinating, dysuria and hematuria.  Musculoskeletal: Negative for back pain and neck pain.  Skin: Negative for rash.       Cysts   Neurological: Negative for light-headedness, numbness and headaches.     Physical Exam Updated Vital Signs BP (!) 171/126   Pulse 94   Temp 98.3 F (36.8 C) (Oral)   Resp 18   SpO2 100%   Physical Exam  Constitutional: He appears well-developed and well-nourished. No distress.  Nontoxic appearing.  HENT:  Head: Normocephalic and atraumatic.  Mouth/Throat: Oropharynx is clear and moist.  Eyes: Conjunctivae are normal. Pupils are equal, round, and reactive to light. Right eye exhibits no discharge. Left eye exhibits no discharge.  Neck: Neck supple.  Cardiovascular: Normal rate, regular rhythm, normal heart sounds and intact distal pulses. Exam reveals no gallop and no friction rub.  No murmur heard. Pulmonary/Chest: Effort normal and breath sounds normal. No respiratory distress. He has no wheezes. He has no rales.  Lungs are clear to ascultation bilaterally. Symmetric chest expansion bilaterally. No increased work of breathing. No rales or rhonchi.   Abdominal: Soft. Bowel sounds are normal. He exhibits no distension. There is no tenderness. There is no guarding.  Abdomen is soft and nontender to palpation.  Musculoskeletal: He exhibits no edema.  Lymphadenopathy:    He has  no cervical adenopathy.  Neurological: He is alert. Coordination normal.  Skin: Skin is warm and dry. Capillary refill takes less than 2 seconds. No rash noted. He is not diaphoretic. No erythema. No pallor.  3 cm hard and mildly tender nodule like lesion to his right thigh, 1 cm area to his left medial thigh, 1 cm area to his left forearm and 1 cm area to his left axilla. No discharge from the areas. No overlying skin changes. Cystic like structures on ultrasound. No fluctuance or vesicles.   Psychiatric: He has a normal mood and affect. His behavior is normal.  Nursing note and vitals reviewed.    ED Treatments / Results  Labs (all labs ordered are listed, but only abnormal results are displayed) Labs Reviewed  CBC WITH DIFFERENTIAL/PLATELET - Abnormal; Notable for the following components:      Result Value   Hemoglobin 11.4 (*)    HCT 35.2 (*)    MCV 77.4 (*)    MCH 25.1 (*)    All other components within normal limits  COMPREHENSIVE METABOLIC PANEL - Abnormal; Notable for the following components:   CO2 21 (*)    BUN 21 (*)    Creatinine, Ser 1.79 (*)    Calcium 8.8 (*)    Total Protein 8.7 (*)    ALT 15 (*)    GFR calc non Af Amer 48 (*)    GFR calc Af Amer 55 (*)    All other components within normal limits  LIPASE, BLOOD    EKG  EKG Interpretation None       Radiology No results found.  Procedures Procedures (including critical care time)   EMERGENCY DEPARTMENT US SOFT TISSUE INTERPRETATION "Study: Limited Soft Tissue Ultrasound"  INDICATIONS: Pain Multiple views of the body part were obtained in real-time with a multi-frequency linear probe  PERFORMED BY: Myself IMAGES ARCHIVED?: Yes SIDE:Right  BODY PART:Lower extremity INTERPRETATION:  No cellulitis noted  Cystic like structure on ultrasound         Medications Ordered in ED Medications  ondansetron (ZOFRAN-ODT) disintegrating tablet 4 mg (4 mg Oral Given 09/14/17 1738)  albuterol  (PROVENTIL) (2.5 MG/3ML) 0.083% nebulizer solution 5 mg (5 mg Nebulization Given 09/14/17 1739)  ipratropium (ATROVENT) nebulizer solution 0.5 mg (0.5 mg Nebulization Given 09/14/17 1739)     Initial Impression / Assessment and Plan / ED Course  I have reviewed the triage vital signs and the nursing notes.  Pertinent labs & imaging results that were available during my care of the patient were reviewed by me and considered in  my medical decision making (see chart for details).     This  is a 36 y.o. Male who presents to the emergency department complaining of 4 cysts on his body for the past 2 months or more.  He also complains of several episodes of vomiting earlier today that has since resolved.  Patient tells me he has developed several cysts on his body over the past several months.  He complains of 1 to his right lateral thigh, to his left medial thigh, his left forearm and his left armpit.  He denies any color change or rashes.  No discharge from the area.  He reports they are tender and can hurt.  He reports he was in pain with these areas earlier today and then had 2 episodes of vomiting.  He denies any abdominal pain.  He reports his vomiting and nausea has resolved.  He has eaten since vomiting without any further vomiting.   He is followed by a primary care doctor, but has not seen them recently. He reports being compliant with his blood pressure medication.  On exam the patient is afebrile nontoxic-appearing.  He is tolerating p.o. without vomiting.  His abdomen is soft and nontender to palpation.  He has several hard cystic-like lesions noted on his body.  On ultrasound they appear to be cysts and have defined borders on ultrasound.  There is no fluctuance.  No overlying skin changes.  No erythema or warmth.  These have been ongoing for more than 2 months.  I discussed treatment options with the patient.  I discussed doing incision and drainage here or having him follow-up with [place] surgery.  He elects for follow-up with [place] surgery.  Blood work here reveals chronic kidney disease with a creatinine of 1.79.  This is around his baseline is and improved from his most recent blood work.  He has had chronic kidney disease at least for 3 years.  He is also hypertensive in the emergency department.  He tells me he is compliant with his blood pressure medications.  He has not followed up recently with his primary care doctor.  I encouraged him to continue taking his blood pressure medicine and follow-up closely with primary care for repeat blood pressure recheck and possible further medications for his hypertension.  He agrees with this plan.  He is tolerating p.o. to recheck.  Plan for discharge and close follow-up by primary care as well as followed by [facility] surgery.  Return precautions discussed. I advised the patient to follow-up with their primary care provider this week. I advised the patient to return to the emergency department with new or worsening symptoms or new concerns. The patient verbalized understanding and agreement with plan.     Final Clinical Impressions(s) / ED Diagnoses   Final diagnoses:  CKD (chronic kidney disease) stage 3, GFR 30-59 ml/min [facility])  Essential hypertension  Cyst of skin    ED Discharge Orders    None       Sharmaine Base 09/14/17 2053    Quintella Reichert, MD 09/16/17 1715

## 2017-09-14 NOTE — Discharge Instructions (Signed)
Please follow up with your primary care doctor for recheck of your blood pressure and continue taking your amlodipine.

## 2017-09-14 NOTE — ED Triage Notes (Signed)
Pt reports abscesses to his L forearm, L armpit, R outer thigh, and L distal inner thigh (near knee). He also reports intermittent nausea and vomiting (last episode of emesis this morning.) Pt has a hx of lupus, HTN (not medicated), and asthma. He states that he has not had abscesses before. A&Ox4. Ambulatory. Denies fever.

## 2017-09-19 ENCOUNTER — Other Ambulatory Visit: Payer: Self-pay

## 2017-09-19 NOTE — Patient Outreach (Addendum)
[facility]) Care Management  09/19/2017  Jorge [name] April 01, 1981 [id number]   Telephone Screen  Referral Date: 09/18/17 Referral Source: ED Census Report Referral Reason: " 6 or more ED visits in the Insurance: Medicare  Outreach attempt # 1 to patient. Spoke with patient. He voices he is doing fairly well since return home from ED. He states he has tried to all both [facility] office and [place] but has not been able to get through. Patient aware that due to bad weather office hours may be affected. He will continue to try to call over the next few days to get appt scheduled. He voices that he has supportive mother who provides him with transportation. He states he stopped taking his meds about six months ago but unable to provide definitive reason. He plans to restart taking meds and get new scripts when he establishes care with PCP. Patient states that on his insurance card he has listed [facility] as his PCP. He states he [facility] but if unable to he will contact other MD office. He voices that he was told while in the ED he has CKD (stage 3). He plans to speak with PCP further about this and what he can do to keep it from progressing. RN CM provided general info to patient in regards to diagnosis and ways he can help manage condition. Patient has PMH of lupus which also may contributing to kidney issues. He denies any further RN CM needs or concerns at this time. He is aware that his main priority is to get established with PCP and f/u with surgeon regarding cysts removal.       Plan: RN CM will notify [facility] administrative assistant of case status. RN CM will send EMMI educational info on CKD to patient.    Enzo Montgomery, RN,BSN,CCM [place] Management Telephonic Care Management Coordinator Direct Phone: [phone] Toll Free: [phone] Fax: [phone]

## 2017-09-28 ENCOUNTER — Ambulatory Visit: Payer: Self-pay | Admitting: Surgery

## 2017-09-28 DIAGNOSIS — R229 Localized swelling, mass and lump, unspecified: Secondary | ICD-10-CM | POA: Diagnosis not present

## 2017-09-28 NOTE — H&P (Signed)
Jose Persia Documented: 09/28/2017 10:17 AM Location: [facility] Patient #: [id number] DOB: 12/20/1980 Single / Language: Jorge [name] / Race: Black or African American Male  History of Present Illness [name] A. Natalyia Innes MD; 09/28/2017 10:34 AM) Patient words: Patient presents for evaluation of multiple nodules. He's had these for 3-4 months. The itching, some mild discomfort. They're located in his left forearm, left axilla, and bilateral anterior thighs. He was seen in the [place] long emergency room and referred for surgical removal. The areas have slowly grown. They cause symptoms of discomfort and itching but no bleeding or drainage. He's had no fever or chills. He does have lupus and is on multiple medications for that but he is off prednisone currently. He has not lost weight but was told he has chronic kidney disease which is new to him.  The patient is a 36 year old male.   Allergies ([name], [place]; 09/28/2017 10:18 AM) No Known Drug Allergies [04/12/2016]: Allergies Reconciled  Medication History ([name], [place]; 09/28/2017 10:20 AM) Ventolin HFA (108 (90 Base)MCG/ACT Aerosol Soln, Inhalation) Active. Amlodipine-Atorvastatin (10-10MG  Tablet, Oral) Active. Arformoterol Tartrate (15MCG/2ML Nebulized Soln, Inhalation) Active. Zithromax Z-Pak (250MG  Tablet, Oral) Active. Hydroxychloroquine Sulfate (200MG  Tablet, Oral) Active. Omeprazole (20MG  Capsule DR, Oral) Active. Medications Reconciled    Vitals ([name]; 09/28/2017 10:18 AM) 09/28/2017 10:18 AM Weight: 151 lb Height: 68in Body Surface Area: 1.81 m Body Mass Index: 22.96 kg/m  Temp.: 98.68F  Pulse: 94 (Regular)  BP: 144/86 (Sitting, Left Arm, Standard)      Physical Exam (Diksha Tagliaferro A. Ceira Hoeschen MD; 09/28/2017 10:35 AM)  General Mental Status-Alert. General Appearance-Consistent with stated age. Hydration-Well  hydrated. Voice-Normal.  Integumentary Note: Left forearm on the volar aspect 1 cm mobile nodule. Not read or fluctuant left axilla 3 cm mobile nodule subcutaneous not fluctuant or draining right anterior thighs a 4 cm mobile nodule in left anterior thighs and 3 cm mobile nodule. No signs of redness or drainage. They are itchy states. They're getting larger he thinks.   Chest and Lung Exam Chest and lung exam reveals -quiet, even and easy respiratory effort with no use of accessory muscles and on auscultation, normal breath sounds, no adventitious sounds and normal vocal resonance. Inspection Chest Wall - Normal. Back - normal.  Cardiovascular Cardiovascular examination reveals -normal heart sounds, regular rate and rhythm with no murmurs and normal pedal pulses bilaterally.  Neurologic Neurologic evaluation reveals -alert and oriented x 3 with no impairment of recent or remote memory. Mental Status-Normal.  Musculoskeletal Normal Exam - Left-Upper Extremity Strength Normal and Lower Extremity Strength Normal. Normal Exam - Right-Upper Extremity Strength Normal and Lower Extremity Strength Normal.    Assessment & Plan (Ronson Hagins A. Avree Szczygiel MD; 09/28/2017 10:36 AM)  SUBCUTANEOUS NODULES (R22.9) Impression: Discussed core biopsy versus excision. These may be related to his lupus. He will have problems healing is aware that he wants them removed duty itching and discomfort. Risks, benefits and other options of management and treatment were discussed. Prolonged wound healing and wound complications discussed with the patient. He would like these areas removed since a relatively new and larger. He understands that this could potentially lead to chronic wounds that he will very slowly and all. He wished to proceed.  Current Plans The anatomy and the physiology was discussed. The pathophysiology and natural history of the disease was discussed. Options were discussed and  recommendations were made. Technique, risks, benefits, & alternatives were discussed. Risks such as stroke, heart  attack, bleeding, indection, death, and other risks discussed. Questions answered. The patient agrees to proceed. The pathophysiology of skin & subcutaneous masses was discussed. Natural history risks without surgery were discussed. I recommended surgery to remove the mass. I explained the technique of removal with use of local anesthesia & possible need for more aggressive sedation/anesthesia for patient comfort.  Risks such as bleeding, infection, wound breakdown, heart attack, death, and other risks were discussed. I noted a good likelihood this will help address the problem. Possibility that this will not correct all symptoms was explained. Possibility of regrowth/recurrence of the mass was discussed. We will work to minimize complications. Questions were answered. The patient expresses understanding & wishes to proceed with surgery.  You are being scheduled for surgery- Our schedulers will call you.  You should hear from our office's scheduling department within 5 working days about the location, date, and time of surgery. We try to make accommodations for patient's preferences in scheduling surgery, but sometimes the OR schedule or the surgeon's schedule prevents [place] from making those accommodations.  If you have not heard from our office [phone]) in 5 working days, call the office and ask for your surgeon's nurse.  If you have other questions about your diagnosis, plan, or surgery, call the office and ask for your surgeon's nurse.  Pt Education - [facility] Free Text Education/Instructions: discussed with patient and provided information.

## 2017-09-28 NOTE — H&P (View-Only) (Signed)
Jorge [name] Documented: 09/28/2017 10:17 AM Location: [facility] Patient #: [id number] DOB: [birth year] Single / Language: Jorge [name] / Race: Black or African American Male  History of Present Illness Jorge Moores A. [name] MD; 09/28/2017 10:34 AM) Patient words: Patient presents for evaluation of multiple nodules. He's had these for 3-4 months. The itching, some mild discomfort. They're located in his left forearm, left axilla, and bilateral anterior thighs. He was seen in the [name] emergency room and referred for surgical removal. The areas have slowly grown. They cause symptoms of discomfort and itching but no bleeding or drainage. He's had no fever or chills. He does have lupus and is on multiple medications for that but he is off prednisone currently. He has not lost weight but was told he has chronic kidney disease which is new to him.  The patient is a 36 year old male.   Allergies (Jorge [name], [place]; 09/28/2017 10:18 AM) No Known Drug Allergies [04/12/2016]: Allergies Reconciled  Medication History (Jorge [name], [name]; 09/28/2017 10:20 AM) Ventolin HFA (108 (90 Base)MCG/ACT Aerosol Soln, Inhalation) Active. Amlodipine-Atorvastatin (10-10MG  Tablet, Oral) Active. Arformoterol Tartrate (15MCG/2ML Nebulized Soln, Inhalation) Active. Zithromax Z-Pak (250MG  Tablet, Oral) Active. Hydroxychloroquine Sulfate (200MG  Tablet, Oral) Active. Omeprazole (20MG  Capsule DR, Oral) Active. Medications Reconciled    Vitals (Jorge [name] RMA; 09/28/2017 10:18 AM) 09/28/2017 10:18 AM Weight: 151 lb Height: 68in Body Surface Area: 1.81 m Body Mass Index: 22.96 kg/m  Temp.: 98.73F  Pulse: 94 (Regular)  BP: 144/86 (Sitting, Left Arm, Standard)      Physical Exam (Jorge Rorie A. Juanice Warburton MD; 09/28/2017 10:35 AM)  General Mental Status-Alert. General Appearance-Consistent with stated age. Hydration-Well  hydrated. Voice-Normal.  Integumentary Note: Left forearm on the volar aspect 1 cm mobile nodule. Not read or fluctuant left axilla 3 cm mobile nodule subcutaneous not fluctuant or draining right anterior thighs a 4 cm mobile nodule in left anterior thighs and 3 cm mobile nodule. No signs of redness or drainage. They are itchy states. They're getting larger he thinks.   Chest and Lung Exam Chest and lung exam reveals -quiet, even and easy respiratory effort with no use of accessory muscles and on auscultation, normal breath sounds, no adventitious sounds and normal vocal resonance. Inspection Chest Wall - Normal. Back - normal.  Cardiovascular Cardiovascular examination reveals -normal heart sounds, regular rate and rhythm with no murmurs and normal pedal pulses bilaterally.  Neurologic Neurologic evaluation reveals -alert and oriented x 3 with no impairment of recent or remote memory. Mental Status-Normal.  Musculoskeletal Normal Exam - Left-Upper Extremity Strength Normal and Lower Extremity Strength Normal. Normal Exam - Right-Upper Extremity Strength Normal and Lower Extremity Strength Normal.    Assessment & Plan (Jorge Potenza A. Annaleigh Steinmeyer MD; 09/28/2017 10:36 AM)  SUBCUTANEOUS NODULES (R22.9) Impression: Discussed core biopsy versus excision. These may be related to his lupus. He will have problems healing is aware that he wants them removed duty itching and discomfort. Risks, benefits and other options of management and treatment were discussed. Prolonged wound healing and wound complications discussed with the patient. He would like these areas removed since a relatively new and larger. He understands that this could potentially lead to chronic wounds that he will very slowly and all. He wished to proceed.  Current Plans The anatomy and the physiology was discussed. The pathophysiology and natural history of the disease was discussed. Options were discussed and  recommendations were made. Technique, risks, benefits, & alternatives were discussed. Risks such as stroke, heart  attack, bleeding, indection, death, and other risks discussed. Questions answered. The patient agrees to proceed. The pathophysiology of skin & subcutaneous masses was discussed. Natural history risks without surgery were discussed. I recommended surgery to remove the mass. I explained the technique of removal with use of local anesthesia & possible need for more aggressive sedation/anesthesia for patient comfort.  Risks such as bleeding, infection, wound breakdown, heart attack, death, and other risks were discussed. I noted a good likelihood this will help address the problem. Possibility that this will not correct all symptoms was explained. Possibility of regrowth/recurrence of the mass was discussed. We will work to minimize complications. Questions were answered. The patient expresses understanding & wishes to proceed with surgery.  You are being scheduled for surgery- Our schedulers will call you.  You should hear from our office's scheduling department within 5 working days about the location, date, and time of surgery. We try to make accommodations for patient's preferences in scheduling surgery, but sometimes the OR schedule or the surgeon's schedule prevents [place] from making those accommodations.  If you have not heard from our office [phone]) in 5 working days, call the office and ask for your surgeon's nurse.  If you have other questions about your diagnosis, plan, or surgery, call the office and ask for your surgeon's nurse.  Pt Education - [facility] Free Text Education/Instructions: discussed with patient and provided information.

## 2017-10-08 ENCOUNTER — Other Ambulatory Visit: Payer: Self-pay

## 2017-10-08 ENCOUNTER — Encounter (HOSPITAL_BASED_OUTPATIENT_CLINIC_OR_DEPARTMENT_OTHER): Payer: Self-pay | Admitting: *Deleted

## 2017-10-08 NOTE — Pre-Procedure Instructions (Signed)
To come for EKG; to pick up Ensure pre-surgery drink 10 oz. - to drink by 0900 DOS.

## 2017-10-16 ENCOUNTER — Ambulatory Visit (HOSPITAL_BASED_OUTPATIENT_CLINIC_OR_DEPARTMENT_OTHER)
Admission: RE | Admit: 2017-10-16 | Discharge: 2017-10-16 | Disposition: A | Discharge status: Home / Self Care | Payer: Medicare Other | Source: Ambulatory Visit | Attending: Surgery | Admitting: Surgery

## 2017-10-16 ENCOUNTER — Encounter (HOSPITAL_BASED_OUTPATIENT_CLINIC_OR_DEPARTMENT_OTHER)
Admission: RE | Disposition: A | Discharge status: Home / Self Care | Payer: Self-pay | Source: Ambulatory Visit | Attending: Surgery

## 2017-10-16 ENCOUNTER — Encounter (HOSPITAL_BASED_OUTPATIENT_CLINIC_OR_DEPARTMENT_OTHER): Payer: Self-pay

## 2017-10-16 ENCOUNTER — Ambulatory Visit (HOSPITAL_BASED_OUTPATIENT_CLINIC_OR_DEPARTMENT_OTHER): Payer: Medicare Other | Admitting: Anesthesiology

## 2017-10-16 ENCOUNTER — Other Ambulatory Visit: Payer: Self-pay

## 2017-10-16 DIAGNOSIS — R222 Localized swelling, mass and lump, trunk: Secondary | ICD-10-CM | POA: Diagnosis not present

## 2017-10-16 DIAGNOSIS — R2243 Localized swelling, mass and lump, lower limb, bilateral: Secondary | ICD-10-CM | POA: Diagnosis not present

## 2017-10-16 DIAGNOSIS — L308 Other specified dermatitis: Secondary | ICD-10-CM | POA: Diagnosis not present

## 2017-10-16 DIAGNOSIS — M329 Systemic lupus erythematosus, unspecified: Secondary | ICD-10-CM | POA: Insufficient documentation

## 2017-10-16 DIAGNOSIS — M318 Other specified necrotizing vasculopathies: Secondary | ICD-10-CM | POA: Diagnosis not present

## 2017-10-16 DIAGNOSIS — K219 Gastro-esophageal reflux disease without esophagitis: Secondary | ICD-10-CM | POA: Insufficient documentation

## 2017-10-16 DIAGNOSIS — R2241 Localized swelling, mass and lump, right lower limb: Secondary | ICD-10-CM | POA: Diagnosis not present

## 2017-10-16 DIAGNOSIS — I10 Essential (primary) hypertension: Secondary | ICD-10-CM | POA: Insufficient documentation

## 2017-10-16 DIAGNOSIS — J45909 Unspecified asthma, uncomplicated: Secondary | ICD-10-CM | POA: Diagnosis not present

## 2017-10-16 DIAGNOSIS — Z79899 Other long term (current) drug therapy: Secondary | ICD-10-CM | POA: Diagnosis not present

## 2017-10-16 DIAGNOSIS — R2242 Localized swelling, mass and lump, left lower limb: Secondary | ICD-10-CM | POA: Diagnosis not present

## 2017-10-16 DIAGNOSIS — R2232 Localized swelling, mass and lump, left upper limb: Secondary | ICD-10-CM | POA: Diagnosis not present

## 2017-10-16 HISTORY — PX: MASS EXCISION: SHX2000

## 2017-10-16 HISTORY — DX: Localized swelling, mass and lump, unspecified: R22.9

## 2017-10-16 SURGERY — EXCISION MASS
Anesthesia: General | Site: Arm Lower

## 2017-10-16 MED ORDER — HYDRALAZINE HCL 20 MG/ML IJ SOLN
5.0000 mg | Freq: Four times a day (QID) | INTRAMUSCULAR | Status: DC | PRN
  Administered 2017-10-16: 5 mg via INTRAVENOUS

## 2017-10-16 MED ORDER — CHLORHEXIDINE GLUCONATE CLOTH 2 % EX PADS: 6.0000 | MEDICATED_PAD | Freq: Once | CUTANEOUS | Status: DC

## 2017-10-16 MED ORDER — GABAPENTIN 300 MG PO CAPS
300.0000 mg | ORAL_CAPSULE | ORAL | Status: AC
  Administered 2017-10-16: 300 mg via ORAL

## 2017-10-16 MED ORDER — LACTATED RINGERS IV SOLN
INTRAVENOUS | Status: DC
  Administered 2017-10-16 (×2): via INTRAVENOUS

## 2017-10-16 MED ORDER — HYDRALAZINE HCL 20 MG/ML IJ SOLN
10.0000 mg | Freq: Four times a day (QID) | INTRAMUSCULAR | Status: DC | PRN
  Administered 2017-10-16: 10 mg via INTRAVENOUS

## 2017-10-16 MED ORDER — FENTANYL CITRATE (PF) 100 MCG/2ML IJ SOLN
50.0000 ug | INTRAMUSCULAR | Status: DC | PRN
  Administered 2017-10-16 (×2): 50 ug via INTRAVENOUS

## 2017-10-16 MED ORDER — PROPOFOL 10 MG/ML IV BOLUS
INTRAVENOUS | Status: DC | PRN
  Administered 2017-10-16: 90 mg via INTRAVENOUS
  Administered 2017-10-16: 200 mg via INTRAVENOUS
  Administered 2017-10-16: 100 mg via INTRAVENOUS

## 2017-10-16 MED ORDER — SUGAMMADEX SODIUM 200 MG/2ML IV SOLN
INTRAVENOUS | Status: AC
  Filled 2017-10-16: qty 2

## 2017-10-16 MED ORDER — HYDRALAZINE HCL 20 MG/ML IJ SOLN
INTRAMUSCULAR | Status: AC
  Filled 2017-10-16: qty 1

## 2017-10-16 MED ORDER — METOCLOPRAMIDE HCL 5 MG/ML IJ SOLN: 10.0000 mg | Freq: Once | INTRAMUSCULAR | Status: DC | PRN

## 2017-10-16 MED ORDER — FENTANYL CITRATE (PF) 100 MCG/2ML IJ SOLN
25.0000 ug | INTRAMUSCULAR | Status: DC | PRN
  Administered 2017-10-16: 25 ug via INTRAVENOUS

## 2017-10-16 MED ORDER — IBUPROFEN 800 MG PO TABS: 800.0000 mg | ORAL_TABLET | Freq: Three times a day (TID) | ORAL | 0 refills | Status: DC | PRN

## 2017-10-16 MED ORDER — GABAPENTIN 300 MG PO CAPS
ORAL_CAPSULE | ORAL | Status: AC
  Filled 2017-10-16: qty 1

## 2017-10-16 MED ORDER — LACTATED RINGERS IV SOLN: INTRAVENOUS | Status: DC

## 2017-10-16 MED ORDER — DEXTROSE 5 % IV SOLN
3.0000 g | INTRAVENOUS | Status: AC
  Administered 2017-10-16: 2 g via INTRAVENOUS

## 2017-10-16 MED ORDER — MIDAZOLAM HCL 2 MG/2ML IJ SOLN
1.0000 mg | INTRAMUSCULAR | Status: DC | PRN
  Administered 2017-10-16: 2 mg via INTRAVENOUS

## 2017-10-16 MED ORDER — BUPIVACAINE-EPINEPHRINE 0.25% -1:200000 IJ SOLN
INTRAMUSCULAR | Status: DC | PRN
  Administered 2017-10-16: 27 mL

## 2017-10-16 MED ORDER — PROPOFOL 10 MG/ML IV BOLUS
INTRAVENOUS | Status: AC
  Filled 2017-10-16: qty 40

## 2017-10-16 MED ORDER — ONDANSETRON HCL 4 MG/2ML IJ SOLN
INTRAMUSCULAR | Status: DC | PRN
  Administered 2017-10-16: 4 mg via INTRAVENOUS

## 2017-10-16 MED ORDER — ACETAMINOPHEN 500 MG PO TABS
ORAL_TABLET | ORAL | Status: AC
  Filled 2017-10-16: qty 2

## 2017-10-16 MED ORDER — DEXAMETHASONE SODIUM PHOSPHATE 10 MG/ML IJ SOLN
INTRAMUSCULAR | Status: DC | PRN
  Administered 2017-10-16: 10 mg via INTRAVENOUS

## 2017-10-16 MED ORDER — CEFAZOLIN SODIUM-DEXTROSE 2-4 GM/100ML-% IV SOLN
INTRAVENOUS | Status: AC
  Filled 2017-10-16: qty 100

## 2017-10-16 MED ORDER — FENTANYL CITRATE (PF) 100 MCG/2ML IJ SOLN
INTRAMUSCULAR | Status: AC
  Filled 2017-10-16: qty 2

## 2017-10-16 MED ORDER — LIDOCAINE 2% (20 MG/ML) 5 ML SYRINGE
INTRAMUSCULAR | Status: AC
  Filled 2017-10-16: qty 5

## 2017-10-16 MED ORDER — HYDROCODONE-ACETAMINOPHEN 5-325 MG PO TABS: 1.0000 | ORAL_TABLET | Freq: Four times a day (QID) | ORAL | 0 refills | Status: DC | PRN

## 2017-10-16 MED ORDER — SUCCINYLCHOLINE CHLORIDE 200 MG/10ML IV SOSY
PREFILLED_SYRINGE | INTRAVENOUS | Status: AC
  Filled 2017-10-16: qty 10

## 2017-10-16 MED ORDER — MIDAZOLAM HCL 2 MG/2ML IJ SOLN
INTRAMUSCULAR | Status: AC
  Filled 2017-10-16: qty 2

## 2017-10-16 MED ORDER — DEXAMETHASONE SODIUM PHOSPHATE 10 MG/ML IJ SOLN
INTRAMUSCULAR | Status: AC
  Filled 2017-10-16: qty 1

## 2017-10-16 MED ORDER — SUCCINYLCHOLINE CHLORIDE 200 MG/10ML IV SOSY
PREFILLED_SYRINGE | INTRAVENOUS | Status: DC | PRN
  Administered 2017-10-16: 100 mg via INTRAVENOUS

## 2017-10-16 MED ORDER — ONDANSETRON HCL 4 MG/2ML IJ SOLN
INTRAMUSCULAR | Status: AC
  Filled 2017-10-16: qty 2

## 2017-10-16 MED ORDER — MEPERIDINE HCL 25 MG/ML IJ SOLN: 6.2500 mg | INTRAMUSCULAR | Status: DC | PRN

## 2017-10-16 MED ORDER — ALBUTEROL SULFATE HFA 108 (90 BASE) MCG/ACT IN AERS
2.0000 | INHALATION_SPRAY | Freq: Once | RESPIRATORY_TRACT | Status: AC
  Administered 2017-10-16: 2 via RESPIRATORY_TRACT

## 2017-10-16 MED ORDER — SCOPOLAMINE 1 MG/3DAYS TD PT72: 1.0000 | MEDICATED_PATCH | Freq: Once | TRANSDERMAL | Status: DC | PRN

## 2017-10-16 MED ORDER — LIDOCAINE 2% (20 MG/ML) 5 ML SYRINGE
INTRAMUSCULAR | Status: DC | PRN
  Administered 2017-10-16: 100 mg via INTRAVENOUS

## 2017-10-16 MED ORDER — ACETAMINOPHEN 500 MG PO TABS
1000.0000 mg | ORAL_TABLET | ORAL | Status: AC
  Administered 2017-10-16: 1000 mg via ORAL

## 2017-10-16 SURGICAL SUPPLY — 43 items
BENZOIN TINCTURE PRP APPL 2/3 (GAUZE/BANDAGES/DRESSINGS) IMPLANT
BLADE SURG 10 STRL SS (BLADE) IMPLANT
BLADE SURG 15 STRL LF DISP TIS (BLADE) ×1 IMPLANT
BLADE SURG 15 STRL SS (BLADE) ×2
CANISTER SUCT 1200ML W/VALVE (MISCELLANEOUS) IMPLANT
CLOSURE WOUND 1/2 X4 (GAUZE/BANDAGES/DRESSINGS)
COVER BACK TABLE 60X90IN (DRAPES) ×3 IMPLANT
COVER MAYO STAND STRL (DRAPES) ×3 IMPLANT
DECANTER SPIKE VIAL GLASS SM (MISCELLANEOUS) IMPLANT
DERMABOND ADVANCED (GAUZE/BANDAGES/DRESSINGS)
DERMABOND ADVANCED .7 DNX12 (GAUZE/BANDAGES/DRESSINGS) IMPLANT
DRAPE LAPAROTOMY 100X72 PEDS (DRAPES) IMPLANT
DRAPE UTILITY XL STRL (DRAPES) IMPLANT
ELECT COATED BLADE 2.86 ST (ELECTRODE) ×3 IMPLANT
ELECT REM PT RETURN 9FT ADLT (ELECTROSURGICAL) ×3
ELECTRODE REM PT RTRN 9FT ADLT (ELECTROSURGICAL) ×1 IMPLANT
GLOVE BIO SURGEON STRL SZ 6.5 (GLOVE) ×2 IMPLANT
GLOVE BIO SURGEONS STRL SZ 6.5 (GLOVE) ×1
GLOVE BIOGEL PI IND STRL 7.0 (GLOVE) ×1 IMPLANT
GLOVE BIOGEL PI IND STRL 8 (GLOVE) ×1 IMPLANT
GLOVE BIOGEL PI INDICATOR 7.0 (GLOVE) ×2
GLOVE BIOGEL PI INDICATOR 8 (GLOVE) ×2
GLOVE ECLIPSE 8.0 STRL XLNG CF (GLOVE) ×3 IMPLANT
GOWN STRL REUS W/ TWL LRG LVL3 (GOWN DISPOSABLE) ×3 IMPLANT
GOWN STRL REUS W/TWL LRG LVL3 (GOWN DISPOSABLE) ×6
NEEDLE HYPO 25X1 1.5 SAFETY (NEEDLE) ×3 IMPLANT
NS IRRIG 1000ML POUR BTL (IV SOLUTION) ×3 IMPLANT
PACK BASIN DAY SURGERY FS (CUSTOM PROCEDURE TRAY) ×3 IMPLANT
PENCIL BUTTON HOLSTER BLD 10FT (ELECTRODE) ×3 IMPLANT
SLEEVE SCD COMPRESS KNEE MED (MISCELLANEOUS) ×3 IMPLANT
SPONGE LAP 4X18 X RAY DECT (DISPOSABLE) ×3 IMPLANT
STAPLER VISISTAT 35W (STAPLE) IMPLANT
STRIP CLOSURE SKIN 1/2X4 (GAUZE/BANDAGES/DRESSINGS) IMPLANT
SUT MON AB 4-0 PC3 18 (SUTURE) IMPLANT
SUT VICRYL 3-0 CR8 SH (SUTURE) ×6 IMPLANT
SUT VICRYL AB 3 0 TIES (SUTURE) IMPLANT
SYR CONTROL 10ML LL (SYRINGE) ×3 IMPLANT
TOWEL OR 17X24 6PK STRL BLUE (TOWEL DISPOSABLE) ×6 IMPLANT
TOWEL OR NON WOVEN STRL DISP B (DISPOSABLE) IMPLANT
TRAY DSU PREP LF (CUSTOM PROCEDURE TRAY) ×3 IMPLANT
TUBE CONNECTING 20'X1/4 (TUBING)
TUBE CONNECTING 20X1/4 (TUBING) IMPLANT
YANKAUER SUCT BULB TIP NO VENT (SUCTIONS) IMPLANT

## 2017-10-16 NOTE — Discharge Instructions (Signed)
GENERAL SURGERY: POST OP INSTRUCTIONS ° °###################################################################### ° °EAT °Gradually transition to a high fiber diet with a fiber supplement over the next few weeks after discharge.  Start with a pureed / full liquid diet (see below) ° °WALK °Walk an hour a day.  Control your pain to do that.   ° °CONTROL PAIN °Control pain so that you can walk, sleep, tolerate sneezing/coughing, go up/down stairs. ° °HAVE A BOWEL MOVEMENT DAILY °Keep your bowels regular to avoid problems.  OK to try a laxative to override constipation.  OK to use an antidairrheal to slow down diarrhea.  Call if not better after 2 tries ° °CALL IF YOU HAVE PROBLEMS/CONCERNS °Call if you are still struggling despite following these instructions. °Call if you have concerns not answered by these instructions ° °###################################################################### ° ° ° °1. DIET: Follow a light bland diet the first 24 hours after arrival home, such as soup, liquids, crackers, etc.  Be sure to include lots of fluids daily.  Avoid fast food or heavy meals as your are more likely to get nauseated.   °2. Take your usually prescribed home medications unless otherwise directed. °3. PAIN CONTROL: °a. Pain is best controlled by a usual combination of three different methods TOGETHER: °i. Ice/Heat °ii. Over the counter pain medication °iii. Prescription pain medication °b. Most patients will experience some swelling and bruising around the incisions.  Ice packs or heating pads (30-60 minutes up to 6 times a day) will help. Use ice for the first few days to help decrease swelling and bruising, then switch to heat to help relax tight/sore spots and speed recovery.  Some people prefer to use ice alone, heat alone, alternating between ice & heat.  Experiment to what works for you.  Swelling and bruising can take several weeks to resolve.   °c. It is helpful to take an over-the-counter pain medication  regularly for the first few weeks.  Choose one of the following that works best for you: °i. Naproxen (Aleve, etc)  Two 220mg tabs twice a day °ii. Ibuprofen (Advil, etc) Three 200mg tabs four times a day (every meal & bedtime) °iii. Acetaminophen (Tylenol, etc) 500-650mg four times a day (every meal & bedtime) °d. A  prescription for pain medication (such as oxycodone, hydrocodone, etc) should be given to you upon discharge.  Take your pain medication as prescribed.  °i. If you are having problems/concerns with the prescription medicine (does not control pain, nausea, vomiting, rash, itching, etc), please call us [phone] to see if we need to switch you to a different pain medicine that will work better for you and/or control your side effect better. °ii. If you need a refill on your pain medication, please contact your pharmacy.  They will contact our office to request authorization. Prescriptions will not be filled after 5 pm or on week-ends. °4. Avoid getting constipated.  Between the surgery and the pain medications, it is common to experience some constipation.  Increasing fluid intake and taking a fiber supplement (such as Metamucil, Citrucel, FiberCon, MiraLax, etc) 1-2 times a day regularly will usually help prevent this problem from occurring.  A mild laxative (prune juice, Milk of Magnesia, MiraLax, etc) should be taken according to package directions if there are no bowel movements after 48 hours.   °5. Wash / shower every day.  You may shower over the dressings as they are waterproof.  Continue to shower over incision(s) after the dressing is off. °6. Remove your waterproof bandages   5 days after surgery.  You may leave the incision open to air.  You may have skin tapes (Steri Strips) covering the incision(s).  Leave them on until one week, then remove.  You may replace a dressing/Band-Aid to cover the incision for comfort if you wish.  ° ° ° ° °7. ACTIVITIES as tolerated:   °a. You may resume  regular (light) daily activities beginning the next day--such as daily self-care, walking, climbing stairs--gradually increasing activities as tolerated.  If you can walk 30 minutes without difficulty, it is safe to try more intense activity such as jogging, treadmill, bicycling, low-impact aerobics, swimming, etc. °b. Save the most intensive and strenuous activity for last such as sit-ups, heavy lifting, contact sports, etc  Refrain from any heavy lifting or straining until you are off narcotics for pain control.   °c. DO NOT PUSH THROUGH PAIN.  Let pain be your guide: If it hurts to do something, don't do it.  Pain is your body warning you to avoid that activity for another week until the pain goes down. °d. You may drive when you are no longer taking prescription pain medication, you can comfortably wear a seatbelt, and you can safely maneuver your car and apply brakes. °e. You may have sexual intercourse when it is comfortable.  °8. FOLLOW UP in our office °a. Please call [facility] at [phone] to set up an appointment to see your surgeon in the office for a follow-up appointment approximately 2-3 weeks after your surgery. °b. Make sure that you call for this appointment the day you arrive home to insure a convenient appointment time. °9. IF YOU HAVE DISABILITY OR FAMILY LEAVE FORMS, BRING THEM TO THE OFFICE FOR PROCESSING.  DO NOT GIVE THEM TO YOUR DOCTOR. ° ° °WHEN TO CALL US [phone]: °1. Poor pain control °2. Reactions / problems with new medications (rash/itching, nausea, etc)  °3. Fever over 101.5 F (38.5 C) °4. Worsening swelling or bruising °5. Continued bleeding from incision. °6. Increased pain, redness, or drainage from the incision °7. Difficulty breathing / swallowing ° ° The clinic staff is available to answer your questions during regular business hours (8:30am-5pm).  Please don’t hesitate to call and ask to speak to one of our nurses for clinical concerns.  ° If you have a medical emergency,  go to the nearest emergency room or call 911. ° A surgeon from [facility] is always on call at the hospitals ° ° °[facility] °[address], [address], [place],   [address] ? °MAIN: [phone] ? TOLL FREE: [phone] ?  °FAX [phone] °[email or web address] ° ° °Post Anesthesia Home Care Instructions ° °Activity: °Get plenty of rest for the remainder of the day. A responsible individual must stay with you for 24 hours following the procedure.  °For the next 24 hours, DO NOT: °-Drive a car °-Operate machinery °-Drink alcoholic beverages °-Take any medication unless instructed by your physician °-Make any legal decisions or sign important papers. ° °Meals: °Start with liquid foods such as gelatin or soup. Progress to regular foods as tolerated. Avoid greasy, spicy, heavy foods. If nausea and/or vomiting occur, drink only clear liquids until the nausea and/or vomiting subsides. Call your physician if vomiting continues. ° °Special Instructions/Symptoms: °Your throat may feel dry or sore from the anesthesia or the breathing tube placed in your throat during surgery. If this causes discomfort, gargle with warm salt water. The discomfort should disappear within 24 hours. ° °  If you had a scopolamine patch placed behind your ear for the management of post- operative nausea and/or vomiting: ° °1. The medication in the patch is effective for 72 hours, after which it should be removed.  Wrap patch in a tissue and discard in the trash. Wash hands thoroughly with soap and water. °2. You may remove the patch earlier than 72 hours if you experience unpleasant side effects which may include dry mouth, dizziness or visual disturbances. °3. Avoid touching the patch. Wash your hands with soap and water after contact with the patch. °  ° ° °

## 2017-10-16 NOTE — Anesthesia Preprocedure Evaluation (Signed)
Anesthesia Evaluation  Patient identified by MRN, date of birth, ID band Patient awake    Reviewed: Allergy & Precautions, NPO status , Patient's Chart, lab work & pertinent test results  Airway Mallampati: II  TM Distance: >3 FB Neck ROM: Full    Dental no notable dental hx.    Pulmonary asthma ,     + wheezing      Cardiovascular hypertension, Pt. on medications Normal cardiovascular exam Rhythm:Regular Rate:Normal     Neuro/Psych negative neurological ROS  negative psych ROS   GI/Hepatic Neg liver ROS, GERD  ,  Endo/Other  negative endocrine ROS  Renal/GU negative Renal ROS  negative genitourinary   Musculoskeletal negative musculoskeletal ROS (+) Lupus   Abdominal   Peds negative pediatric ROS (+)  Hematology negative hematology ROS (+)   Anesthesia Other Findings   Reproductive/Obstetrics negative OB ROS                             Anesthesia Physical Anesthesia Plan  ASA: III  Anesthesia Plan: General   Post-op Pain Management:    Induction: Intravenous  PONV Risk Score and Plan: 2 and Ondansetron and Treatment may vary due to age or medical condition  Airway Management Planned: LMA  Additional Equipment:   Intra-op Plan:   Post-operative Plan: Extubation in OR  Informed Consent: I have reviewed the patients History and Physical, chart, labs and discussed the procedure including the risks, benefits and alternatives for the proposed anesthesia with the patient or authorized representative who has indicated his/her understanding and acceptance.   Dental advisory given  Plan Discussed with: CRNA  Anesthesia Plan Comments: (Difficult Iv access)        Anesthesia Quick Evaluation

## 2017-10-16 NOTE — Transfer of Care (Signed)
Immediate Anesthesia Transfer of Care Note  Patient: Jorge [name]  Procedure(s) Performed: EXCISION MASS LEFT THIGH, RIGHT THIGH, LEFT FOREARM, LEFT AXILLA (N/A Arm Lower)  Patient Location: PACU  Anesthesia Type:General  Level of Consciousness: drowsy, patient cooperative and responds to stimulation  Airway & Oxygen Therapy: Patient Spontanous Breathing and Patient connected to face mask oxygen  Post-op Assessment: Report given to RN and Post -op Vital signs reviewed and stable  Post vital signs: Reviewed, BP elevated. Near pre-op  Last Vitals:  Vitals:   10/16/17 1024 10/16/17 1407  BP: (!) 170/112 (!) 171/133  Pulse: 92 (P) 89  Resp: 20 19  Temp: 36.8 C   SpO2: 100% (P) 100%    Last Pain:  Vitals:   10/16/17 1024  TempSrc: Oral         Complications: No apparent anesthesia complications

## 2017-10-16 NOTE — Anesthesia Procedure Notes (Signed)
Procedure Name: LMA Insertion Date/Time: 10/16/2017 12:54 PM Performed by: Bonney Aid, CRNA Pre-anesthesia Checklist: Patient identified, Emergency Drugs available, Suction available and Patient being monitored Patient Re-evaluated:Patient Re-evaluated prior to induction Oxygen Delivery Method: Circle system utilized Preoxygenation: Pre-oxygenation with 100% oxygen Induction Type: IV induction Ventilation: Mask ventilation without difficulty LMA: LMA inserted LMA Size: 4.0 Number of attempts: 1 Airway Equipment and Method: Bite block Placement Confirmation: positive ETCO2 Tube secured with: Tape Dental Injury: Teeth and Oropharynx as per pre-operative assessment

## 2017-10-16 NOTE — Anesthesia Postprocedure Evaluation (Signed)
Anesthesia Post Note  Patient: Jorge [name]  Procedure(s) Performed: EXCISION MASS LEFT THIGH, RIGHT THIGH, LEFT FOREARM, LEFT AXILLA (N/A Arm Lower)     Patient location during evaluation: PACU Anesthesia Type: General Level of consciousness: awake and alert Pain management: pain level controlled Vital Signs Assessment: post-procedure vital signs reviewed and stable Respiratory status: spontaneous breathing, nonlabored ventilation and respiratory function stable Cardiovascular status: blood pressure returned to baseline and stable Postop Assessment: no apparent nausea or vomiting Anesthetic complications: yes Anesthetic complication details: respiratory event and anesthesia complicationsComments: Patient with cough and possible aspiration event with LMA in place during case.  MDA called to room and decision made to intubate to protect airway.  LMA removed, oropharynx suctioned, and ETT placed by MDA. Case continued uneventfully, patient extubated, and did well post-operatively from a respiratory standpoint.    Last Vitals:  Vitals:   10/16/17 1545 10/16/17 1600  BP:  (!) 155/101  Pulse: (!) 109 (!) 116  Resp: 19 16  Temp:  36.9 C  SpO2: 99% 100%    Last Pain:  Vitals:   10/16/17 1600  TempSrc:   PainSc: 2                  Catalina Gravel

## 2017-10-16 NOTE — Op Note (Signed)
Preoperative diagnosis: Right anterior thigh nodule subcutaneous measuring 4 cm, left anterior thigh nodule measuring 4 cm subcutaneous, left forearm nodule subcutaneous measuring 2 cm, and left axillary nodule subcutaneous measuring 4 cm  Postoperative diagnosis: Same  Procedure: Excision of right thigh, left thigh, left forearm, and left axillary nodules  Surgeon: Erroll Luna MD  Anesthesia: General with local  EBL: 20 cc  Specimens: Nodules as stated above  Drains: None  IV fluids: Per anesthesia record  Indications for procedure: The patient is a 37 year old male with history of lupus that has multiple subcutaneous nodules that are getting larger.  They are causing pain, itching and discomfort.  They are located on his right thigh, left thigh, left forearm, and left axilla.  They have been present for a number of years but recently it got larger quite quickly.  He desires excision of these nodules for diagnostic and therapeutic purposes.The procedure has been discussed with the patient.  Alternative therapies have been discussed with the patient.  Operative risks include bleeding,  Infection,  Organ injury,  Nerve injury,  Blood vessel injury,  DVT,  Pulmonary embolism,  Death,  And possible reoperation.  Medical management risks include worsening of present situation.  The success of the procedure is 50 -90 % at treating patients symptoms.  The patient understands and agrees to proceed.   Description of procedure: The patient was met in the holding area and all 4 areas were marked.  Questions were answered.  He was taken back to the operating room and placed supine upon the operating room table.  After induction of general anesthesia both thighs, left arm and left axilla were prepped and draped in sterile fashion.  Timeout was done.  The right thigh was done first.  Local anesthetic was infiltrated around the nodule.  It was excised down into the subcutaneous fat in its entirety.  This  wound was closed with 3-0 Vicryl.  In a similar fashion the left anterior thigh nodule was excised.  It was taken into the subcutaneous fat.  He was grossly removed in its entirety.  This wound was closed with 3-0 Vicryl.  The left axilla was done next.  In a similar fashion local anesthetic was infiltrated around the lesion.  Curvilinear incision was made above and below the mass and the subcutaneous mass was excised in its entirety down to healthy-appearing fat.  Hemostasis achieved with cautery.  Wound closed with 3-0 Vicryl and 4-0 Monocryl layer.  In a similar fashion the 2 cm nodule in the left lateral forearm was excised with local anesthetic.  He was closed with 3-0 Vicryl.  Dermabond was applied to all 3 incisions.  All final counts were found to be correct.  The patient was taken to the recovery room in satisfactory condition extubated.

## 2017-10-16 NOTE — Interval H&P Note (Signed)
History and Physical Interval Note:  10/16/2017 12:24 PM  Jorge [name]  has presented today for surgery, with the diagnosis of SUBCUTANEOUS NODULES  The various methods of treatment have been discussed with the patient and family. After consideration of risks, benefits and other options for treatment, the patient has consented to  Procedure(s): EXCISION MASS LEFT THIGH, RIGHT THIGH, LEFT FOREARM, LEFT AXILLA (N/A) as a surgical intervention .  The patient's history has been reviewed, patient examined, no change in status, stable for surgery.  I have reviewed the patient's chart and labs.  Questions were answered to the patient's satisfaction.     [place]

## 2017-10-16 NOTE — Anesthesia Procedure Notes (Signed)
Procedure Name: Intubation Date/Time: 10/16/2017 1:31 PM Performed by: Bonney Aid, CRNA Pre-anesthesia Checklist: Patient identified, Emergency Drugs available, Suction available and Patient being monitored Patient Re-evaluated:Patient Re-evaluated prior to induction Oxygen Delivery Method: Circle system utilized Preoxygenation: Pre-oxygenation with 100% oxygen Induction Type: IV induction Ventilation: Mask ventilation without difficulty Laryngoscope Size: Mac and 4 Grade View: Grade I Tube type: Oral Number of attempts: 1 Airway Equipment and Method: Stylet Placement Confirmation: ETT inserted through vocal cords under direct vision,  positive ETCO2 and breath sounds checked- equal and bilateral Secured at: 22 cm Tube secured with: Tape Dental Injury: Teeth and Oropharynx as per pre-operative assessment  Comments: Coughing on LMA with possible vomiting, mouth suctioned well.  LMA removed.  Suctioned again well, easy mask.  Intubated per Dr Gifford Shave, teeth unchanged.

## 2017-10-18 ENCOUNTER — Encounter (HOSPITAL_BASED_OUTPATIENT_CLINIC_OR_DEPARTMENT_OTHER): Payer: Self-pay | Admitting: Surgery

## 2018-03-07 IMAGING — CR DG CHEST 2V
2 series · 2 of 2 positions shown · non-contrast
Comparison: March 10, 2015 chest radiograph and chest CT November 10, 2014

CLINICAL DATA: Cough and congestion with shortness of breath for 2
days

EXAM:
CHEST  2 VIEW

[w chest pa]
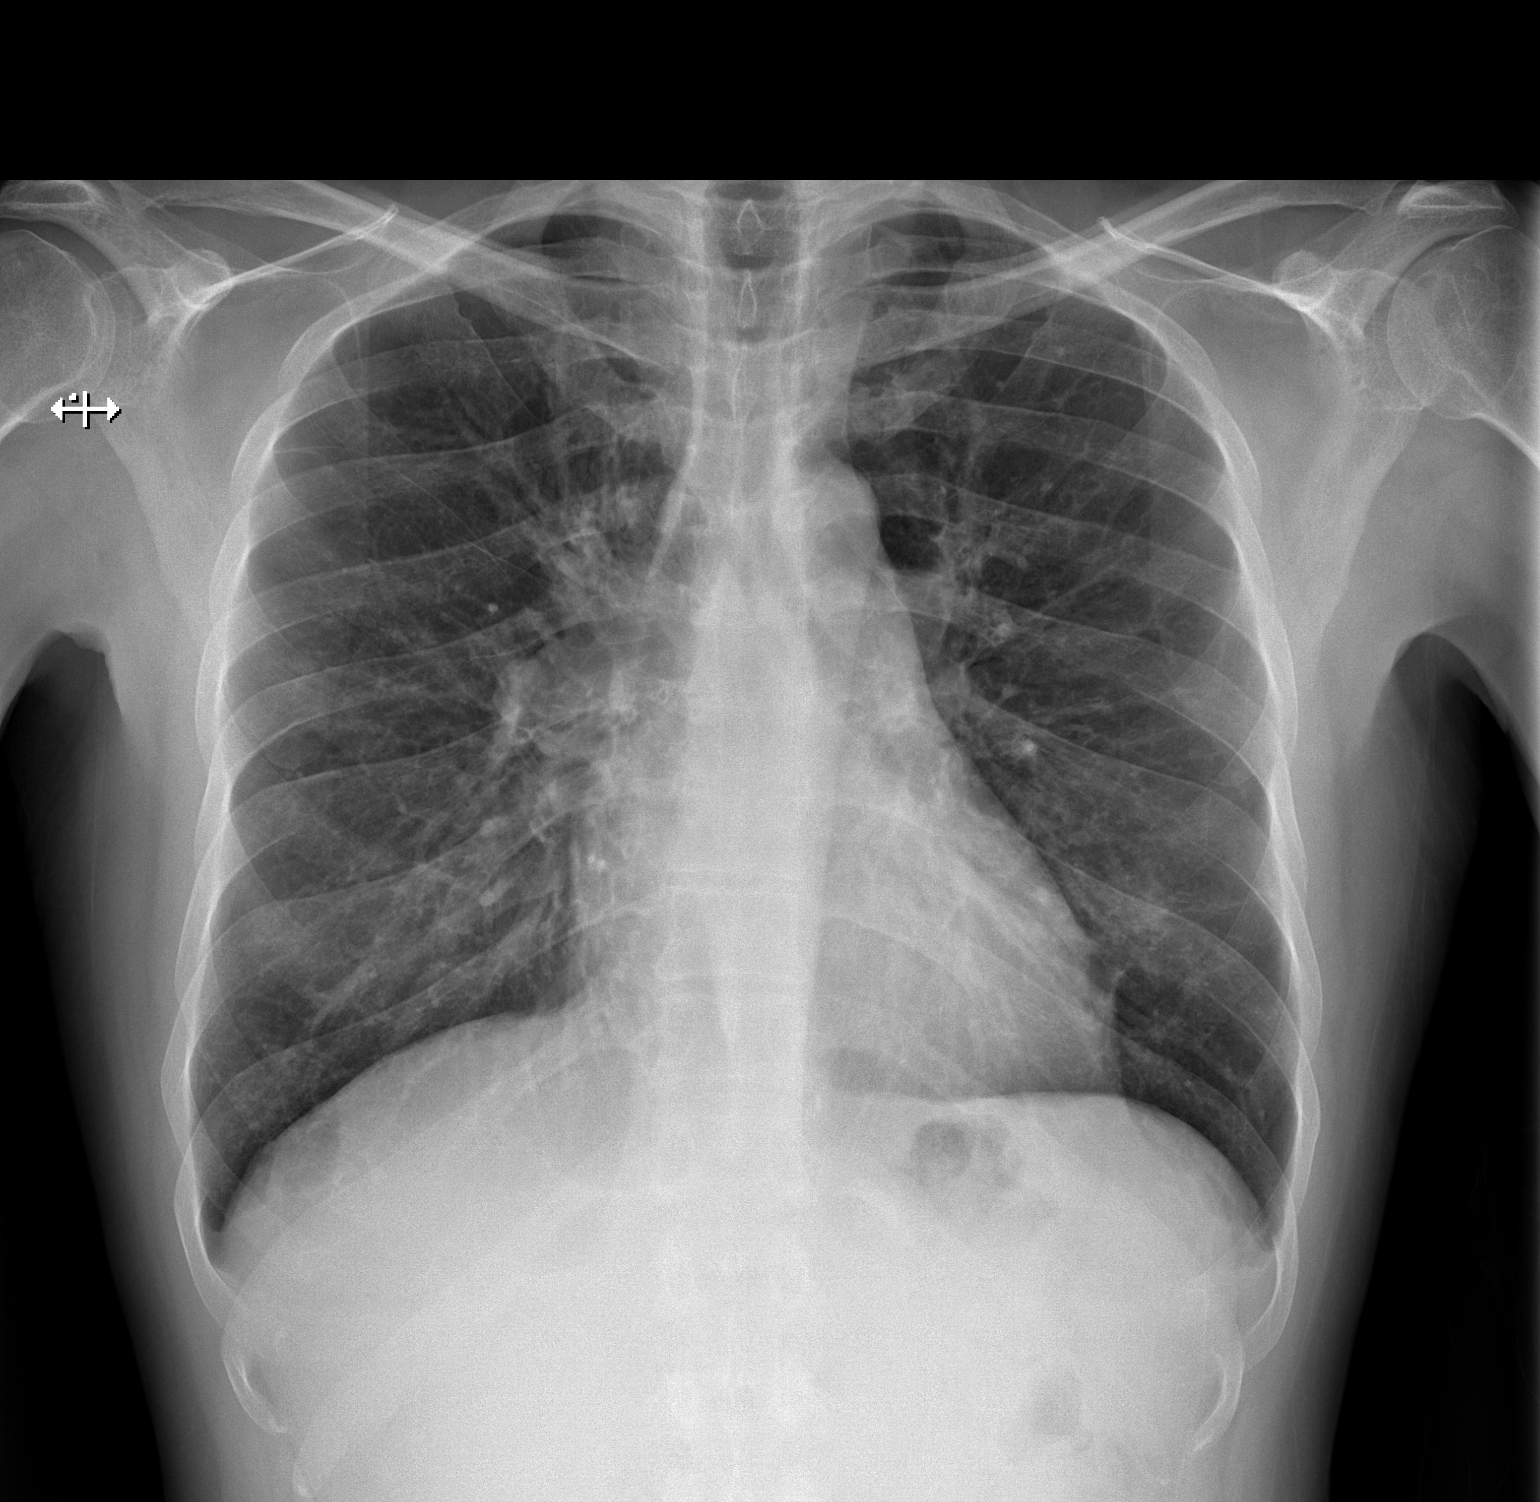

[w chest lat]
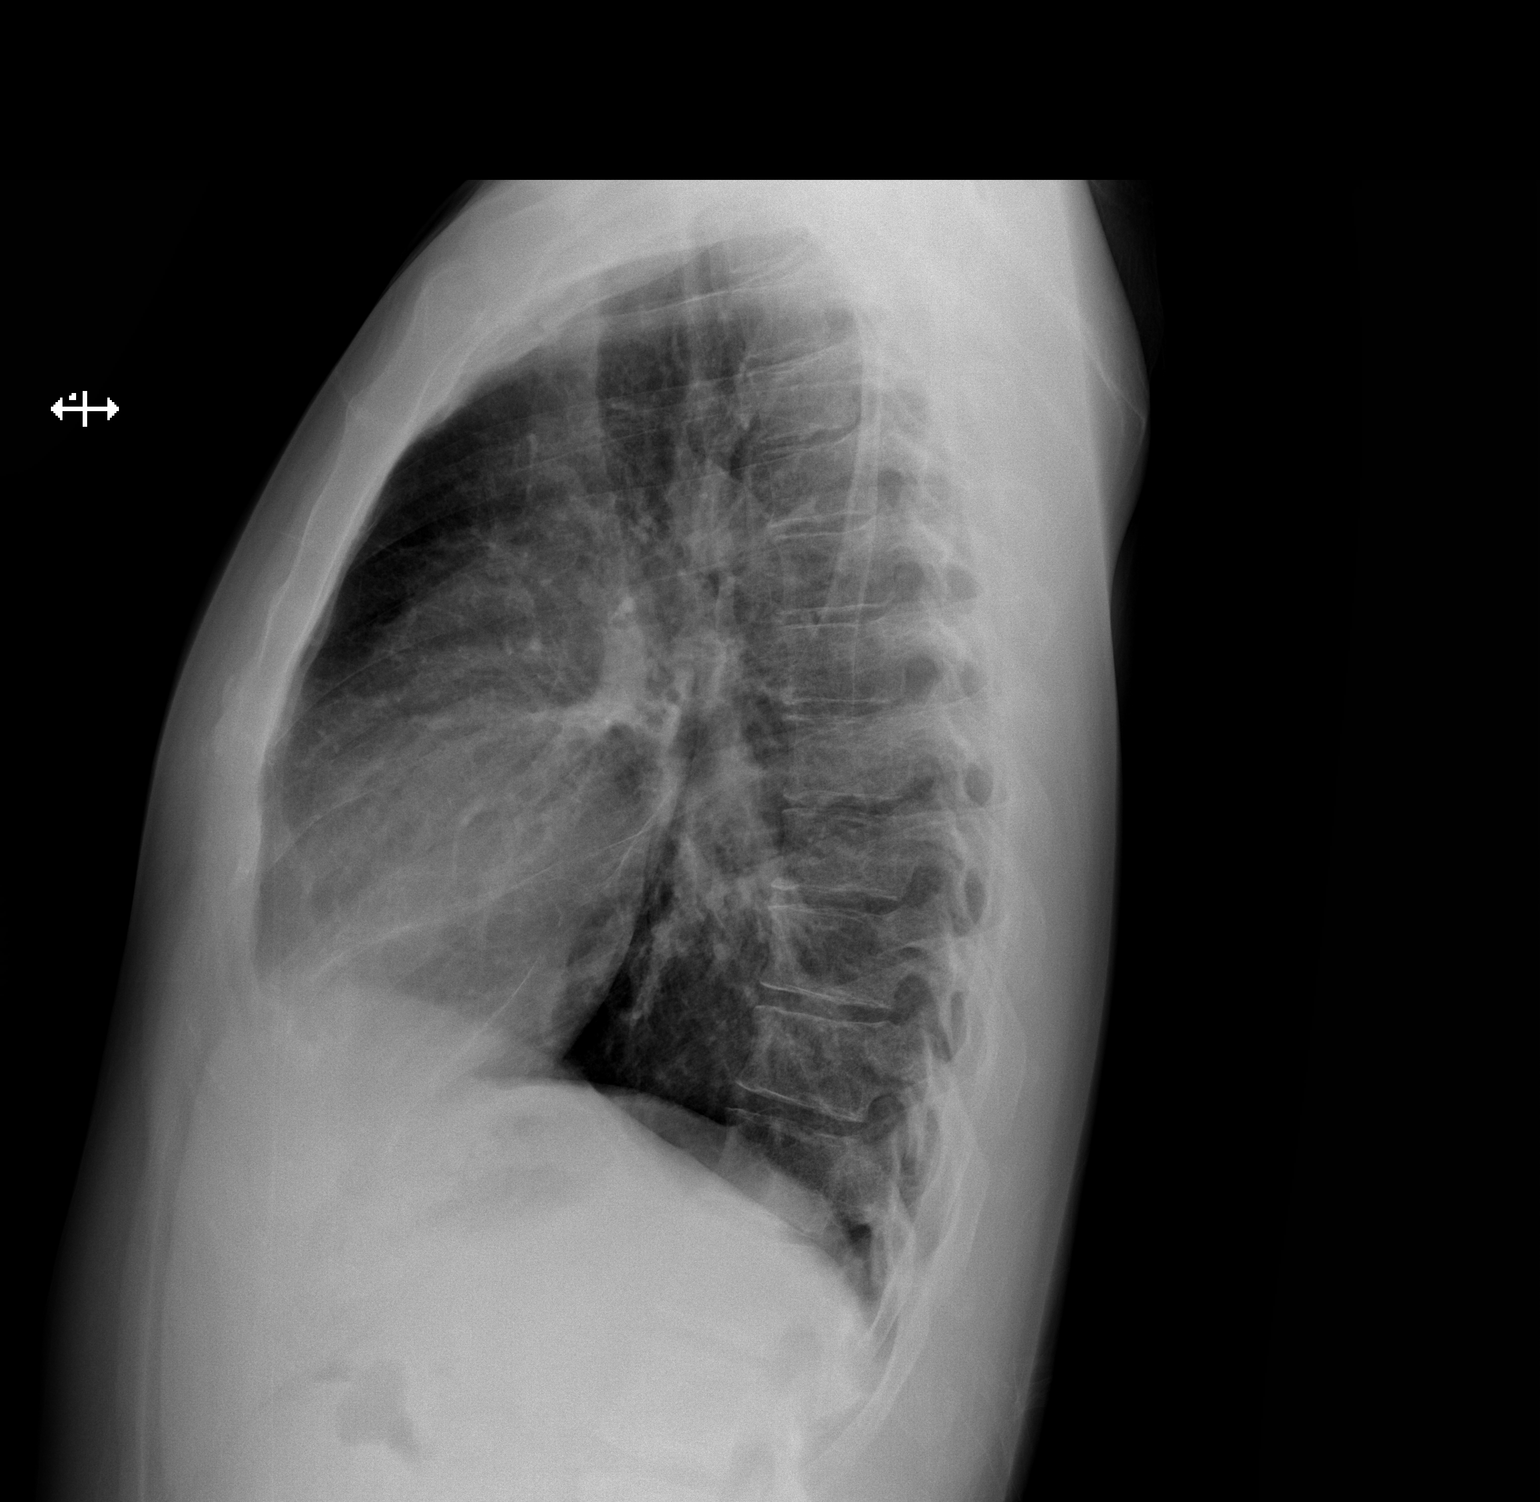

[2 of 2 positions shown; findings below may reference images not displayed]

FINDINGS: There is no edema or consolidation. Chronic central peribronchial
thickening noted. The heart size is normal. Prominence of the
central pulmonary arteries with rapid peripheral tapering is
indicative of a degree of pulmonary arterial hypertension. No
adenopathy is evident. No bone lesions.
IMPRESSION: Findings indicative of a degree of chronic hypertension. No edema or
consolidation. There is central peribronchial thickening consistent
with chronic bronchitis.

## 2018-04-25 DIAGNOSIS — Z114 Encounter for screening for human immunodeficiency virus [HIV]: Secondary | ICD-10-CM | POA: Diagnosis not present

## 2018-04-25 DIAGNOSIS — Z113 Encounter for screening for infections with a predominantly sexual mode of transmission: Secondary | ICD-10-CM | POA: Diagnosis not present

## 2019-04-08 ENCOUNTER — Other Ambulatory Visit: Payer: Self-pay | Admitting: Family

## 2019-04-08 DIAGNOSIS — R911 Solitary pulmonary nodule: Secondary | ICD-10-CM

## 2019-04-17 ENCOUNTER — Ambulatory Visit
Admission: RE | Admit: 2019-04-17 | Discharge: 2019-04-17 | Disposition: A | Discharge status: Home / Self Care | Payer: Medicare Other | Source: Ambulatory Visit | Attending: Family | Admitting: Family

## 2019-04-17 DIAGNOSIS — R911 Solitary pulmonary nodule: Secondary | ICD-10-CM

## 2019-05-27 ENCOUNTER — Ambulatory Visit: Payer: Medicare Other | Admitting: Gastroenterology

## 2019-06-02 ENCOUNTER — Encounter: Payer: Self-pay | Admitting: Family

## 2020-01-27 ENCOUNTER — Encounter: Payer: Self-pay | Admitting: Family

## 2020-10-04 ENCOUNTER — Other Ambulatory Visit: Payer: Self-pay | Admitting: Family

## 2020-10-04 DIAGNOSIS — R0989 Other specified symptoms and signs involving the circulatory and respiratory systems: Secondary | ICD-10-CM

## 2020-10-14 ENCOUNTER — Ambulatory Visit
Admission: RE | Admit: 2020-10-14 | Discharge: 2020-10-14 | Disposition: A | Discharge status: Home / Self Care | Payer: Medicare Other | Source: Ambulatory Visit | Attending: Family | Admitting: Family

## 2020-10-14 DIAGNOSIS — R0989 Other specified symptoms and signs involving the circulatory and respiratory systems: Secondary | ICD-10-CM

## 2021-01-20 DIAGNOSIS — N189 Chronic kidney disease, unspecified: Secondary | ICD-10-CM | POA: Insufficient documentation

## 2021-06-22 DIAGNOSIS — D509 Iron deficiency anemia, unspecified: Secondary | ICD-10-CM | POA: Insufficient documentation

## 2021-06-24 DIAGNOSIS — D631 Anemia in chronic kidney disease: Secondary | ICD-10-CM | POA: Insufficient documentation

## 2022-02-26 ENCOUNTER — Encounter (HOSPITAL_COMMUNITY): Payer: Self-pay

## 2022-02-26 ENCOUNTER — Emergency Department (HOSPITAL_COMMUNITY)
Admission: EM | Admit: 2022-02-26 | Discharge: 2022-02-26 | Disposition: A | Discharge status: Home / Self Care | Payer: Medicare Other | Attending: Emergency Medicine | Admitting: Emergency Medicine

## 2022-02-26 ENCOUNTER — Emergency Department (HOSPITAL_COMMUNITY): Payer: Medicare Other

## 2022-02-26 ENCOUNTER — Other Ambulatory Visit: Payer: Self-pay

## 2022-02-26 DIAGNOSIS — Z79899 Other long term (current) drug therapy: Secondary | ICD-10-CM | POA: Insufficient documentation

## 2022-02-26 DIAGNOSIS — T8249XA Other complication of vascular dialysis catheter, initial encounter: Secondary | ICD-10-CM | POA: Insufficient documentation

## 2022-02-26 DIAGNOSIS — I120 Hypertensive chronic kidney disease with stage 5 chronic kidney disease or end stage renal disease: Secondary | ICD-10-CM | POA: Diagnosis not present

## 2022-02-26 DIAGNOSIS — Y733 Surgical instruments, materials and gastroenterology and urology devices (including sutures) associated with adverse incidents: Secondary | ICD-10-CM | POA: Insufficient documentation

## 2022-02-26 DIAGNOSIS — Z992 Dependence on renal dialysis: Secondary | ICD-10-CM | POA: Diagnosis not present

## 2022-02-26 DIAGNOSIS — N186 End stage renal disease: Secondary | ICD-10-CM | POA: Insufficient documentation

## 2022-02-26 DIAGNOSIS — Z7951 Long term (current) use of inhaled steroids: Secondary | ICD-10-CM | POA: Insufficient documentation

## 2022-02-26 DIAGNOSIS — J449 Chronic obstructive pulmonary disease, unspecified: Secondary | ICD-10-CM | POA: Diagnosis not present

## 2022-02-26 DIAGNOSIS — T829XXA Unspecified complication of cardiac and vascular prosthetic device, implant and graft, initial encounter: Secondary | ICD-10-CM

## 2022-02-26 HISTORY — DX: Disorder of kidney and ureter, unspecified: N28.9

## 2022-02-26 LAB — CBC WITH DIFFERENTIAL/PLATELET
Abs Immature Granulocytes: 0.01 10*3/uL (ref 0.00–0.07)
Basophils Absolute: 0 10*3/uL (ref 0.0–0.1)
Basophils Relative: 1 %
Eosinophils Absolute: 0.2 10*3/uL (ref 0.0–0.5)
Eosinophils Relative: 4 %
HCT: 34.3 % — ABNORMAL LOW (ref 39.0–52.0)
Hemoglobin: 10.4 g/dL — ABNORMAL LOW (ref 13.0–17.0)
Immature Granulocytes: 0 %
Lymphocytes Relative: 30 %
Lymphs Abs: 1.2 10*3/uL (ref 0.7–4.0)
MCH: 25.4 pg — ABNORMAL LOW (ref 26.0–34.0)
MCHC: 30.3 g/dL (ref 30.0–36.0)
MCV: 83.7 fL (ref 80.0–100.0)
Monocytes Absolute: 0.6 10*3/uL (ref 0.1–1.0)
Monocytes Relative: 15 %
Neutro Abs: 2 10*3/uL (ref 1.7–7.7)
Neutrophils Relative %: 50 %
Platelets: 242 10*3/uL (ref 150–400)
RBC: 4.1 MIL/uL — ABNORMAL LOW (ref 4.22–5.81)
RDW: 14.2 % (ref 11.5–15.5)
WBC: 4 10*3/uL (ref 4.0–10.5)
nRBC: 0 % (ref 0.0–0.2)

## 2022-02-26 LAB — BASIC METABOLIC PANEL
Anion gap: 8 (ref 5–15)
BUN: 72 mg/dL — ABNORMAL HIGH (ref 6–20)
CO2: 20 mmol/L — ABNORMAL LOW (ref 22–32)
Calcium: 9 mg/dL (ref 8.9–10.3)
Chloride: 114 mmol/L — ABNORMAL HIGH (ref 98–111)
Creatinine, Ser: 8.41 mg/dL — ABNORMAL HIGH (ref 0.61–1.24)
GFR, Estimated: 8 mL/min — ABNORMAL LOW (ref >60)
Glucose, Bld: 89 mg/dL (ref 70–99)
Potassium: 5.2 mmol/L — ABNORMAL HIGH (ref 3.5–5.1)
Sodium: 142 mmol/L (ref 135–145)

## 2022-02-26 MED ORDER — OXYCODONE-ACETAMINOPHEN 5-325 MG PO TABS
1.0000 | ORAL_TABLET | Freq: Once | ORAL | Status: AC
  Administered 2022-02-26: 1 via ORAL
  Filled 2022-02-26: qty 1

## 2022-02-26 MED ORDER — HYDROCODONE-ACETAMINOPHEN 5-325 MG PO TABS: 1.0000 | ORAL_TABLET | Freq: Four times a day (QID) | ORAL | 0 refills | Status: AC | PRN

## 2022-02-26 NOTE — ED Triage Notes (Signed)
Patient reports that he had a right chest CVC Perm cath placed on Friday and is having pain at the insertion site.  Patient denies SOB.

## 2022-02-26 NOTE — Discharge Instructions (Signed)
As we discussed, your work-up in the ER today was reassuring for acute abnormalities.  It does not appear that your catheter is displaced or infected, therefore I suspect that your pain is related to normal pain following the procedure.  I have given you a prescription for a short course of narcotic pain medication to help manage this at home.  Please only take this as prescribed as needed for severe pain.  Do not drive or operate heavy machinery after taking this medication.   Return if development of any new or worsening symptoms.

## 2022-02-26 NOTE — ED Provider Notes (Signed)
[facility] DEPT Provider Note   CSN: [id number] Arrival date & time: 02/26/22  1643     History  No chief complaint on file.   Jorge [name] is a 41 y.o. male.  Patient with medical history significant for asthma, COPD, HTN, lupus and chronic kidney disease/lupus nephritis who presents today with hemodialysis catheter pain. Patient states that he had a hemodialysis catheter placed in his right IJ on Friday 5/19 by Dr Earleen Newport at [facility] without complication. 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 He is scheduled to start hemodialysis tomorrow through this catheter for the first time. He states that since the procedure he has had significant pain at the site of catheter placement. He states that he was not sent home with any pain medication and was only told to take Tylenol which he has been taking without any pain relief. He states that he is unable to sleep due to pain. He denies any fevers, chills, shortness of breath, nausea, vomiting, or diarrhea.     The history is provided by the patient. No language interpreter was used.      Home Medications Prior to Admission medications   Medication Sig Start Date End Date Taking? Authorizing Provider  albuterol (PROVENTIL HFA;VENTOLIN HFA) 108 (90 Base) MCG/ACT inhaler Inhale 2 puffs into the lungs every 4 (four) hours as needed for wheezing or shortness of breath. 03/12/16   Lajean Saver, MD  amLODipine (NORVASC) 10 MG tablet Take 10 mg by mouth every morning.    [provider]  arformoterol (BROVANA) 15 MCG/2ML NEBU Take 2 mLs (15 mcg total) by nebulization 2 (two) times daily. DX code J47.9 01/06/16   Mannam, Hart Robinsons, MD  HYDROcodone-acetaminophen (NORCO/VICODIN) 5-325 MG tablet Take 1-2 tablets by mouth every 6 (six) hours as needed for moderate pain. 10/16/17   Cornett, [name], MD  hydroxychloroquine (PLAQUENIL) 200 MG tablet Take 400 mg by mouth  every morning.    [provider]  ibuprofen (ADVIL,MOTRIN) 800 MG tablet Take 1 tablet (800 mg total) by mouth every 8 (eight) hours as needed. 10/16/17   Erroll Luna, MD      Allergies    Patient has no known allergies.    Review of Systems   Review of Systems  Skin:  Positive for wound.  All other systems reviewed and are negative.  Physical Exam Updated Vital Signs BP (!) 155/110 (BP Location: Left Arm)   Pulse 94   Temp 98.1 F (36.7 C) (Oral)   Resp 20   Ht 5\' 8"  (1.727 m)   Wt 75.3 kg   SpO2 99%   BMI 25.24 kg/m  Physical Exam Vitals and nursing note reviewed.  Constitutional:      General: He is not in acute distress.    Appearance: Normal appearance. He is normal weight. He is not ill-appearing, toxic-appearing or diaphoretic.  HENT:     Head: Normocephalic and atraumatic.  Cardiovascular:     Rate and Rhythm: Normal rate and regular rhythm.     Pulses:          Radial pulses are 2+ on the right side and 2+ on the left side.     Heart sounds: Normal heart sounds.  Pulmonary:     Effort: Pulmonary effort is normal. No respiratory distress.     Breath sounds: Normal breath sounds.  Chest:     Comments: Hemodialysis catheter present in the right chest sutured in  place with overlying dressing in place. There is no overlying erythema, swelling, fluctuance, or induration. No drainage from the site. Tenderness to palpation over the insertion site Abdominal:     General: Abdomen is flat.     Palpations: Abdomen is soft.  Musculoskeletal:        General: Normal range of motion.     Cervical back: Normal range of motion and neck supple.     Comments: Full ROM intact to the right arm without difficulty or pain. Sensation intact throughout the right arm.  Skin:    General: Skin is warm and dry.  Neurological:     General: No focal deficit present.     Mental Status: He is alert.  Psychiatric:        Mood and Affect: Mood normal.        Behavior: Behavior  normal.    ED Results / Procedures / Treatments   Labs (all labs ordered are listed, but only abnormal results are displayed) Labs Reviewed  CBC WITH DIFFERENTIAL/PLATELET - Abnormal; Notable for the following components:      Result Value   RBC 4.10 (*)    Hemoglobin 10.4 (*)    HCT 34.3 (*)    MCH 25.4 (*)    All other components within normal limits  BASIC METABOLIC PANEL - Abnormal; Notable for the following components:   Potassium 5.2 (*)    Chloride 114 (*)    CO2 20 (*)    BUN 72 (*)    Creatinine, Ser 8.41 (*)    GFR, Estimated 8 (*)    All other components within normal limits    EKG None  Radiology DG Chest 2 View  Result Date: 02/26/2022 CLINICAL DATA:  Chest pain at PermCath placement site. EXAM: CHEST - 2 VIEW COMPARISON:  04/05/2021 and prior radiographs FINDINGS: A RIGHT IJ central venous catheter is noted with tip at the SUPERIOR cavoatrial junction. The cardiomediastinal silhouette is unremarkable. There is no evidence of focal airspace disease, pulmonary edema, suspicious pulmonary nodule/mass, pleural effusion, or pneumothorax. No acute bony abnormalities are identified. IMPRESSION: No evidence of acute cardiopulmonary disease. RIGHT central venous catheter with tip overlying the SUPERIOR cavoatrial junction. Electronically Signed   By: Margarette Canada M.D.   On: 02/26/2022 18:20    Procedures Procedures    Medications Ordered in ED Medications  oxyCODONE-acetaminophen (PERCOCET/ROXICET) 5-325 MG per tablet 1 tablet (1 tablet Oral Given 02/26/22 1849)    ED Course/ Medical Decision Making/ A&P                           Medical Decision Making Amount and/or Complexity of Data Reviewed Labs: ordered. Radiology: ordered.   This patient presents to the ED for concern of hemodialysis catheter pain, this involves an extensive number of treatment options, and is a complaint that carries with it a high risk of complications and morbidity.  The differential  diagnosis includes DVT, infection, misplaced catheter   Co morbidities that complicate the patient evaluation  Hx COPD, htn, lupus, ESRD   Additional history obtained:  Additional history obtained from IR note for hemodialysis catheter placement  Lab Tests:  I Ordered, and personally interpreted labs.  The pertinent results include:  No leukocytosis, anemia consistent with baseline due to ESRD. K 5.2, however patient scheduled for hemodialysis tomorrow. BUN 72, Cr 8.41 consistent with ESRD and unchanged from baseline seen with [facility]   Imaging Studies ordered:  I  ordered imaging studies including DG chest  I independently visualized and interpreted imaging which showed  No evidence of acute cardiopulmonary disease.  RIGHT central venous catheter with tip overlying the SUPERIOR cavoatrial junction. I agree with the radiologist interpretation   Problem List / ED Course / Critical interventions / Medication management  I ordered medication including Percocet for pain  Reevaluation of the patient after these medicines showed that the patient resolved I have reviewed the patients home medicines and have made adjustments as needed   Social Determinants of Health:  Patient with ESRD starting dialysis tomorrow   Test / Admission - Considered:  Patient presents today with hemodialysis catheter pain after placement on Friday.  He is afebrile, nontoxic-appearing, and in no acute distress with reassuring vital signs. Physical exam reveals a normal appearing catheter without signs of infection which appears to be normally placed. Placement confirmed by CXR. Labs without leukocytosis. K 5.2, stable for hemodialysis tomorrow.  After administration of Percocet, patient states that his pain has completely resolved.  Will send him home with a short course of narcotic pain medication for management at home.  I have personally reviewed PDMP and deemed patient a adequate candidate for this.   Patient is stable for discharge at this time, educated on red flag symptoms of prompt immediate return.  Discharged stable condition.  Findings and plan of care discussed with supervising physician Dr. Eulis Foster who is in agreement.    Final Clinical Impression(s) / ED Diagnoses Final diagnoses:  Complication associated with dialysis catheter    Rx / DC Orders ED Discharge Orders          Ordered    HYDROcodone-acetaminophen (NORCO/VICODIN) 5-325 MG tablet  Every 6 hours PRN        02/26/22 2008          An After Visit Summary was printed and given to the patient.     Nestor Lewandowsky 02/26/22 2010    Daleen Bo, MD 02/28/22 [phone]

## 2022-07-16 ENCOUNTER — Emergency Department (HOSPITAL_COMMUNITY)
Admission: EM | Admit: 2022-07-16 | Discharge: 2022-07-16 | Discharge status: Against Medical Advice | Payer: Medicare Other | Attending: Emergency Medicine | Admitting: Emergency Medicine

## 2022-07-16 ENCOUNTER — Encounter (HOSPITAL_COMMUNITY): Payer: Self-pay

## 2022-07-16 ENCOUNTER — Other Ambulatory Visit: Payer: Self-pay

## 2022-07-16 DIAGNOSIS — R079 Chest pain, unspecified: Secondary | ICD-10-CM | POA: Diagnosis not present

## 2022-07-16 DIAGNOSIS — M79601 Pain in right arm: Secondary | ICD-10-CM | POA: Diagnosis not present

## 2022-07-16 DIAGNOSIS — Z5321 Procedure and treatment not carried out due to patient leaving prior to being seen by health care provider: Secondary | ICD-10-CM | POA: Insufficient documentation

## 2022-07-16 DIAGNOSIS — Z992 Dependence on renal dialysis: Secondary | ICD-10-CM | POA: Insufficient documentation

## 2022-07-16 NOTE — ED Triage Notes (Addendum)
Pt states that he is having pain in his port (right chest) that shoots down his right arm x 2 days. Pt states that his next dialysis tx is Monday. Pt states that he has been twitching.

## 2023-08-24 ENCOUNTER — Emergency Department (HOSPITAL_COMMUNITY)
Admission: EM | Admit: 2023-08-24 | Discharge: 2023-08-24 | Discharge status: Against Medical Advice | Payer: Medicare HMO | Attending: Physician Assistant | Admitting: Physician Assistant

## 2023-08-24 DIAGNOSIS — R55 Syncope and collapse: Secondary | ICD-10-CM | POA: Insufficient documentation

## 2023-08-24 DIAGNOSIS — T82838A Hemorrhage of vascular prosthetic devices, implants and grafts, initial encounter: Secondary | ICD-10-CM | POA: Insufficient documentation

## 2023-08-24 DIAGNOSIS — Z5321 Procedure and treatment not carried out due to patient leaving prior to being seen by health care provider: Secondary | ICD-10-CM | POA: Insufficient documentation

## 2023-08-24 LAB — CBC
HCT: 36.2 % — ABNORMAL LOW (ref 39.0–52.0)
Hemoglobin: 11.1 g/dL — ABNORMAL LOW (ref 13.0–17.0)
MCH: 27.3 pg (ref 26.0–34.0)
MCHC: 30.7 g/dL (ref 30.0–36.0)
MCV: 88.9 fL (ref 80.0–100.0)
Platelets: 240 10*3/uL (ref 150–400)
RBC: 4.07 MIL/uL — ABNORMAL LOW (ref 4.22–5.81)
RDW: 13.3 % (ref 11.5–15.5)
WBC: 5 10*3/uL (ref 4.0–10.5)
nRBC: 0 % (ref 0.0–0.2)

## 2023-08-24 LAB — CBG MONITORING, ED: Glucose-Capillary: 89 mg/dL (ref 70–99)

## 2023-08-24 LAB — BASIC METABOLIC PANEL
Anion gap: 9 (ref 5–15)
BUN: 27 mg/dL — ABNORMAL HIGH (ref 6–20)
CO2: 28 mmol/L (ref 22–32)
Calcium: 9 mg/dL (ref 8.9–10.3)
Chloride: 102 mmol/L (ref 98–111)
Creatinine, Ser: 8.43 mg/dL — ABNORMAL HIGH (ref 0.61–1.24)
GFR, Estimated: 8 mL/min — ABNORMAL LOW (ref >60)
Glucose, Bld: 74 mg/dL (ref 70–99)
Potassium: 4.4 mmol/L (ref 3.5–5.1)
Sodium: 139 mmol/L (ref 135–145)

## 2023-08-24 NOTE — ED Provider Triage Note (Signed)
Emergency Medicine Provider Triage Evaluation Note  Jorge [name] , a 42 y.o. male  was evaluated in triage.  Pt complains of bleeding from dialysis graft. Pt reports passing out   Review of Systems  Positive: weakness Negative: fall  Physical Exam  BP 122/86   Pulse 89   Temp 98.5 F (36.9 C)   Resp 16   SpO2 100%  Gen:   Awake, no distress   Resp:  Normal effort  MSK:   Moves extremities without difficulty  Other:    Medical Decision Making  Medically screening exam initiated at 3:59 PM.  Appropriate orders placed.  Alexande Willever was informed that the remainder of the evaluation will be completed by another provider, this initial triage assessment does not replace that evaluation, and the importance of remaining in the ED until their evaluation is complete.     Elson Areas, [place] 08/24/23 1601

## 2023-08-24 NOTE — ED Triage Notes (Signed)
Completed 1 of 3 hours in dialysis when pt fistula infiltrated. Pt had a near syncopal episode. Seeing floaters/colors. Has not missed dialysis.   EMS VS: 146/100 84 HR 121 CBG 98% on RA

## 2023-08-24 NOTE — ED Notes (Signed)
Pt called for vitals, no answer 

## 2023-10-01 ENCOUNTER — Other Ambulatory Visit: Payer: Self-pay

## 2023-10-01 ENCOUNTER — Encounter (HOSPITAL_COMMUNITY): Payer: Self-pay

## 2023-10-01 ENCOUNTER — Emergency Department (HOSPITAL_COMMUNITY): Payer: Medicare HMO

## 2023-10-01 ENCOUNTER — Emergency Department (HOSPITAL_COMMUNITY)
Admission: EM | Admit: 2023-10-01 | Discharge: 2023-10-02 | Disposition: A | Discharge status: Home / Self Care | Payer: Medicare HMO | Attending: Emergency Medicine | Admitting: Emergency Medicine

## 2023-10-01 DIAGNOSIS — Z86718 Personal history of other venous thrombosis and embolism: Secondary | ICD-10-CM | POA: Diagnosis not present

## 2023-10-01 DIAGNOSIS — Y712 Prosthetic and other implants, materials and accessory cardiovascular devices associated with adverse incidents: Secondary | ICD-10-CM | POA: Insufficient documentation

## 2023-10-01 DIAGNOSIS — Z91158 Patient's noncompliance with renal dialysis for other reason: Secondary | ICD-10-CM | POA: Diagnosis not present

## 2023-10-01 DIAGNOSIS — T82838D Hemorrhage of vascular prosthetic devices, implants and grafts, subsequent encounter: Secondary | ICD-10-CM | POA: Diagnosis not present

## 2023-10-01 DIAGNOSIS — I120 Hypertensive chronic kidney disease with stage 5 chronic kidney disease or end stage renal disease: Secondary | ICD-10-CM | POA: Diagnosis not present

## 2023-10-01 DIAGNOSIS — Z992 Dependence on renal dialysis: Secondary | ICD-10-CM | POA: Diagnosis not present

## 2023-10-01 DIAGNOSIS — N186 End stage renal disease: Secondary | ICD-10-CM | POA: Diagnosis not present

## 2023-10-01 DIAGNOSIS — T8249XD Other complication of vascular dialysis catheter, subsequent encounter: Secondary | ICD-10-CM | POA: Diagnosis present

## 2023-10-01 DIAGNOSIS — J45909 Unspecified asthma, uncomplicated: Secondary | ICD-10-CM | POA: Insufficient documentation

## 2023-10-01 DIAGNOSIS — Z79899 Other long term (current) drug therapy: Secondary | ICD-10-CM | POA: Insufficient documentation

## 2023-10-01 DIAGNOSIS — T82838A Hemorrhage of vascular prosthetic devices, implants and grafts, initial encounter: Secondary | ICD-10-CM | POA: Diagnosis present

## 2023-10-01 DIAGNOSIS — M3214 Glomerular disease in systemic lupus erythematosus: Secondary | ICD-10-CM | POA: Diagnosis not present

## 2023-10-01 NOTE — ED Triage Notes (Signed)
PER EMS: pt is from home with c/o dialysis catheter placed today to right chest. It started to bleed when he moved his arm and hit it when taking off his shirt around 2030 tonight. It has now clotted, bleeding controlled per ems.   BP- 130/palp, HR-88, RR-16, 98%

## 2023-10-01 NOTE — ED Provider Notes (Signed)
[place] EMERGENCY DEPARTMENT AT [facility] Provider Note   CSN: [id number] Arrival date & time: 10/01/23  2126     History {Add pertinent medical, surgical, social history, OB history to HPI:1} Chief Complaint  Patient presents with   Vascular Access Problem    Jorge [name] is a 42 y.o. 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  Patient was discharged yesterday from outside hospital after left upper extremity aVF thrombus, had PermCath placed and femoral cath removed yesterday.  Patient presents this evening complaining of bleeding from his catheter site.  States that it started to bleed when he was moving his arm at home and accidentally hit it when taking off his shirt.  This happened around 2030 this evening.  Bleeding was controlled by EMS.  HPI     Home Medications Prior to Admission medications   Medication Sig Start Date End Date Taking? Authorizing Provider  albuterol (PROVENTIL HFA;VENTOLIN HFA) 108 (90 Base) MCG/ACT inhaler Inhale 2 puffs into the lungs every 4 (four) hours as needed for wheezing or shortness of breath. 03/12/16   Cathren Laine, MD  amLODipine (NORVASC) 10 MG tablet Take 10 mg by mouth every morning.    [provider]  arformoterol (BROVANA) 15 MCG/2ML NEBU Take 2 mLs (15 mcg total) by nebulization 2 (two) times daily. DX code J47.9 01/06/16   Mannam, Praveen, MD  AURYXIA 1 GM 210 MG(Fe) tablet Take 420 mg by mouth 3 (three) times daily. 06/28/22   [provider]  famotidine (PEPCID) 20 MG tablet Take 20 mg by mouth 2 (two) times daily. 07/10/22   [provider]  hydroxychloroquine (PLAQUENIL) 200 MG tablet Take 400 mg by mouth every morning.    [provider]  ibuprofen (ADVIL,MOTRIN) 800 MG tablet Take 1 tablet (800 mg total) by mouth every 8 (eight) hours as needed. 10/16/17   Cornett,  Maisie Fus, MD  labetalol (NORMODYNE) 100 MG tablet Take 100 mg by mouth 2 (two) times daily. 07/10/22   [provider]  montelukast (SINGULAIR) 10 MG tablet Take 10 mg by mouth daily. 07/10/22   [provider]      Allergies    Patient has no known allergies.    Review of Systems   Review of Systems  Physical Exam Updated Vital Signs BP (!) 121/91 (BP Location: Right Arm)   Pulse 86   Temp 98.6 F (37 C) (Oral)   Resp 18   SpO2 100%  Physical Exam Vitals and nursing note reviewed.  Constitutional:      Appearance: Normal appearance.  HENT:     Head: Normocephalic and atraumatic.  Eyes:     Conjunctiva/sclera: Conjunctivae normal.  Pulmonary:     Effort: Pulmonary effort is normal. No respiratory distress.  Chest:     Comments: Permcath present in right upper chest with bleeding under tegaderm, no obvious active hemorrhage Skin:    General: Skin is warm and dry.  Neurological:     Mental Status: He is alert.  Psychiatric:        Mood and Affect: Mood normal.        Behavior: Behavior normal.     ED Results / Procedures / Treatments   Labs (all labs ordered are listed, but only abnormal results are displayed) Labs Reviewed - No data to display  EKG None  Radiology No results found.  Procedures Procedures  {Document cardiac monitor,  telemetry assessment procedure when appropriate:1}  Medications Ordered in ED Medications - No data to display  ED Course/ Medical Decision Making/ A&P   {   Click here for ABCD2, HEART and other calculatorsREFRESH Note before signing :1}                              Medical Decision Making Amount and/or Complexity of Data Reviewed Radiology: ordered.   This patient is a 42 y.o. male  who presents to the ED for concern of bleeding from dialysis catheter.   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 Pertinent results include: Reviewed admission records from outside facility.  Patient was discharged earlier today after having PermCath placed and femoral Vas-Cath removed.  This was done in the setting of left upper extremity AVF thrombus and subsequent missed hemodialysis.  Per hospital records, patient is scheduled for dialysis tomorrow (12/24) and 12/27.  Physical Exam: Physical exam performed. The pertinent findings include: ***  Lab Tests/Imaging studies: I personally interpreted labs/imaging and the pertinent results include:  ***. ***I agree with the radiologist interpretation.  Cardiac monitoring: EKG obtained and interpreted by myself and attending physician which shows: ***   Medications: I ordered medication including ***.  I have reviewed the patients home medicines and have made adjustments as needed.   Disposition: After consideration of the diagnostic results and the patients response to treatment, I feel that *** .   ***emergency department workup does not suggest an emergent condition requiring admission or immediate intervention beyond what has been performed at this time. The plan is: ***. The patient is safe for discharge and has been instructed to return immediately for worsening symptoms, change in symptoms or any other concerns.  Final Clinical Impression(s) / ED Diagnoses Final diagnoses:  None    Rx / DC Orders ED Discharge Orders     None      Portions of this report may have been transcribed using voice recognition software. Every effort was made to ensure accuracy; however, inadvertent computerized transcription errors may be present.

## 2023-10-01 NOTE — ED Notes (Signed)
Upon assessment of the newly placed PICC line, bleeding was controlled. Blood has appeared to have accumulated under the transparent dressing. Patient reports that it hurts when he lifts his arm and at the right clavicle.

## 2023-10-01 NOTE — ED Notes (Signed)
When reassessing the site, bleeding was still noted. PA Roemhildt was notified regarding patient status.

## 2023-10-01 NOTE — ED Notes (Signed)
Dressing removed, area cleaned, and site assessed. Small amount of bleeding.

## 2023-10-01 NOTE — ED Notes (Signed)
Pt transported to xray 

## 2023-10-01 NOTE — ED Notes (Signed)
Site area cleaned, bleeding continues, Lauren Roemhildt PA order for pressure to be placed on site. Will be reassessed.

## 2023-10-02 ENCOUNTER — Encounter (HOSPITAL_COMMUNITY): Payer: Self-pay

## 2023-10-02 ENCOUNTER — Other Ambulatory Visit: Payer: Self-pay

## 2023-10-02 ENCOUNTER — Emergency Department (HOSPITAL_COMMUNITY)
Admission: EM | Admit: 2023-10-02 | Discharge: 2023-10-02 | Disposition: A | Discharge status: Home / Self Care | Payer: Medicare HMO | Source: Home / Self Care | Attending: Emergency Medicine | Admitting: Emergency Medicine

## 2023-10-02 DIAGNOSIS — T8249XD Other complication of vascular dialysis catheter, subsequent encounter: Secondary | ICD-10-CM | POA: Insufficient documentation

## 2023-10-02 DIAGNOSIS — Z79899 Other long term (current) drug therapy: Secondary | ICD-10-CM | POA: Insufficient documentation

## 2023-10-02 DIAGNOSIS — T82838D Hemorrhage of vascular prosthetic devices, implants and grafts, subsequent encounter: Secondary | ICD-10-CM

## 2023-10-02 NOTE — ED Provider Notes (Signed)
[place] EMERGENCY DEPARTMENT AT [facility] Provider Note   CSN: [id number] Arrival date & time: 10/02/23  0214     History  Chief Complaint  Patient presents with   Vascular Access Problem    Jorge [name] is a 42 y.o. male.  Patient return just after being discharged with continued bleeding from right chest wall PermCath site.  See previous note for full HPI.  HPI     Home Medications Prior to Admission medications   Medication Sig Start Date End Date Taking? Authorizing Provider  albuterol (PROVENTIL HFA;VENTOLIN HFA) 108 (90 Base) MCG/ACT inhaler Inhale 2 puffs into the lungs every 4 (four) hours as needed for wheezing or shortness of breath. 03/12/16   Cathren Laine, MD  amLODipine (NORVASC) 10 MG tablet Take 10 mg by mouth every morning.    [provider]  arformoterol (BROVANA) 15 MCG/2ML NEBU Take 2 mLs (15 mcg total) by nebulization 2 (two) times daily. DX code J47.9 01/06/16   Mannam, Praveen, MD  AURYXIA 1 GM 210 MG(Fe) tablet Take 420 mg by mouth 3 (three) times daily. 06/28/22   [provider]  famotidine (PEPCID) 20 MG tablet Take 20 mg by mouth 2 (two) times daily. 07/10/22   [provider]  hydroxychloroquine (PLAQUENIL) 200 MG tablet Take 400 mg by mouth every morning.    [provider]  ibuprofen (ADVIL,MOTRIN) 800 MG tablet Take 1 tablet (800 mg total) by mouth every 8 (eight) hours as needed. 10/16/17   Cornett, Maisie Fus, MD  labetalol (NORMODYNE) 100 MG tablet Take 100 mg by mouth 2 (two) times daily. 07/10/22   [provider]  montelukast (SINGULAIR) 10 MG tablet Take 10 mg by mouth daily. 07/10/22   [provider]      Allergies    Patient has no known allergies.    Review of Systems   Review of Systems  Skin:        Bleeding from right chest permcath  All other systems reviewed and are negative.   Physical Exam Updated Vital Signs SpO2 100%  Physical Exam Vitals and nursing note  reviewed.  Constitutional:      Appearance: Normal appearance.  HENT:     Head: Normocephalic and atraumatic.  Eyes:     Conjunctiva/sclera: Conjunctivae normal.  Pulmonary:     Effort: Pulmonary effort is normal. No respiratory distress.  Chest:     Comments: Ongoing oozing of blood from right upper chest permcath Skin:    General: Skin is warm and dry.  Neurological:     Mental Status: He is alert.  Psychiatric:        Mood and Affect: Mood normal.        Behavior: Behavior normal.     ED Results / Procedures / Treatments   Labs (all labs ordered are listed, but only abnormal results are displayed) Labs Reviewed - No data to display  EKG None  Radiology DG Chest Portable 1 View Result Date: 10/02/2023 CLINICAL DATA:  Right PermCath placement. Bleeding from access site. EXAM: PORTABLE CHEST 1 VIEW COMPARISON:  09/29/2023 FINDINGS: Right dialysis catheter in place with the tip at the cavoatrial junction. No pneumothorax. Heart and mediastinal contours are within normal limits. No focal opacities or effusions. No acute bony abnormality. IMPRESSION: Right dialysis catheter in expected position. No visible complicating feature. No acute cardiopulmonary disease. Electronically Signed   By: Charlett Nose M.D.   On: 10/02/2023 00:59    Procedures Procedures  Medications Ordered in ED Medications - No data to display  ED Course/ Medical Decision Making/ A&P                                 Medical Decision Making  Clot surrounding catheter removed, used wound seal powder and quick clot with pressure.  Dressed with bulky dressing at 0345.   Reevaluated at 0430, no evidence of ongoing bleeding. Will discharge to home.   Final Clinical Impression(s) / ED Diagnoses Final diagnoses:  Bleeding due to dialysis catheter placement, subsequent encounter    Rx / DC Orders ED Discharge Orders     None      Portions of this report may have been transcribed using voice  recognition software. Every effort was made to ensure accuracy; however, inadvertent computerized transcription errors may be present.    Jeanella Flattery 10/02/23 8416    Gilda Crease, MD 10/02/23 [phone]

## 2023-10-02 NOTE — ED Triage Notes (Signed)
Patient was discharged from [facility] for bleeding from dialysis catheter. Upon leaving bleeding started again, returned to [facility].

## 2023-10-02 NOTE — ED Notes (Signed)
Upon reassessment of the dialysis catheter site, no bleeding is noted at this time.

## 2023-10-02 NOTE — ED Notes (Signed)
PA Lorin at bedside redressed site.

## 2023-10-02 NOTE — ED Notes (Signed)
Removed dressing

## 2023-10-02 NOTE — Discharge Instructions (Signed)
We confirmed the placement of your catheter via chest x-ray.  We used medicated gauze to try to control the bleeding. Please keep this on until dialysis tomorrow.

## 2023-12-05 ENCOUNTER — Emergency Department (HOSPITAL_COMMUNITY)
Admission: EM | Admit: 2023-12-05 | Discharge: 2023-12-05 | Disposition: A | Discharge status: Home / Self Care | Payer: Medicare HMO | Attending: Emergency Medicine | Admitting: Emergency Medicine

## 2023-12-05 ENCOUNTER — Other Ambulatory Visit: Payer: Self-pay

## 2023-12-05 ENCOUNTER — Emergency Department (HOSPITAL_COMMUNITY): Payer: Medicare HMO

## 2023-12-05 ENCOUNTER — Encounter (HOSPITAL_COMMUNITY): Payer: Self-pay

## 2023-12-05 DIAGNOSIS — N3090 Cystitis, unspecified without hematuria: Secondary | ICD-10-CM | POA: Insufficient documentation

## 2023-12-05 DIAGNOSIS — R1030 Lower abdominal pain, unspecified: Secondary | ICD-10-CM | POA: Diagnosis present

## 2023-12-05 LAB — COMPREHENSIVE METABOLIC PANEL
ALT: 12 U/L (ref 0–44)
AST: 13 U/L — ABNORMAL LOW (ref 15–41)
Albumin: 3.7 g/dL (ref 3.5–5.0)
Alkaline Phosphatase: 70 U/L (ref 38–126)
Anion gap: 11 (ref 5–15)
BUN: 41 mg/dL — ABNORMAL HIGH (ref 6–20)
CO2: 23 mmol/L (ref 22–32)
Calcium: 9.2 mg/dL (ref 8.9–10.3)
Chloride: 103 mmol/L (ref 98–111)
Creatinine, Ser: 8.49 mg/dL — ABNORMAL HIGH (ref 0.61–1.24)
GFR, Estimated: 7 mL/min — ABNORMAL LOW (ref >60)
Glucose, Bld: 92 mg/dL (ref 70–99)
Potassium: 4.9 mmol/L (ref 3.5–5.1)
Sodium: 137 mmol/L (ref 135–145)
Total Bilirubin: 0.4 mg/dL (ref 0.0–1.2)
Total Protein: 8.8 g/dL — ABNORMAL HIGH (ref 6.5–8.1)

## 2023-12-05 LAB — URINALYSIS, ROUTINE W REFLEX MICROSCOPIC
Bacteria, UA: NONE SEEN
Bilirubin Urine: NEGATIVE
Glucose, UA: 50 mg/dL — AB
Hgb urine dipstick: NEGATIVE
Ketones, ur: NEGATIVE mg/dL
Leukocytes,Ua: NEGATIVE
Nitrite: NEGATIVE
Protein, ur: >=300 mg/dL — AB
Specific Gravity, Urine: 1.022 (ref 1.005–1.030)
pH: 8 (ref 5.0–8.0)

## 2023-12-05 LAB — CBC WITH DIFFERENTIAL/PLATELET
Abs Immature Granulocytes: 0.05 10*3/uL (ref 0.00–0.07)
Basophils Absolute: 0 10*3/uL (ref 0.0–0.1)
Basophils Relative: 1 %
Eosinophils Absolute: 0.1 10*3/uL (ref 0.0–0.5)
Eosinophils Relative: 1 %
HCT: 36.1 % — ABNORMAL LOW (ref 39.0–52.0)
Hemoglobin: 11.3 g/dL — ABNORMAL LOW (ref 13.0–17.0)
Immature Granulocytes: 1 %
Lymphocytes Relative: 14 %
Lymphs Abs: 1.2 10*3/uL (ref 0.7–4.0)
MCH: 27.9 pg (ref 26.0–34.0)
MCHC: 31.3 g/dL (ref 30.0–36.0)
MCV: 89.1 fL (ref 80.0–100.0)
Monocytes Absolute: 1.2 10*3/uL — ABNORMAL HIGH (ref 0.1–1.0)
Monocytes Relative: 15 %
Neutro Abs: 5.8 10*3/uL (ref 1.7–7.7)
Neutrophils Relative %: 68 %
Platelets: 288 10*3/uL (ref 150–400)
RBC: 4.05 MIL/uL — ABNORMAL LOW (ref 4.22–5.81)
RDW: 14.3 % (ref 11.5–15.5)
WBC: 8.3 10*3/uL (ref 4.0–10.5)
nRBC: 0 % (ref 0.0–0.2)

## 2023-12-05 LAB — LIPASE, BLOOD: Lipase: 32 U/L (ref 11–51)

## 2023-12-05 MED ORDER — HYDROMORPHONE HCL 1 MG/ML IJ SOLN
0.5000 mg | Freq: Once | INTRAMUSCULAR | Status: AC
  Administered 2023-12-05: 0.5 mg via INTRAVENOUS
  Filled 2023-12-05: qty 1

## 2023-12-05 MED ORDER — CEFPODOXIME PROXETIL 100 MG PO TABS: 100.0000 mg | ORAL_TABLET | Freq: Two times a day (BID) | ORAL | 0 refills | Status: AC

## 2023-12-05 MED ORDER — ONDANSETRON HCL 4 MG/2ML IJ SOLN
4.0000 mg | Freq: Once | INTRAMUSCULAR | Status: AC
  Administered 2023-12-05: 4 mg via INTRAVENOUS
  Filled 2023-12-05: qty 2

## 2023-12-05 MED ORDER — CEFPODOXIME PROXETIL 100 MG PO TABS: 100.0000 mg | ORAL_TABLET | Freq: Two times a day (BID) | ORAL | 0 refills | Status: DC

## 2023-12-05 MED ORDER — SODIUM CHLORIDE 0.9 % IV SOLN
2.0000 g | Freq: Once | INTRAVENOUS | Status: AC
  Administered 2023-12-05: 2 g via INTRAVENOUS
  Filled 2023-12-05: qty 20

## 2023-12-05 NOTE — Discharge Instructions (Addendum)
 I started you on antibiotics.  Please follow-up with your urologist in the office. Call your dialysis center to see when they can fit you back in.  Try to limit your fluid intake.

## 2023-12-05 NOTE — ED Triage Notes (Signed)
 Patient is here for evaluation of lower abdominal pain. Reports he went over his fluid intake yesterday and was up all night urinating. Reports lower abdominal pain since having to urinate so frequently. Has a dialysis appt at 11 this morning.

## 2023-12-05 NOTE — ED Provider Notes (Signed)
 [place] [facility] AT [facility] Provider Note   CSN: [id number] Arrival date & time: 12/05/23  4098     History  Chief Complaint  Patient presents with   Abdominal Pain    Jorge [name] is a 43 y.o. male.  43 yo M with a chief complaint of lower abdominal pain.  This been going on for about 12 hours.  Diffuse lower abdomen.  Nothing seems to make it better or worse.  No fevers no vomiting no diarrhea.  He thinks is due to increased fluid intake.  Drink more than normal and has had multiple urinary efforts.  Denies any obvious pain with urination.  He is on dialysis Monday Wednesday and Friday and has dialysis scheduled today.  He was having such discomfort that he called the dialysis center this morning and they encouraged him to come here to be evaluated first.   Abdominal Pain      Home Medications Prior to Admission medications   Medication Sig Start Date End Date Taking? Authorizing Provider  albuterol (PROVENTIL HFA;VENTOLIN HFA) 108 (90 Base) MCG/ACT inhaler Inhale 2 puffs into the lungs every 4 (four) hours as needed for wheezing or shortness of breath. 03/12/16   Cathren Laine, MD  amLODipine (NORVASC) 10 MG tablet Take 10 mg by mouth every morning.    [provider]  arformoterol (BROVANA) 15 MCG/2ML NEBU Take 2 mLs (15 mcg total) by nebulization 2 (two) times daily. DX code J47.9 01/06/16   Mannam, Praveen, MD  AURYXIA 1 GM 210 MG(Fe) tablet Take 420 mg by mouth 3 (three) times daily. 06/28/22   [provider]  cefpodoxime (VANTIN) 100 MG tablet Take 1 tablet (100 mg total) by mouth 2 (two) times daily for 7 days. 12/05/23 12/12/23  Melene Plan, DO  famotidine (PEPCID) 20 MG tablet Take 20 mg by mouth 2 (two) times daily. 07/10/22   [provider]  hydroxychloroquine (PLAQUENIL) 200 MG tablet Take 400 mg by mouth every morning.    [provider]  ibuprofen (ADVIL,MOTRIN) 800 MG tablet Take 1 tablet (800 mg total)  by mouth every 8 (eight) hours as needed. 10/16/17   Cornett, Maisie Fus, MD  labetalol (NORMODYNE) 100 MG tablet Take 100 mg by mouth 2 (two) times daily. 07/10/22   [provider]  montelukast (SINGULAIR) 10 MG tablet Take 10 mg by mouth daily. 07/10/22   [provider]      Allergies    Vancomycin    Review of Systems   Review of Systems  Gastrointestinal:  Positive for abdominal pain.    Physical Exam Updated Vital Signs BP (!) 180/127 (BP Location: Right Arm)   Pulse (!) 105   Temp 98 F (36.7 C) (Oral)   Resp 18   Ht 5\' 7"  (1.702 m)   Wt 65.3 kg   SpO2 95%   BMI 22.55 kg/m  Physical Exam Vitals and nursing note reviewed.  Constitutional:      Appearance: He is well-developed.  HENT:     Head: Normocephalic and atraumatic.  Eyes:     Pupils: Pupils are equal, round, and reactive to light.  Neck:     Vascular: No JVD.  Cardiovascular:     Rate and Rhythm: Normal rate and regular rhythm.     Heart sounds: No murmur heard.    No friction rub. No gallop.  Pulmonary:     Effort: No respiratory distress.     Breath sounds: No wheezing.  Abdominal:  General: There is no distension.     Tenderness: There is abdominal tenderness. There is no guarding or rebound.     Comments: Patient having significant pain to the lower abdomen diffusely without obvious focality.  Musculoskeletal:        General: Normal range of motion.     Cervical back: Normal range of motion and neck supple.  Skin:    Coloration: Skin is not pale.     Findings: No rash.  Neurological:     Mental Status: He is alert and oriented to person, place, and time.  Psychiatric:        Behavior: Behavior normal.     ED Results / Procedures / Treatments   Labs (all labs ordered are listed, but only abnormal results are displayed) Labs Reviewed  CBC WITH DIFFERENTIAL/PLATELET - Abnormal; Notable for the following components:      Result Value   RBC 4.05 (*)    Hemoglobin 11.3 (*)     HCT 36.1 (*)    Monocytes Absolute 1.2 (*)    All other components within normal limits  COMPREHENSIVE METABOLIC PANEL - Abnormal; Notable for the following components:   BUN 41 (*)    Creatinine, Ser 8.49 (*)    Total Protein 8.8 (*)    AST 13 (*)    GFR, Estimated 7 (*)    All other components within normal limits  URINALYSIS, ROUTINE W REFLEX MICROSCOPIC - Abnormal; Notable for the following components:   APPearance HAZY (*)    Glucose, UA 50 (*)    Protein, ur >=300 (*)    All other components within normal limits  LIPASE, BLOOD    EKG None  Radiology CT Renal Stone Study Result Date: 12/05/2023 CLINICAL DATA:  Abdominal/flank pain, stone suspected. EXAM: CT ABDOMEN AND PELVIS WITHOUT CONTRAST TECHNIQUE: Multidetector CT imaging of the abdomen and pelvis was performed following the standard protocol without IV contrast. RADIATION DOSE REDUCTION: This exam was performed according to the departmental dose-optimization program which includes automated exposure control, adjustment of the mA and/or kV according to patient size and/or use of iterative reconstruction technique. COMPARISON:  CT scan abdomen and pelvis from 03/10/2015. FINDINGS: Lower chest: There is focal peripheral airspace opacity in the right lung lower lobe medially, which may represent scarring/atelectasis. There are additional heterogeneous ground-glass opacities in the right lung lower lobe, which is nonspecific and may represent sequela of infection or inflammation. Correlate clinically. There are emphysematous changes in the visualized bilateral lung bases. No suspicious mass or pleural effusion. The heart is normal in size. No pericardial effusion. Hepatobiliary: The liver is normal in size. Non-cirrhotic configuration. No suspicious mass. No intrahepatic or extrahepatic bile duct dilation. No calcified gallstones. Normal gallbladder wall thickness. No pericholecystic inflammatory changes. Pancreas: Unremarkable. No  pancreatic ductal dilatation or surrounding inflammatory changes. Spleen: Within normal limits. No focal lesion. Adrenals/Urinary Tract: Adrenal glands are unremarkable. No suspicious renal mass within the limitations of this unenhanced exam. Bilateral kidneys exhibit mild diffuse cortical atrophy. No nephroureterolithiasis. There is bilateral mild to moderate hydronephrosis and hydroureter. There is unusual elongated appearance of the urinary bladder which exhibit marked circumferential irregular wall thickening. There is marked perivesical fat stranding and fluid. No discrete urinary bladder calculus seen. Findings favor extensive cystitis. Correlate clinically and with urinalysis. Stomach/Bowel: No disproportionate dilation of the small or large bowel loops. No evidence of abnormal bowel wall thickening or inflammatory changes. The appendix is unremarkable. Vascular/Lymphatic: There is trace ascites in the right paracolic  gutter and surrounding the urinary bladder. No pneumoperitoneum. No abdominal or pelvic lymphadenopathy, by size criteria. No aneurysmal dilation of the major abdominal arteries. Reproductive: Normal size prostate. Symmetric seminal vesicles. Other: There is a tiny fat containing umbilical hernia. The soft tissues and abdominal wall are otherwise unremarkable. Musculoskeletal: No suspicious osseous lesions. IMPRESSION: 1. There is unusual elongated appearance of the urinary bladder which exhibit marked circumferential irregular wall thickening and marked perivesical fat stranding/fluid. No urinary bladder calculus or focal mass seen. Findings favor extensive cystitis. Correlate clinically and with urinalysis. 2. There is bilateral mild to moderate hydronephrosis and hydroureter, likely secondary to obstruction at the level of ureterovesical junction due to cystitis. No nephroureterolithiasis on either side. Follow-up is recommended to document resolution of bladder wall thickening and exclude  underlying focal mass. 3. Multiple other nonacute observations, as described above. Electronically Signed   By: Jules Schick M.D.   On: 12/05/2023 09:53    Procedures Procedures    Medications Ordered in ED Medications  HYDROmorphone (DILAUDID) injection 0.5 mg (0.5 mg Intravenous Given 12/05/23 0931)  ondansetron (ZOFRAN) injection 4 mg (4 mg Intravenous Given 12/05/23 0933)  cefTRIAXone (ROCEPHIN) 2 g in sodium chloride 0.9 % 100 mL IVPB (0 g Intravenous Stopped 12/05/23 1131)    ED Course/ Medical Decision Making/ A&P                                 Medical Decision Making Amount and/or Complexity of Data Reviewed Labs: ordered. Radiology: ordered.  Risk Prescription drug management.   43 yo M with a chief complaint of lower abdominal pain.  Going on for about 12 hours.  Patient has quite a bit of discomfort on exam.  Will obtain lab testing CT imaging treat pain and reassess.  No leukocytosis, no acute anemia, no significant electrolyte abnormalities.  No change to baseline renal function.  UA without obvious concerning finding.  CT imaging is concerning for cystitis versus bladder mass.  I discussed this with urology on-call   Cam Sattenfield, on call on-call with Dr. Laverle Patter.  They reviewed the case.  As the patient is feeling better they thought okay to follow-up in clinic.  2:01 PM:  I have discussed the diagnosis/risks/treatment options with the patient.  Evaluation and diagnostic testing in the emergency department does not suggest an emergent condition requiring admission or immediate intervention beyond what has been performed at this time.  They will follow up with PCP. We also discussed returning to the ED immediately if new or worsening sx occur. We discussed the sx which are most concerning (e.g., sudden worsening pain, fever, inability to tolerate by mouth) that necessitate immediate return. Medications administered to the patient during their visit and any new  prescriptions provided to the patient are listed below.  Medications given during this visit Medications  HYDROmorphone (DILAUDID) injection 0.5 mg (0.5 mg Intravenous Given 12/05/23 0931)  ondansetron (ZOFRAN) injection 4 mg (4 mg Intravenous Given 12/05/23 0933)  cefTRIAXone (ROCEPHIN) 2 g in sodium chloride 0.9 % 100 mL IVPB (0 g Intravenous Stopped 12/05/23 1131)     The patient appears reasonably screen and/or stabilized for discharge and I doubt any other medical condition or other [facility] requiring further screening, evaluation, or treatment in the ED at this time prior to discharge.         Final Clinical Impression(s) / ED Diagnoses Final diagnoses:  Inflammation of bladder  Rx / DC Orders ED Discharge Orders          Ordered    cefpodoxime (VANTIN) 100 MG tablet  2 times daily,   Status:  Discontinued        12/05/23 1336    cefpodoxime (VANTIN) 100 MG tablet  2 times daily        12/05/23 1357              Melene Plan, DO 12/05/23 1401

## 2023-12-05 NOTE — ED Notes (Signed)
 Patient transported to CT

## 2023-12-05 NOTE — Consult Note (Signed)
 Urology Consult Note   Requesting Attending Physician:  Melene Plan, DO Service Providing Consult: Urology  Consulting Attending: Dr. Laverle Patter   Reason for Consult:  bladder wall thickening and hypogastric pain  HPI: Jorge [name] is seen in consultation for reasons noted above at the request of Melene Plan, DO.  Patient is a 43 year old male who was accompanied by his husband.  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  He does not have a previous urologic history.  He reports that they went out to dinner with some friends last night, before a 5 felt ill.  He also went over his fluid intake last night and was up all night with frequent urination.Marland Kitchen  He presented to [facility] emergency department for further assessment of epigastric abdominal pain.  Following a dose of Zofran and ceftriaxone, he reports resolution of symptoms.  His urinalysis was positive for microscopic hematuria.  [facility] was consulted to speak to this and his abnormal CT findings.  ------------------  Assessment:  43 y.o. male with abnormally shaped bladder with thickened wall and significant stranding.  Recommendations: # Bladder wall thickening/stranding # Urinary frequency # Microscopic hematuria  Urinalysis relatively unremarkable and not consistent with infectious process.  No bacteria present.  21-50 RBC/hpf.  This would warrant an outpatient cystoscopy for further workup.  Of note he did feel that he felt much better after Zofran and 1 dose of ceftriaxone.  CT finding shows abnormally shaped oblong bladder with thick walls and significant stranding surrounding.  PVR 28 mL.  Without evidence of infection or obstructive uropathy, etiology of these findings is unclear.  He is not feverish, hemodynamically stable and without leukocytosis.  We feel he is stable for discharge home and close follow-up in clinic.  [facility] [address] [place], [place] [address] [phone]   Case and plan discussed with Dr. Laverle Patter  Past Medical History: Past Medical History:  Diagnosis Date   Asthma    daily inhaler/neb.   Bronchopulmonary dysplasia    GERD (gastroesophageal reflux disease)    no current med.   Hypertension    states BP runs high, has been on meds. x 5-6 yrs.   Lupus    "kidneys, lungs, heart" 01-18-16 "no recent problems-no steroid use recently"   Renal disorder    Subcutaneous nodules 09/2017   bilat. thigh, left forearm, left axilla    Past Surgical History:  Past Surgical History:  Procedure Laterality Date   65 HOUR PH STUDY N/A 03/20/2016   Procedure: 24 HOUR PH STUDY;  Surgeon: Jeani Hawking, MD;  Location: [facility];  Service: Endoscopy;  Laterality: N/A;   DIALYSIS/PERMA CATHETER INSERTION     ESOPHAGEAL MANOMETRY N/A 03/20/2016   Procedure: ESOPHAGEAL MANOMETRY (EM);  Surgeon: Jeani Hawking, MD;  Location: [facility];  Service: Endoscopy;  Laterality: N/A;   ESOPHAGOGASTRODUODENOSCOPY (EGD) WITH ESOPHAGEAL DILATION  08/19/2012   ESOPHAGOGASTRODUODENOSCOPY (EGD) WITH PROPOFOL N/A 05/29/2014   Procedure: ESOPHAGOGASTRODUODENOSCOPY (EGD) WITH PROPOFOL;  Surgeon: Theda Belfast, MD;  Location: [facility];  Service: Endoscopy;  Laterality: N/A;   ESOPHAGOGASTRODUODENOSCOPY (EGD) WITH PROPOFOL N/A 07/24/2014   Procedure: ESOPHAGOGASTRODUODENOSCOPY (EGD) WITH PROPOFOL;  Surgeon: Theda Belfast, MD;  Location: [facility];  Service: Endoscopy;  Laterality: N/A;   ESOPHAGOGASTRODUODENOSCOPY (EGD) WITH PROPOFOL N/A 01/28/2016   Procedure: ESOPHAGOGASTRODUODENOSCOPY (EGD) WITH PROPOFOL;  Surgeon: Jeani Hawking, MD;  Location: [facility];  Service: Endoscopy;  Laterality:  N/A;   MASS EXCISION N/A 10/16/2017   Procedure: EXCISION MASS LEFT THIGH, RIGHT THIGH, LEFT FOREARM, LEFT AXILLA;  Surgeon: Harriette Bouillon, MD;  Location:  SURGERY  CENTER;  Service: General;  Laterality: N/A;   PH IMPEDANCE STUDY N/A 03/20/2016   Procedure: PH IMPEDANCE STUDY;  Surgeon: Jeani Hawking, MD;  Location: [facility];  Service: Endoscopy;  Laterality: N/A;   TONSILLECTOMY     WISDOM TOOTH EXTRACTION      Medication: No current facility-administered medications for this encounter.   Current Outpatient Medications  Medication Sig Dispense Refill   albuterol (PROVENTIL HFA;VENTOLIN HFA) 108 (90 Base) MCG/ACT inhaler Inhale 2 puffs into the lungs every 4 (four) hours as needed for wheezing or shortness of breath. 1 Inhaler 1   amLODipine (NORVASC) 10 MG tablet Take 10 mg by mouth every morning.     arformoterol (BROVANA) 15 MCG/2ML NEBU Take 2 mLs (15 mcg total) by nebulization 2 (two) times daily. DX code J47.9 120 mL 5   AURYXIA 1 GM 210 MG(Fe) tablet Take 420 mg by mouth 3 (three) times daily.     famotidine (PEPCID) 20 MG tablet Take 20 mg by mouth 2 (two) times daily.     hydroxychloroquine (PLAQUENIL) 200 MG tablet Take 400 mg by mouth every morning.     ibuprofen (ADVIL,MOTRIN) 800 MG tablet Take 1 tablet (800 mg total) by mouth every 8 (eight) hours as needed. 30 tablet 0   labetalol (NORMODYNE) 100 MG tablet Take 100 mg by mouth 2 (two) times daily.     montelukast (SINGULAIR) 10 MG tablet Take 10 mg by mouth daily.      Allergies: Allergies  Allergen Reactions   Vancomycin Hives    Social History: Social History   Tobacco Use   Smoking status: Never   Smokeless tobacco: Never  Vaping Use   Vaping status: Never Used  Substance Use Topics   Alcohol use: No   Drug use: Not Currently    Types: Marijuana    Comment: last used 10/07/2017    Family History Family History  Problem Relation Age of Onset   Lupus Father    HIV Brother    Thyroid cancer Maternal Aunt     Review of Systems  Gastrointestinal:  Positive for abdominal pain and nausea.  Genitourinary:  Positive for frequency and urgency.     Objective    Vital signs in last 24 hours: BP (!) 180/127 (BP Location: Right Arm)   Pulse (!) 105   Temp 98 F (36.7 C) (Oral)   Resp 18   Ht 5\' 7"  (1.702 m)   Wt 65.3 kg   SpO2 95%   BMI 22.55 kg/m   Physical Exam General: A&O, resting, appropriate HEENT: [place]/AT Pulmonary: Normal work of breathing Cardiovascular: no cyanosis GU: Clear yellow urine Neuro: Appropriate, no focal neurological deficits  Most Recent Labs: Lab Results  Component Value Date   WBC 8.3 12/05/2023   HGB 11.3 (L) 12/05/2023   HCT 36.1 (L) 12/05/2023   PLT 288 12/05/2023    Lab Results  Component Value Date   NA 137 12/05/2023   K 4.9 12/05/2023   CL 103 12/05/2023   CO2 23 12/05/2023   BUN 41 (H) 12/05/2023   CREATININE 8.49 (H) 12/05/2023   CALCIUM 9.2 12/05/2023    No results found for: "INR", "APTT"   Urine Culture: @[id number] (laburin,org,[id number])@   IMAGING: CT Renal Stone Study Result Date: 12/05/2023 CLINICAL DATA:  Abdominal/flank pain, stone suspected.  EXAM: CT ABDOMEN AND PELVIS WITHOUT CONTRAST TECHNIQUE: Multidetector CT imaging of the abdomen and pelvis was performed following the standard protocol without IV contrast. RADIATION DOSE REDUCTION: This exam was performed according to the departmental dose-optimization program which includes automated exposure control, adjustment of the mA and/or kV according to patient size and/or use of iterative reconstruction technique. COMPARISON:  CT scan abdomen and pelvis from 03/10/2015. FINDINGS: Lower chest: There is focal peripheral airspace opacity in the right lung lower lobe medially, which may represent scarring/atelectasis. There are additional heterogeneous ground-glass opacities in the right lung lower lobe, which is nonspecific and may represent sequela of infection or inflammation. Correlate clinically. There are emphysematous changes in the visualized bilateral lung bases. No suspicious mass or pleural effusion. The heart is normal in  size. No pericardial effusion. Hepatobiliary: The liver is normal in size. Non-cirrhotic configuration. No suspicious mass. No intrahepatic or extrahepatic bile duct dilation. No calcified gallstones. Normal gallbladder wall thickness. No pericholecystic inflammatory changes. Pancreas: Unremarkable. No pancreatic ductal dilatation or surrounding inflammatory changes. Spleen: Within normal limits. No focal lesion. Adrenals/Urinary Tract: Adrenal glands are unremarkable. No suspicious renal mass within the limitations of this unenhanced exam. Bilateral kidneys exhibit mild diffuse cortical atrophy. No nephroureterolithiasis. There is bilateral mild to moderate hydronephrosis and hydroureter. There is unusual elongated appearance of the urinary bladder which exhibit marked circumferential irregular wall thickening. There is marked perivesical fat stranding and fluid. No discrete urinary bladder calculus seen. Findings favor extensive cystitis. Correlate clinically and with urinalysis. Stomach/Bowel: No disproportionate dilation of the small or large bowel loops. No evidence of abnormal bowel wall thickening or inflammatory changes. The appendix is unremarkable. Vascular/Lymphatic: There is trace ascites in the right paracolic gutter and surrounding the urinary bladder. No pneumoperitoneum. No abdominal or pelvic lymphadenopathy, by size criteria. No aneurysmal dilation of the major abdominal arteries. Reproductive: Normal size prostate. Symmetric seminal vesicles. Other: There is a tiny fat containing umbilical hernia. The soft tissues and abdominal wall are otherwise unremarkable. Musculoskeletal: No suspicious osseous lesions. IMPRESSION: 1. There is unusual elongated appearance of the urinary bladder which exhibit marked circumferential irregular wall thickening and marked perivesical fat stranding/fluid. No urinary bladder calculus or focal mass seen. Findings favor extensive cystitis. Correlate clinically and with  urinalysis. 2. There is bilateral mild to moderate hydronephrosis and hydroureter, likely secondary to obstruction at the level of ureterovesical junction due to cystitis. No nephroureterolithiasis on either side. Follow-up is recommended to document resolution of bladder wall thickening and exclude underlying focal mass. 3. Multiple other nonacute observations, as described above. Electronically Signed   By: Jules Schick M.D.   On: 12/05/2023 09:53    ------  Elmon Kirschner, NP Pager: [phone]   Please contact the urology consult pager with any further questions/concerns.

## 2024-01-14 ENCOUNTER — Other Ambulatory Visit: Payer: Self-pay

## 2024-01-14 ENCOUNTER — Emergency Department (HOSPITAL_COMMUNITY)
Admission: EM | Admit: 2024-01-14 | Discharge: 2024-01-14 | Disposition: A | Discharge status: Home / Self Care | Attending: Emergency Medicine | Admitting: Emergency Medicine

## 2024-01-14 ENCOUNTER — Emergency Department (HOSPITAL_COMMUNITY)

## 2024-01-14 ENCOUNTER — Encounter (HOSPITAL_COMMUNITY): Payer: Self-pay

## 2024-01-14 DIAGNOSIS — E875 Hyperkalemia: Secondary | ICD-10-CM | POA: Diagnosis not present

## 2024-01-14 DIAGNOSIS — K219 Gastro-esophageal reflux disease without esophagitis: Secondary | ICD-10-CM | POA: Insufficient documentation

## 2024-01-14 DIAGNOSIS — Z79899 Other long term (current) drug therapy: Secondary | ICD-10-CM | POA: Diagnosis not present

## 2024-01-14 DIAGNOSIS — Z992 Dependence on renal dialysis: Secondary | ICD-10-CM | POA: Insufficient documentation

## 2024-01-14 DIAGNOSIS — R200 Anesthesia of skin: Secondary | ICD-10-CM

## 2024-01-14 DIAGNOSIS — N186 End stage renal disease: Secondary | ICD-10-CM | POA: Diagnosis not present

## 2024-01-14 LAB — CBC WITH DIFFERENTIAL/PLATELET
Abs Immature Granulocytes: 0.14 10*3/uL — ABNORMAL HIGH (ref 0.00–0.07)
Basophils Absolute: 0 10*3/uL (ref 0.0–0.1)
Basophils Relative: 1 %
Eosinophils Absolute: 0.1 10*3/uL (ref 0.0–0.5)
Eosinophils Relative: 1 %
HCT: 36.2 % — ABNORMAL LOW (ref 39.0–52.0)
Hemoglobin: 11.1 g/dL — ABNORMAL LOW (ref 13.0–17.0)
Immature Granulocytes: 2 %
Lymphocytes Relative: 17 %
Lymphs Abs: 1.1 10*3/uL (ref 0.7–4.0)
MCH: 27.1 pg (ref 26.0–34.0)
MCHC: 30.7 g/dL (ref 30.0–36.0)
MCV: 88.5 fL (ref 80.0–100.0)
Monocytes Absolute: 0.7 10*3/uL (ref 0.1–1.0)
Monocytes Relative: 11 %
Neutro Abs: 4.5 10*3/uL (ref 1.7–7.7)
Neutrophils Relative %: 68 %
Platelets: 269 10*3/uL (ref 150–400)
RBC: 4.09 MIL/uL — ABNORMAL LOW (ref 4.22–5.81)
RDW: 14.2 % (ref 11.5–15.5)
WBC: 6.7 10*3/uL (ref 4.0–10.5)
nRBC: 0 % (ref 0.0–0.2)

## 2024-01-14 LAB — COMPREHENSIVE METABOLIC PANEL WITH GFR
ALT: 18 U/L (ref 0–44)
AST: 24 U/L (ref 15–41)
Albumin: 3.8 g/dL (ref 3.5–5.0)
Alkaline Phosphatase: 92 U/L (ref 38–126)
Anion gap: 13 (ref 5–15)
BUN: 39 mg/dL — ABNORMAL HIGH (ref 6–20)
CO2: 26 mmol/L (ref 22–32)
Calcium: 8.6 mg/dL — ABNORMAL LOW (ref 8.9–10.3)
Chloride: 98 mmol/L (ref 98–111)
Creatinine, Ser: 7.5 mg/dL — ABNORMAL HIGH (ref 0.61–1.24)
GFR, Estimated: 9 mL/min — ABNORMAL LOW (ref >60)
Glucose, Bld: 71 mg/dL (ref 70–99)
Potassium: 5.4 mmol/L — ABNORMAL HIGH (ref 3.5–5.1)
Sodium: 137 mmol/L (ref 135–145)
Total Bilirubin: 0.5 mg/dL (ref 0.0–1.2)
Total Protein: 9 g/dL — ABNORMAL HIGH (ref 6.5–8.1)

## 2024-01-14 LAB — I-STAT CHEM 8, ED
BUN: 55 mg/dL — ABNORMAL HIGH (ref 6–20)
Calcium, Ion: 0.96 mmol/L — ABNORMAL LOW (ref 1.15–1.40)
Chloride: 101 mmol/L (ref 98–111)
Creatinine, Ser: 8.1 mg/dL — ABNORMAL HIGH (ref 0.61–1.24)
Glucose, Bld: 73 mg/dL (ref 70–99)
HCT: 38 % — ABNORMAL LOW (ref 39.0–52.0)
Hemoglobin: 12.9 g/dL — ABNORMAL LOW (ref 13.0–17.0)
Potassium: 7.8 mmol/L (ref 3.5–5.1)
Sodium: 133 mmol/L — ABNORMAL LOW (ref 135–145)
TCO2: 29 mmol/L (ref 22–32)

## 2024-01-14 LAB — APTT: aPTT: 37 s — ABNORMAL HIGH (ref 24–36)

## 2024-01-14 LAB — PROTIME-INR
INR: 1.1 (ref 0.8–1.2)
Prothrombin Time: 14.5 s (ref 11.4–15.2)

## 2024-01-14 LAB — ETHANOL: Alcohol, Ethyl (B): <10 mg/dL (ref <10)

## 2024-01-14 MED ORDER — HYDROMORPHONE HCL 1 MG/ML IJ SOLN
0.5000 mg | Freq: Once | INTRAMUSCULAR | Status: AC
  Administered 2024-01-14: 0.5 mg via INTRAVENOUS
  Filled 2024-01-14: qty 1

## 2024-01-14 NOTE — Discharge Instructions (Signed)
 Workup here without any acute findings.  Head CT negative.  Suspect this may have been secondary to may be dehydration following dialysis.  Follow-up with dialysis as scheduled on Wednesday.  Return for any new or worse symptoms.  Make an appointment to follow-up with regular doctor.

## 2024-01-14 NOTE — ED Provider Notes (Addendum)
 [place] EMERGENCY DEPARTMENT AT [facility] Provider Note   CSN: [id number] Arrival date & time: 01/14/24  1600     History  Chief Complaint  Patient presents with   Numbness    Jorge [name] is a 43 y.o. male.  Patient is a dialysis patient normally dialyzed Monday Wednesday and Fridays.  Was at dialysis today did complete it.  But during dialysis he developed numbness to right upper extremity and right lower extremity.  No visual changes no speech speech problems no weakness no headache.  Patient is never had anything like this happen before.  Patient has a new fistula right arm and has a dialysis port that they are still using in the right anterior chest area.  Medical history significant for lupus hypertension end-stage renal disease on dialysis.  Has never used tobacco products.  Symptoms started at this point couple hours ago.       Home Medications Prior to Admission medications   Medication Sig Start Date End Date Taking? Authorizing Provider  albuterol (PROVENTIL HFA;VENTOLIN HFA) 108 (90 Base) MCG/ACT inhaler Inhale 2 puffs into the lungs every 4 (four) hours as needed for wheezing or shortness of breath. 03/12/16   Cathren Laine, MD  amLODipine (NORVASC) 10 MG tablet Take 10 mg by mouth every morning.    [provider]  arformoterol (BROVANA) 15 MCG/2ML NEBU Take 2 mLs (15 mcg total) by nebulization 2 (two) times daily. DX code J47.9 01/06/16   Mannam, Praveen, MD  AURYXIA 1 GM 210 MG(Fe) tablet Take 420 mg by mouth 3 (three) times daily. 06/28/22   [provider]  famotidine (PEPCID) 20 MG tablet Take 20 mg by mouth 2 (two) times daily. 07/10/22   [provider]  hydroxychloroquine (PLAQUENIL) 200 MG tablet Take 400 mg by mouth every morning.    [provider]  ibuprofen (ADVIL,MOTRIN) 800 MG tablet Take 1 tablet (800 mg total) by mouth every 8 (eight) hours as needed. 10/16/17   Cornett, Maisie Fus, MD  labetalol (NORMODYNE)  100 MG tablet Take 100 mg by mouth 2 (two) times daily. 07/10/22   [provider]  montelukast (SINGULAIR) 10 MG tablet Take 10 mg by mouth daily. 07/10/22   [provider]      Allergies    Vancomycin and Esomeprazole    Review of Systems   Review of Systems  Constitutional:  Negative for chills and fever.  HENT:  Negative for ear pain and sore throat.   Eyes:  Negative for pain and visual disturbance.  Respiratory:  Negative for cough and shortness of breath.   Cardiovascular:  Negative for chest pain and palpitations.  Gastrointestinal:  Negative for abdominal pain and vomiting.  Genitourinary:  Negative for dysuria and hematuria.  Musculoskeletal:  Negative for arthralgias and back pain.  Skin:  Negative for color change and rash.  Neurological:  Positive for numbness. Negative for seizures and syncope.  All other systems reviewed and are negative.   Physical Exam Updated Vital Signs BP (!) 148/90   Pulse 88   Temp 98.2 F (36.8 C) (Oral)   Resp (!) 21   Ht 1.702 m (5\' 7" )   Wt 65 kg   SpO2 99%   BMI 22.44 kg/m  Physical Exam Vitals and nursing note reviewed.  Constitutional:      General: He is not in acute distress.    Appearance: Normal appearance. He is well-developed.  HENT:     Head: Normocephalic and atraumatic.  Mouth/Throat:     Mouth: Mucous membranes are moist.  Eyes:     Extraocular Movements: Extraocular movements intact.     Conjunctiva/sclera: Conjunctivae normal.     Pupils: Pupils are equal, round, and reactive to light.  Cardiovascular:     Rate and Rhythm: Normal rate and regular rhythm.     Heart sounds: No murmur heard. Pulmonary:     Effort: Pulmonary effort is normal. No respiratory distress.     Breath sounds: Normal breath sounds.  Abdominal:     Palpations: Abdomen is soft.     Tenderness: There is no abdominal tenderness.  Musculoskeletal:        General: No swelling.     Cervical back: Normal range of  motion and neck supple.  Skin:    General: Skin is warm and dry.     Capillary Refill: Capillary refill takes less than 2 seconds.  Neurological:     General: No focal deficit present.     Mental Status: He is alert and oriented to person, place, and time.     Cranial Nerves: No cranial nerve deficit.     Sensory: Sensory deficit present.     Motor: No weakness.  Psychiatric:        Mood and Affect: Mood normal.     ED Results / Procedures / Treatments   Labs (all labs ordered are listed, but only abnormal results are displayed) Labs Reviewed  CBC WITH DIFFERENTIAL/PLATELET - Abnormal; Notable for the following components:      Result Value   RBC 4.09 (*)    Hemoglobin 11.1 (*)    HCT 36.2 (*)    Abs Immature Granulocytes 0.14 (*)    All other components within normal limits  COMPREHENSIVE METABOLIC PANEL WITH GFR - Abnormal; Notable for the following components:   Potassium 5.4 (*)    BUN 39 (*)    Creatinine, Ser 7.50 (*)    Calcium 8.6 (*)    Total Protein 9.0 (*)    GFR, Estimated 9 (*)    All other components within normal limits  APTT - Abnormal; Notable for the following components:   aPTT 37 (*)    All other components within normal limits  I-STAT CHEM 8, ED - Abnormal; Notable for the following components:   Sodium 133 (*)    Potassium 7.8 (*)    BUN 55 (*)    Creatinine, Ser 8.10 (*)    Calcium, Ion 0.96 (*)    Hemoglobin 12.9 (*)    HCT 38.0 (*)    All other components within normal limits  ETHANOL  PROTIME-INR    EKG EKG Interpretation Date/Time:  Monday January 14 2024 17:41:44 EDT Ventricular Rate:  81 PR Interval:  172 QRS Duration:  84 QT Interval:  405 QTC Calculation: 471 R Axis:   54  Text Interpretation: Sinus rhythm Anterior infarct, old No significant change since last tracing Confirmed by Vanetta Mulders [phone]) on 01/14/2024 6:35:41 PM  Radiology CT HEAD WO CONTRAST Result Date: 01/14/2024 CLINICAL DATA:  Numbness to the right upper  extremity and right lower extremity EXAM: CT HEAD WITHOUT CONTRAST TECHNIQUE: Contiguous axial images were obtained from the base of the skull through the vertex without intravenous contrast. RADIATION DOSE REDUCTION: This exam was performed according to the departmental dose-optimization program which includes automated exposure control, adjustment of the mA and/or kV according to patient size and/or use of iterative reconstruction technique. COMPARISON:  None Available. FINDINGS: Brain: No acute  territorial infarction, hemorrhage or intracranial mass. The ventricles are nonenlarged. Patchy white matter hypodensity most likely chronic small vessel ischemic change. Nonenlarged ventricles Vascular: No hyperdense vessels.  No unexpected calcification Skull: Normal. Negative for fracture or focal lesion. Sinuses/Orbits: No acute finding. Other: None IMPRESSION: 1. No CT evidence for acute intracranial abnormality. 2. Patchy white matter hypodensity most likely chronic small vessel ischemic change. Electronically Signed   By: Jasmine Pang M.D.   On: 01/14/2024 20:51    Procedures Procedures    Medications Ordered in ED Medications  HYDROmorphone (DILAUDID) injection 0.5 mg (0.5 mg Intravenous Given 01/14/24 1906)    ED Course/ Medical Decision Making/ A&P                                 Medical Decision Making Amount and/or Complexity of Data Reviewed Labs: ordered. Radiology: ordered.  Risk Prescription drug management.  Patient with right sided numbness.  Will get head CT will do code stroke order set.  But not a TNK candidate so code stroke not activated.  If head CT negative labs without explanation then patient will need MRI.  Patient went on to develop pain in the left upper extremity left lower extremity.  Is not consistent really with CVA.  Head CT was very delayed on getting results but was negative.  Patient treated with pain medicine which helped significantly.  Patient's i-STAT had  a potassium of 7.8.  However his complete metabolic panel with potassium of 5.4.  Rest of his labs without any significant abnormalities CBC white count was normal hemoglobin 11.1 platelets 269.  Patient stable for discharge home and follow-up for dialysis on Wednesday.  Patient symptoms may have been secondary to being a little dehydrated following the dialysis.  Feels much better now.  Final Clinical Impression(s) / ED Diagnoses Final diagnoses:  Numbness  ESRD (end stage renal disease) on dialysis [facility])    Rx / DC Orders ED Discharge Orders     None         Vanetta Mulders, MD 01/14/24 1738    Vanetta Mulders, MD 01/14/24 [phone]

## 2024-01-14 NOTE — ED Triage Notes (Signed)
 Patient report increase numbness to his left legs and  left arm after dialysis done 1 hour ago. Patient denies N/V.

## 2024-01-14 NOTE — ED Notes (Signed)
 Unable to obtain pts blood. 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

## 2024-08-05 NOTE — Progress Notes (Signed)
------------------------------   BASIC NOTE ------------------------------  Patient: Jorge [name] DOB: 12-29-1980 MRN: [id number] Note Author: ZEKAN, JEANNE M, MD Service Date: 08/04/2024  This patient was seen face-to-face for a basic visit as part of routine monthly dialysis care for end stage renal disease. Documentation of the visit is present in the dialysis unit or other medical record system.  Attending Nephrologist: ZEKAN, JEANNE M Dialysis Location: [facility] Schedule: M-W-F Shift: 2  ------------------------------ ADDITIONAL COMMENT ------------------------------  COMMENTS: Patient is seen at dialysis and has no complaints. Blood pressures are improved. He admits to feelings of depression and will contact his therapist.   Signed by: ZEKAN, JEANNE M, MD on 08/05/2024 at 06:36:10 PM Transcribed by: ZEKAN, JEANNE M, MD on 08/05/2024 at 06:36:10 PM

## 2024-08-26 NOTE — Progress Notes (Signed)
 Patient: Jorge [name] DOB: 12/01/1980 MRN: [id number] Note Type: Dialysis Rounds-Comp Attestation Service Date: 08/25/2024  This patient was seen face-to-face for a complete visit as part of routine monthly dialysis care for end stage renal disease. Documentation of the visit is present in the dialysis unit or other medical record system.  Attending Nephrologist: ZEKAN, JEANNE M Dialysis Location: [facility] Schedule: M-W-F Shift: 2  ------------------------------ ADDITIONAL COMMENT ------------------------------  COMMENTS: Patient is seen at dialysis and has no complaints. Blood pressures are improved. He admits to feelings of depression and will contact his therapist. He is scheduled to see surgery about additional access options. Labs reviewed with patient and he will increase cinecalcet to 90 mg q treatment. please see time V7 note.   Signed by: ZEKAN, JEANNE M, MD on 08/26/2024 at 93:74:41 PM Transcribed by: ZEKAN, JEANNE M, MD on 08/26/2024 at 06:25:58 PM

## 2024-09-07 NOTE — Progress Notes (Signed)
------------------------------   BASIC NOTE ------------------------------  Patient: Jorge [name] DOB: [birth year] MRN: [id number] Note Author: MICAELA ACIE LABOR, MD Service Date: 09/02/2024  This patient was personally seen face-to-face for a basic visit as part of routine monthly dialysis care for end stage renal disease.  Attending Nephrologist: ZEKAN, JEANNE M Dialysis Location: [facility] Schedule: M-W-F Shift: 2  ------------------------------ OVERVIEW ------------------------------  COMMENTS: Hemodialysis is ongoing and well tolerated.   ------------------------------ ADDITIONAL LABS ------------------------------  ALT (SGPT) 8 (09/29/23)  AST (SGOT) 27 (09/29/23)  ------------------------------ ADDITIONAL COMMENT ------------------------------  COMMENTS: Patient is seen at dialysis and has no complaints. Blood pressures are improved. He admits to feelings of depression and will contact his therapist. He is scheduled to see surgery about additional access options. Labs reviewed with patient and he will increase cinecalcet to 90 mg q treatment. please see time V7 note.   Signed by: ADEGOROYE, ADEYINKA A, MD on 09/07/2024 at 06:53:18 PM Transcribed by: MICAELA ACIE LABOR, MD on 09/07/2024 at 06:53:18 PM

## 2024-09-11 ENCOUNTER — Observation Stay (HOSPITAL_COMMUNITY)
Admission: EM | Admit: 2024-09-11 | Discharge: 2024-09-25 | DRG: 252 | Disposition: A | Attending: Family Medicine | Admitting: Family Medicine

## 2024-09-11 ENCOUNTER — Emergency Department (HOSPITAL_COMMUNITY)

## 2024-09-11 ENCOUNTER — Other Ambulatory Visit: Payer: Self-pay

## 2024-09-11 ENCOUNTER — Encounter (HOSPITAL_COMMUNITY): Payer: Self-pay

## 2024-09-11 DIAGNOSIS — T827XXA Infection and inflammatory reaction due to other cardiac and vascular devices, implants and grafts, initial encounter: Principal | ICD-10-CM | POA: Diagnosis present

## 2024-09-11 DIAGNOSIS — B9562 Methicillin resistant Staphylococcus aureus infection as the cause of diseases classified elsewhere: Secondary | ICD-10-CM | POA: Diagnosis present

## 2024-09-11 DIAGNOSIS — E875 Hyperkalemia: Secondary | ICD-10-CM | POA: Diagnosis present

## 2024-09-11 DIAGNOSIS — N2581 Secondary hyperparathyroidism of renal origin: Secondary | ICD-10-CM | POA: Diagnosis present

## 2024-09-11 DIAGNOSIS — Z8269 Family history of other diseases of the musculoskeletal system and connective tissue: Secondary | ICD-10-CM

## 2024-09-11 DIAGNOSIS — D631 Anemia in chronic kidney disease: Secondary | ICD-10-CM | POA: Diagnosis present

## 2024-09-11 DIAGNOSIS — Z888 Allergy status to other drugs, medicaments and biological substances status: Secondary | ICD-10-CM

## 2024-09-11 DIAGNOSIS — L03113 Cellulitis of right upper limb: Secondary | ICD-10-CM | POA: Diagnosis not present

## 2024-09-11 DIAGNOSIS — I953 Hypotension of hemodialysis: Secondary | ICD-10-CM | POA: Diagnosis present

## 2024-09-11 DIAGNOSIS — I330 Acute and subacute infective endocarditis: Secondary | ICD-10-CM | POA: Diagnosis present

## 2024-09-11 DIAGNOSIS — I1311 Hypertensive heart and chronic kidney disease without heart failure, with stage 5 chronic kidney disease, or end stage renal disease: Secondary | ICD-10-CM | POA: Diagnosis present

## 2024-09-11 DIAGNOSIS — Z992 Dependence on renal dialysis: Secondary | ICD-10-CM

## 2024-09-11 DIAGNOSIS — T82898A Other specified complication of vascular prosthetic devices, implants and grafts, initial encounter: Secondary | ICD-10-CM | POA: Diagnosis present

## 2024-09-11 DIAGNOSIS — I3139 Other pericardial effusion (noninflammatory): Secondary | ICD-10-CM | POA: Diagnosis present

## 2024-09-11 DIAGNOSIS — Z883 Allergy status to other anti-infective agents status: Secondary | ICD-10-CM

## 2024-09-11 DIAGNOSIS — T82848A Pain from vascular prosthetic devices, implants and grafts, initial encounter: Secondary | ICD-10-CM | POA: Diagnosis present

## 2024-09-11 DIAGNOSIS — Z79899 Other long term (current) drug therapy: Secondary | ICD-10-CM

## 2024-09-11 DIAGNOSIS — N186 End stage renal disease: Secondary | ICD-10-CM

## 2024-09-11 DIAGNOSIS — I97648 Postprocedural seroma of a circulatory system organ or structure following other circulatory system procedure: Secondary | ICD-10-CM | POA: Diagnosis present

## 2024-09-11 DIAGNOSIS — Y832 Surgical operation with anastomosis, bypass or graft as the cause of abnormal reaction of the patient, or of later complication, without mention of misadventure at the time of the procedure: Secondary | ICD-10-CM | POA: Diagnosis present

## 2024-09-11 DIAGNOSIS — I160 Hypertensive urgency: Secondary | ICD-10-CM | POA: Diagnosis present

## 2024-09-11 DIAGNOSIS — Z881 Allergy status to other antibiotic agents status: Secondary | ICD-10-CM

## 2024-09-11 DIAGNOSIS — M329 Systemic lupus erythematosus, unspecified: Secondary | ICD-10-CM | POA: Diagnosis present

## 2024-09-11 DIAGNOSIS — Z789 Other specified health status: Secondary | ICD-10-CM

## 2024-09-11 DIAGNOSIS — K219 Gastro-esophageal reflux disease without esophagitis: Secondary | ICD-10-CM | POA: Diagnosis present

## 2024-09-11 DIAGNOSIS — R7881 Bacteremia: Secondary | ICD-10-CM | POA: Diagnosis present

## 2024-09-11 DIAGNOSIS — Z5941 Food insecurity: Secondary | ICD-10-CM

## 2024-09-11 DIAGNOSIS — J45909 Unspecified asthma, uncomplicated: Secondary | ICD-10-CM | POA: Diagnosis present

## 2024-09-11 DIAGNOSIS — R1310 Dysphagia, unspecified: Secondary | ICD-10-CM | POA: Diagnosis present

## 2024-09-11 DIAGNOSIS — Z808 Family history of malignant neoplasm of other organs or systems: Secondary | ICD-10-CM

## 2024-09-11 DIAGNOSIS — D849 Immunodeficiency, unspecified: Secondary | ICD-10-CM | POA: Diagnosis present

## 2024-09-11 LAB — COMPREHENSIVE METABOLIC PANEL WITH GFR
ALT: 15 U/L (ref 0–44)
AST: 16 U/L (ref 15–41)
Albumin: 4 g/dL (ref 3.5–5.0)
Alkaline Phosphatase: 72 U/L (ref 38–126)
Anion gap: 14 (ref 5–15)
BUN: 52 mg/dL — ABNORMAL HIGH (ref 6–20)
CO2: 22 mmol/L (ref 22–32)
Calcium: 8.7 mg/dL — ABNORMAL LOW (ref 8.9–10.3)
Chloride: 103 mmol/L (ref 98–111)
Creatinine, Ser: 10.56 mg/dL — ABNORMAL HIGH (ref 0.61–1.24)
GFR, Estimated: 6 mL/min — ABNORMAL LOW (ref >60)
Glucose, Bld: 82 mg/dL (ref 70–99)
Potassium: 6.7 mmol/L (ref 3.5–5.1)
Sodium: 139 mmol/L (ref 135–145)
Total Bilirubin: 0.4 mg/dL (ref 0.0–1.2)
Total Protein: 9.3 g/dL — ABNORMAL HIGH (ref 6.5–8.1)

## 2024-09-11 LAB — CBC WITH DIFFERENTIAL/PLATELET
Abs Immature Granulocytes: 0.02 K/uL (ref 0.00–0.07)
Basophils Absolute: 0 K/uL (ref 0.0–0.1)
Basophils Relative: 0 %
Eosinophils Absolute: 0.1 K/uL (ref 0.0–0.5)
Eosinophils Relative: 1 %
HCT: 43.4 % (ref 39.0–52.0)
Hemoglobin: 13.4 g/dL (ref 13.0–17.0)
Immature Granulocytes: 0 %
Lymphocytes Relative: 21 %
Lymphs Abs: 1.4 K/uL (ref 0.7–4.0)
MCH: 27.2 pg (ref 26.0–34.0)
MCHC: 30.9 g/dL (ref 30.0–36.0)
MCV: 88.2 fL (ref 80.0–100.0)
Monocytes Absolute: 0.9 K/uL (ref 0.1–1.0)
Monocytes Relative: 14 %
Neutro Abs: 4 K/uL (ref 1.7–7.7)
Neutrophils Relative %: 64 %
Platelets: 197 K/uL (ref 150–400)
RBC: 4.92 MIL/uL (ref 4.22–5.81)
RDW: 14.1 % (ref 11.5–15.5)
WBC: 6.3 K/uL (ref 4.0–10.5)
nRBC: 0 % (ref 0.0–0.2)

## 2024-09-11 LAB — I-STAT CG4 LACTIC ACID, ED: Lactic Acid, Venous: 1.4 mmol/L (ref 0.5–1.9)

## 2024-09-11 MED ORDER — ALTEPLASE 2 MG IJ SOLR: 2.0000 mg | Freq: Once | INTRAMUSCULAR | Status: DC | PRN

## 2024-09-11 MED ORDER — SODIUM ZIRCONIUM CYCLOSILICATE 10 G PO PACK
10.0000 g | PACK | Freq: Once | ORAL | Status: AC
  Administered 2024-09-11: 10 g via ORAL
  Filled 2024-09-11: qty 1

## 2024-09-11 MED ORDER — LIDOCAINE-PRILOCAINE 2.5-2.5 % EX CREA: 1.0000 | TOPICAL_CREAM | CUTANEOUS | Status: DC | PRN

## 2024-09-11 MED ORDER — ALBUTEROL SULFATE (2.5 MG/3ML) 0.083% IN NEBU
10.0000 mg | INHALATION_SOLUTION | Freq: Once | RESPIRATORY_TRACT | Status: AC
  Administered 2024-09-11: 10 mg via RESPIRATORY_TRACT
  Filled 2024-09-11: qty 12

## 2024-09-11 MED ORDER — SODIUM CHLORIDE 0.9 % IV SOLN
2.0000 g | Freq: Once | INTRAVENOUS | Status: AC
  Administered 2024-09-11: 2 g via INTRAVENOUS
  Filled 2024-09-11: qty 20

## 2024-09-11 MED ORDER — HYDROMORPHONE HCL 1 MG/ML IJ SOLN
0.5000 mg | Freq: Once | INTRAMUSCULAR | Status: AC
  Administered 2024-09-11: 0.5 mg via INTRAVENOUS
  Filled 2024-09-11: qty 1

## 2024-09-11 MED ORDER — IOHEXOL 350 MG/ML SOLN
75.0000 mL | Freq: Once | INTRAVENOUS | Status: AC | PRN
  Administered 2024-09-11: 75 mL via INTRAVENOUS

## 2024-09-11 MED ORDER — HEPARIN SODIUM (PORCINE) 1000 UNIT/ML DIALYSIS
1000.0000 [IU] | INTRAMUSCULAR | Status: DC | PRN
  Administered 2024-09-12: 3200 [IU]
  Filled 2024-09-11: qty 1

## 2024-09-11 MED ORDER — PENTAFLUOROPROP-TETRAFLUOROETH EX AERO: 1.0000 | INHALATION_SPRAY | CUTANEOUS | Status: DC | PRN

## 2024-09-11 MED ORDER — INSULIN ASPART 100 UNIT/ML IV SOLN
5.0000 [IU] | Freq: Once | INTRAVENOUS | Status: AC
  Administered 2024-09-11: 5 [IU] via INTRAVENOUS
  Filled 2024-09-11: qty 5

## 2024-09-11 MED ORDER — CHLORHEXIDINE GLUCONATE CLOTH 2 % EX PADS
6.0000 | MEDICATED_PAD | Freq: Every day | CUTANEOUS | Status: DC
  Administered 2024-09-13 – 2024-09-17 (×4): 6 via TOPICAL

## 2024-09-11 MED ORDER — DEXTROSE 50 % IV SOLN
1.0000 | Freq: Once | INTRAVENOUS | Status: AC
  Administered 2024-09-11: 50 mL via INTRAVENOUS
  Filled 2024-09-11: qty 50

## 2024-09-11 MED ORDER — ANTICOAGULANT SODIUM CITRATE 4% (200MG/5ML) IV SOLN: 5.0000 mL | Status: DC | PRN

## 2024-09-11 MED ORDER — LINEZOLID 600 MG/300ML IV SOLN
600.0000 mg | Freq: Two times a day (BID) | INTRAVENOUS | Status: DC
  Administered 2024-09-12 – 2024-09-15 (×7): 600 mg via INTRAVENOUS
  Filled 2024-09-11 (×9): qty 300

## 2024-09-11 MED ORDER — LIDOCAINE HCL (PF) 1 % IJ SOLN: 5.0000 mL | INTRAMUSCULAR | Status: DC | PRN

## 2024-09-11 MED ORDER — ONDANSETRON HCL 4 MG/2ML IJ SOLN
4.0000 mg | Freq: Once | INTRAMUSCULAR | Status: AC
  Administered 2024-09-11: 4 mg via INTRAVENOUS
  Filled 2024-09-11: qty 2

## 2024-09-11 NOTE — ED Triage Notes (Signed)
 Pt has a AV fistula on his right upper arm that is not currently being used.  Pt woke up this am and has pain and swelling to this fistula.  Pt denies any injury/ trauma.

## 2024-09-11 NOTE — Progress Notes (Addendum)
 Brief note Informed of ESRD patient in ER. Presents with RUE pain + swelling. Dialyzes MWF via [facility] @ [facility]. K 6.7, s/p med mgmt. Will plan for dialysis tonight out of precaution if feasible as per census. Let us  know if patient is being admitted for formal consult. Please call with any questions/concerns in the interim.  Ephriam Stank, MD [facility]  11:09pm addendum: informed by Dr. Emil that patient is being admitted, will be seen in AM.

## 2024-09-11 NOTE — ED Provider Triage Note (Addendum)
 Emergency Medicine Provider Triage Evaluation Note  Jorge [name] , a 43 y.o. male  was evaluated in triage.  Pt complains of right arm pain and swelling.  The patient reports that he was not sure if he had a graft or fistula to the area but reports that it has not been used in 2 to 3 months.  He has been using his port in his chest for dialysis.  He reports that last night he had his quarter sized amount of swelling to the area and now it is worsening in pain and swelling.  No recorded fevers.  Denies any chest pain or shortness of breath.  Review of Systems  Positive:  Negative:   Physical Exam  BP (!) 182/125   Pulse 95   Temp 98 F (36.7 C)   Resp 18   Ht 5' 8 (1.727 m)   Wt 65.8 kg   SpO2 99%   BMI 22.05 kg/m  Gen:   Awake, no distress   Resp:  Normal effort  MSK:   Moves extremities without difficulty  Other:  Please see attached images on media tab.  Swelling to the medial aspect of the distal right humerus with overlying warmth and erythema.  Distal pulses palpable symmetrically.  No swelling noted to the shoulder or to the clavicle region.  No facial swelling or facial droop appreciated. Compartments are soft.   Medical Decision Making  Medically screening exam initiated at 6:45 PM.  Appropriate orders placed.  Jorge [name] was informed that the remainder of the evaluation will be completed by another provider, this initial triage assessment does not replace that evaluation, and the importance of remaining in the ED until their evaluation is complete.  I spoke with Dr. Magda with vascular.  Given that it is past time for ultrasound vascular to be done, he does recommend getting a CT scan.  It appears at [facility] that put him in a right arm graft about 6 months ago and he had a postop hematoma at the end so he was worried for infection.  He recommended ordering a CT scan with venous contrast.  If worrisome for infection, he should be admitted to internal medicine they will  see him in consult.   8:56 PM ADDENDUM - Potassium of 6.7. Spoke with Jorge [name], Structural. He will be going to room 27. I did order EKG, however he will further hyperkalemia treatment.   Jorge Ernst, PA-C 09/11/24 2047    Jorge Ernst, PA-C 09/11/24 2057

## 2024-09-11 NOTE — ED Provider Notes (Signed)
 [place] [facility] Provider Note   CSN: [id number] Arrival date & time: 09/11/24  1615     Patient presents with: Arm Problem (AV Fistula swelling)   Jorge [name] is a 43 y.o. male.   43 yo M with a chief complaints of right arm pain and swelling.  The patient states that he woke up and realized this today.  He has a dialysis access there that has not been used for about 2 or 3 months and has not had any real issues with it.  Noticed pain and swelling to the area.  Scheduled for dialysis in the morning.  Came here for evaluation.  No fevers no chest pain no difficulty breathing.        Prior to Admission medications   Medication Sig Start Date End Date Taking? Authorizing Provider  albuterol  (PROVENTIL  HFA;VENTOLIN  HFA) 108 (90 Base) MCG/ACT inhaler Inhale 2 puffs into the lungs every 4 (four) hours as needed for wheezing or shortness of breath. 03/12/16   Steinl, Kevin, MD  amLODipine  (NORVASC ) 10 MG tablet Take 10 mg by mouth every morning.    [provider]  arformoterol  (BROVANA ) 15 MCG/2ML NEBU Take 2 mLs (15 mcg total) by nebulization 2 (two) times daily. DX code J47.9 01/06/16   Mannam, Praveen, MD  AURYXIA 1 GM 210 MG(Fe) tablet Take 420 mg by mouth 3 (three) times daily. 06/28/22   [provider]  famotidine  (PEPCID ) 20 MG tablet Take 20 mg by mouth 2 (two) times daily. 07/10/22   [provider]  hydroxychloroquine  (PLAQUENIL ) 200 MG tablet Take 400 mg by mouth every morning.    [provider]  ibuprofen  (ADVIL ,MOTRIN ) 800 MG tablet Take 1 tablet (800 mg total) by mouth every 8 (eight) hours as needed. 10/16/17   Cornett, Debby, MD  labetalol  (NORMODYNE ) 100 MG tablet Take 100 mg by mouth 2 (two) times daily. 07/10/22   [provider]  montelukast (SINGULAIR) 10 MG tablet Take 10 mg by mouth daily. 07/10/22   [provider]    Allergies: Vancomycin and Esomeprazole    Review of  Systems  Updated Vital Signs BP (!) 149/108 (BP Location: Left Leg)   Pulse 90   Temp 97.8 F (36.6 C) (Oral)   Resp 20   Ht 5' 8 (1.727 m)   Wt 65.8 kg   SpO2 98%   BMI 22.05 kg/m   Physical Exam Vitals and nursing note reviewed.  Constitutional:      Appearance: He is well-developed.  HENT:     Head: Normocephalic and atraumatic.  Eyes:     Pupils: Pupils are equal, round, and reactive to light.  Neck:     Vascular: No JVD.  Cardiovascular:     Rate and Rhythm: Normal rate and regular rhythm.     Heart sounds: No murmur heard.    No friction rub. No gallop.  Pulmonary:     Effort: No respiratory distress.     Breath sounds: No wheezing.  Abdominal:     General: There is no distension.     Tenderness: There is no abdominal tenderness. There is no guarding or rebound.  Musculoskeletal:        General: Tenderness present. Normal range of motion.     Cervical back: Normal range of motion and neck supple.     Comments: Pain and swelling to the right upper arm just above the elbow along the medial aspect.  Skin:  Coloration: Skin is not pale.     Findings: No rash.  Neurological:     Mental Status: He is alert and oriented to person, place, and time.  Psychiatric:        Behavior: Behavior normal.     (all labs ordered are listed, but only abnormal results are displayed) Labs Reviewed  COMPREHENSIVE METABOLIC PANEL WITH GFR - Abnormal; Notable for the following components:      Result Value   Potassium 6.7 (*)    BUN 52 (*)    Creatinine, Ser 10.56 (*)    Calcium 8.7 (*)    Total Protein 9.3 (*)    GFR, Estimated 6 (*)    All other components within normal limits  CULTURE, BLOOD (ROUTINE X 2)  CULTURE, BLOOD (ROUTINE X 2)  CBC WITH DIFFERENTIAL/PLATELET  LACTIC ACID, PLASMA  LACTIC ACID, PLASMA  BASIC METABOLIC PANEL WITH GFR  HEPATITIS B SURFACE ANTIGEN  HEPATITIS B SURFACE ANTIBODY, QUANTITATIVE  I-STAT CG4 LACTIC ACID, ED    EKG: EKG  Interpretation Date/Time:  Thursday September 11 2024 21:20:00 EST Ventricular Rate:  90 PR Interval:  159 QRS Duration:  79 QT Interval:  379 QTC Calculation: 464 R Axis:   10  Text Interpretation: Sinus rhythm Probable anteroseptal infarct, old No significant change since last tracing Confirmed by Emil Share [phone]) on 09/11/2024 9:31:19 PM  Radiology: CT HUMERUS RIGHT W CONTRAST Result Date: 09/11/2024 CLINICAL DATA:  Soft tissue mass of upper arm. Recent graft placement. EXAM: CT OF THE UPPER RIGHT EXTREMITY WITH CONTRAST TECHNIQUE: Multidetector CT imaging of the upper right extremity was performed according to the standard protocol following intravenous contrast administration. RADIATION DOSE REDUCTION: This exam was performed according to the departmental dose-optimization program which includes automated exposure control, adjustment of the mA and/or kV according to patient size and/or use of iterative reconstruction technique. CONTRAST:  75mL OMNIPAQUE  IOHEXOL  350 MG/ML SOLN COMPARISON:  None Available. FINDINGS: There is no acute fracture or focal osseous lesion. There is no significant elbow or shoulder joint effusion. A graft is present connecting the left axillary vein to a vessel at the level of the distal humerus. Graft is grossly patent, but evaluation is limited secondary to timing of the contrast bolus. There is mild subcutaneous stranding surrounding the entire graft. Adjacent to the distal 2 cm of the graft there is an enhancing fluid collection measuring 2.7 x 2.2 by 2.8 cm. This abuts the graft. There is no contrast opacification within this collection. There is surrounding inflammatory stranding. Multiple superficial collateral vessels are seen along the right anterior chest wall and within the proximal right arm. There is a right upper lobe nodule measuring 5 mm on image 4/50. IMPRESSION: 1. 2.8 cm enhancing fluid collection adjacent to the distal 2 cm of the graft in the right  upper extremity. This is concerning for abscess. 2. Mild subcutaneous stranding surrounding the entire graft. 3. 5 mm right upper lobe pulmonary nodule. No follow-up needed if patient is low-risk.This recommendation follows the consensus statement: Guidelines for Management of Incidental Pulmonary Nodules Detected on CT Images: From the Fleischner Society 2017; Radiology 2017; 284:228-243. Electronically Signed   By: Greig Pique M.D.   On: 09/11/2024 23:11     .Critical Care  Performed by: Emil Share, DO Authorized by: Emil Share, DO   Critical care provider statement:    Critical care time (minutes):  35   Critical care time was exclusive of:  Separately billable procedures and treating other  patients   Critical care was time spent personally by me on the following activities:  Development of treatment plan with patient or surrogate, discussions with consultants, evaluation of patient's response to treatment, examination of patient, ordering and review of laboratory studies, ordering and review of radiographic studies, ordering and performing treatments and interventions, pulse oximetry, re-evaluation of patient's condition and review of old charts   Care discussed with: admitting provider      Medications Ordered in the ED  insulin aspart (novoLOG) injection 5 Units (has no administration in time range)    And  dextrose  50 % solution 50 mL (has no administration in time range)  sodium zirconium cyclosilicate (LOKELMA) packet 10 g (has no administration in time range)  Chlorhexidine  Gluconate Cloth 2 % PADS 6 each (has no administration in time range)  HYDROmorphone  (DILAUDID ) injection 0.5 mg (has no administration in time range)  ondansetron  (ZOFRAN ) injection 4 mg (has no administration in time range)  cefTRIAXone  (ROCEPHIN ) 2 g in sodium chloride  0.9 % 100 mL IVPB (has no administration in time range)  linezolid (ZYVOX) IVPB 600 mg (has no administration in time range)  albuterol   (PROVENTIL ) (2.5 MG/3ML) 0.083% nebulizer solution 10 mg (10 mg Nebulization Given 09/11/24 2129)  iohexol  (OMNIPAQUE ) 350 MG/ML injection 75 mL (75 mLs Intravenous Contrast Given 09/11/24 2256)    Clinical Course as of 09/11/24 2319  Thu Sep 11, 2024  1857 Consult made to vascular [RR]    Clinical Course User Index [RR] Bernis Ernst, PA-C                                 Medical Decision Making Risk OTC drugs. Prescription drug management.   43 yo M with a cc of pain and swelling to the right arm just above the elbow.  Woke up with it this morning.  No injury no fevers.  Patient was seen in the MSE process, case was discussed with vascular surgery on-call recommended CT imaging.  Patient's potassium is elevated.  Will temporize.  I discussed with nephrology.  Will attempt to arrange for dialysis tonight but appears it may be done tomorrow.   CT on my interpretation with fluid collection adjacent to the patient's graft site. Discussed with vascular, Dr. Marylouise, recommends IV antibiotics and vascular surgery to see in the morning.  The patients results and plan were reviewed and discussed.   Any x-rays performed were independently reviewed by myself.   Differential diagnosis were considered with the presenting HPI.  Medications  insulin aspart (novoLOG) injection 5 Units (has no administration in time range)    And  dextrose  50 % solution 50 mL (has no administration in time range)  sodium zirconium cyclosilicate (LOKELMA) packet 10 g (has no administration in time range)  Chlorhexidine  Gluconate Cloth 2 % PADS 6 each (has no administration in time range)  HYDROmorphone  (DILAUDID ) injection 0.5 mg (has no administration in time range)  ondansetron  (ZOFRAN ) injection 4 mg (has no administration in time range)  cefTRIAXone  (ROCEPHIN ) 2 g in sodium chloride  0.9 % 100 mL IVPB (has no administration in time range)  linezolid (ZYVOX) IVPB 600 mg (has no administration in time range)   albuterol  (PROVENTIL ) (2.5 MG/3ML) 0.083% nebulizer solution 10 mg (10 mg Nebulization Given 09/11/24 2129)  iohexol  (OMNIPAQUE ) 350 MG/ML injection 75 mL (75 mLs Intravenous Contrast Given 09/11/24 2256)    Vitals:   09/11/24 1618 09/11/24 1837 09/11/24 2112  09/11/24 2130  BP: (!) 182/125  (!) 149/108   Pulse: 95  90   Resp: 18  20   Temp: 98 F (36.7 C)  97.8 F (36.6 C)   TempSrc:   Oral   SpO2: 99%  98% 98%  Weight:  65.8 kg    Height:  5' 8 (1.727 m)      Final diagnoses:  Arteriovenous graft infection, initial encounter  Hyperkalemia    Admission/ observation were discussed with the admitting physician, patient and/or family and they are comfortable with the plan.       Final diagnoses:  Arteriovenous graft infection, initial encounter  Hyperkalemia    ED Discharge Orders     None          Emil Share, DO 09/11/24 2319

## 2024-09-11 NOTE — ED Triage Notes (Signed)
 Last full dialysis tx was Wednesday

## 2024-09-11 NOTE — ED Notes (Signed)
 Abnormal potassium level result reported to PA at triage .

## 2024-09-12 ENCOUNTER — Encounter (HOSPITAL_COMMUNITY): Admission: EM | Disposition: A | Payer: Self-pay | Source: Home / Self Care | Attending: Internal Medicine

## 2024-09-12 ENCOUNTER — Inpatient Hospital Stay (HOSPITAL_COMMUNITY)

## 2024-09-12 ENCOUNTER — Encounter (HOSPITAL_COMMUNITY): Payer: Self-pay | Admitting: Family Medicine

## 2024-09-12 ENCOUNTER — Observation Stay (HOSPITAL_COMMUNITY)

## 2024-09-12 ENCOUNTER — Other Ambulatory Visit: Payer: Self-pay

## 2024-09-12 DIAGNOSIS — T8249XA Other complication of vascular dialysis catheter, initial encounter: Secondary | ICD-10-CM | POA: Diagnosis not present

## 2024-09-12 DIAGNOSIS — N186 End stage renal disease: Secondary | ICD-10-CM

## 2024-09-12 DIAGNOSIS — I160 Hypertensive urgency: Secondary | ICD-10-CM | POA: Diagnosis not present

## 2024-09-12 DIAGNOSIS — Z992 Dependence on renal dialysis: Secondary | ICD-10-CM | POA: Diagnosis not present

## 2024-09-12 DIAGNOSIS — L02413 Cutaneous abscess of right upper limb: Secondary | ICD-10-CM | POA: Diagnosis not present

## 2024-09-12 DIAGNOSIS — T82848A Pain from vascular prosthetic devices, implants and grafts, initial encounter: Secondary | ICD-10-CM | POA: Diagnosis present

## 2024-09-12 DIAGNOSIS — I330 Acute and subacute infective endocarditis: Secondary | ICD-10-CM | POA: Diagnosis present

## 2024-09-12 DIAGNOSIS — N2581 Secondary hyperparathyroidism of renal origin: Secondary | ICD-10-CM | POA: Diagnosis present

## 2024-09-12 DIAGNOSIS — B957 Other staphylococcus as the cause of diseases classified elsewhere: Secondary | ICD-10-CM | POA: Diagnosis not present

## 2024-09-12 DIAGNOSIS — T82898A Other specified complication of vascular prosthetic devices, implants and grafts, initial encounter: Secondary | ICD-10-CM | POA: Diagnosis present

## 2024-09-12 DIAGNOSIS — Y832 Surgical operation with anastomosis, bypass or graft as the cause of abnormal reaction of the patient, or of later complication, without mention of misadventure at the time of the procedure: Secondary | ICD-10-CM | POA: Diagnosis present

## 2024-09-12 DIAGNOSIS — J45909 Unspecified asthma, uncomplicated: Secondary | ICD-10-CM | POA: Diagnosis present

## 2024-09-12 DIAGNOSIS — I3139 Other pericardial effusion (noninflammatory): Secondary | ICD-10-CM | POA: Diagnosis present

## 2024-09-12 DIAGNOSIS — L03113 Cellulitis of right upper limb: Secondary | ICD-10-CM

## 2024-09-12 DIAGNOSIS — R7881 Bacteremia: Secondary | ICD-10-CM | POA: Diagnosis present

## 2024-09-12 DIAGNOSIS — K219 Gastro-esophageal reflux disease without esophagitis: Secondary | ICD-10-CM | POA: Diagnosis present

## 2024-09-12 DIAGNOSIS — M329 Systemic lupus erythematosus, unspecified: Secondary | ICD-10-CM | POA: Diagnosis present

## 2024-09-12 DIAGNOSIS — I120 Hypertensive chronic kidney disease with stage 5 chronic kidney disease or end stage renal disease: Secondary | ICD-10-CM | POA: Diagnosis not present

## 2024-09-12 DIAGNOSIS — I953 Hypotension of hemodialysis: Secondary | ICD-10-CM | POA: Diagnosis present

## 2024-09-12 DIAGNOSIS — M7989 Other specified soft tissue disorders: Secondary | ICD-10-CM | POA: Diagnosis not present

## 2024-09-12 DIAGNOSIS — T827XXA Infection and inflammatory reaction due to other cardiac and vascular devices, implants and grafts, initial encounter: Secondary | ICD-10-CM | POA: Diagnosis present

## 2024-09-12 DIAGNOSIS — Z5941 Food insecurity: Secondary | ICD-10-CM | POA: Diagnosis not present

## 2024-09-12 DIAGNOSIS — D631 Anemia in chronic kidney disease: Secondary | ICD-10-CM | POA: Diagnosis present

## 2024-09-12 DIAGNOSIS — I1311 Hypertensive heart and chronic kidney disease without heart failure, with stage 5 chronic kidney disease, or end stage renal disease: Secondary | ICD-10-CM | POA: Diagnosis present

## 2024-09-12 DIAGNOSIS — E875 Hyperkalemia: Secondary | ICD-10-CM | POA: Diagnosis present

## 2024-09-12 DIAGNOSIS — D849 Immunodeficiency, unspecified: Secondary | ICD-10-CM | POA: Diagnosis present

## 2024-09-12 DIAGNOSIS — T827XXD Infection and inflammatory reaction due to other cardiac and vascular devices, implants and grafts, subsequent encounter: Secondary | ICD-10-CM | POA: Diagnosis not present

## 2024-09-12 DIAGNOSIS — I97648 Postprocedural seroma of a circulatory system organ or structure following other circulatory system procedure: Secondary | ICD-10-CM | POA: Diagnosis present

## 2024-09-12 DIAGNOSIS — B9562 Methicillin resistant Staphylococcus aureus infection as the cause of diseases classified elsewhere: Secondary | ICD-10-CM | POA: Diagnosis present

## 2024-09-12 DIAGNOSIS — Z881 Allergy status to other antibiotic agents status: Secondary | ICD-10-CM | POA: Diagnosis not present

## 2024-09-12 DIAGNOSIS — R1310 Dysphagia, unspecified: Secondary | ICD-10-CM | POA: Diagnosis present

## 2024-09-12 HISTORY — PX: PATCH ANGIOPLASTY: SHX6230

## 2024-09-12 HISTORY — PX: AVGG REMOVAL: SHX5153

## 2024-09-12 LAB — BLOOD CULTURE ID PANEL (REFLEXED) - BCID2

## 2024-09-12 LAB — CBC
HCT: 36.1 % — ABNORMAL LOW (ref 39.0–52.0)
HCT: 40.3 % (ref 39.0–52.0)
Hemoglobin: 10.9 g/dL — ABNORMAL LOW (ref 13.0–17.0)
Hemoglobin: 12.1 g/dL — ABNORMAL LOW (ref 13.0–17.0)
MCH: 27.1 pg (ref 26.0–34.0)
MCH: 27.3 pg (ref 26.0–34.0)
MCHC: 30 g/dL (ref 30.0–36.0)
MCHC: 30.2 g/dL (ref 30.0–36.0)
MCV: 90.3 fL (ref 80.0–100.0)
MCV: 90.4 fL (ref 80.0–100.0)
Platelets: 149 K/uL — ABNORMAL LOW (ref 150–400)
Platelets: 176 K/uL (ref 150–400)
RBC: 4 MIL/uL — ABNORMAL LOW (ref 4.22–5.81)
RBC: 4.46 MIL/uL (ref 4.22–5.81)
RDW: 14.1 % (ref 11.5–15.5)
RDW: 14.2 % (ref 11.5–15.5)
WBC: 5.4 K/uL (ref 4.0–10.5)
WBC: 7 K/uL (ref 4.0–10.5)
nRBC: 0 % (ref 0.0–0.2)
nRBC: 0 % (ref 0.0–0.2)

## 2024-09-12 LAB — RENAL FUNCTION PANEL
Albumin: 3.1 g/dL — ABNORMAL LOW (ref 3.5–5.0)
Anion gap: 15 (ref 5–15)
BUN: 57 mg/dL — ABNORMAL HIGH (ref 6–20)
CO2: 21 mmol/L — ABNORMAL LOW (ref 22–32)
Calcium: 7.9 mg/dL — ABNORMAL LOW (ref 8.9–10.3)
Chloride: 102 mmol/L (ref 98–111)
Creatinine, Ser: 10.49 mg/dL — ABNORMAL HIGH (ref 0.61–1.24)
GFR, Estimated: 6 mL/min — ABNORMAL LOW (ref >60)
Glucose, Bld: 101 mg/dL — ABNORMAL HIGH (ref 70–99)
Phosphorus: 6.1 mg/dL — ABNORMAL HIGH (ref 2.5–4.6)
Potassium: 5.2 mmol/L — ABNORMAL HIGH (ref 3.5–5.1)
Sodium: 138 mmol/L (ref 135–145)

## 2024-09-12 LAB — BASIC METABOLIC PANEL WITH GFR
Anion gap: 19 — ABNORMAL HIGH (ref 5–15)
BUN: 59 mg/dL — ABNORMAL HIGH (ref 6–20)
CO2: 18 mmol/L — ABNORMAL LOW (ref 22–32)
Calcium: 8.1 mg/dL — ABNORMAL LOW (ref 8.9–10.3)
Chloride: 101 mmol/L (ref 98–111)
Creatinine, Ser: 10.61 mg/dL — ABNORMAL HIGH (ref 0.61–1.24)
GFR, Estimated: 6 mL/min — ABNORMAL LOW (ref >60)
Glucose, Bld: 96 mg/dL (ref 70–99)
Potassium: 4.8 mmol/L (ref 3.5–5.1)
Sodium: 138 mmol/L (ref 135–145)

## 2024-09-12 LAB — POCT I-STAT, CHEM 8
BUN: 29 mg/dL — ABNORMAL HIGH (ref 6–20)
Calcium, Ion: 1.03 mmol/L — ABNORMAL LOW (ref 1.15–1.40)
Chloride: 96 mmol/L — ABNORMAL LOW (ref 98–111)
Creatinine, Ser: 6.5 mg/dL — ABNORMAL HIGH (ref 0.61–1.24)
Glucose, Bld: 83 mg/dL (ref 70–99)
HCT: 40 % (ref 39.0–52.0)
Hemoglobin: 13.6 g/dL (ref 13.0–17.0)
Potassium: 4.5 mmol/L (ref 3.5–5.1)
Sodium: 136 mmol/L (ref 135–145)
TCO2: 28 mmol/L (ref 22–32)

## 2024-09-12 LAB — HIV ANTIBODY (ROUTINE TESTING W REFLEX): HIV Screen 4th Generation wRfx: NONREACTIVE

## 2024-09-12 LAB — HEPATITIS B SURFACE ANTIGEN: Hepatitis B Surface Ag: NONREACTIVE

## 2024-09-12 LAB — LACTIC ACID, PLASMA
Lactic Acid, Venous: 2.7 mmol/L (ref 0.5–1.9)
Lactic Acid, Venous: 2 mmol/L (ref 0.5–1.9)

## 2024-09-12 LAB — SURGICAL PCR SCREEN
MRSA, PCR: NEGATIVE
Staphylococcus aureus: NEGATIVE

## 2024-09-12 SURGERY — REMOVAL OF ARTERIOVENOUS GORETEX GRAFT (AVGG)
Anesthesia: General | Site: Arm Upper | Laterality: Right

## 2024-09-12 MED ORDER — HEPARIN SODIUM (PORCINE) 5000 UNIT/ML IJ SOLN
5000.0000 [IU] | Freq: Three times a day (TID) | INTRAMUSCULAR | Status: DC
  Administered 2024-09-13 – 2024-09-23 (×33): 5000 [IU] via SUBCUTANEOUS
  Filled 2024-09-12 (×33): qty 1

## 2024-09-12 MED ORDER — HEPARIN 6000 UNIT IRRIGATION SOLUTION
Status: DC | PRN
  Administered 2024-09-12: 1

## 2024-09-12 MED ORDER — DEXMEDETOMIDINE HCL IN NACL 80 MCG/20ML IV SOLN
INTRAVENOUS | Status: DC | PRN
  Administered 2024-09-12: 4 ug via INTRAVENOUS
  Administered 2024-09-12: 8 ug via INTRAVENOUS
  Administered 2024-09-12 (×2): 4 ug via INTRAVENOUS

## 2024-09-12 MED ORDER — ALBUTEROL SULFATE (2.5 MG/3ML) 0.083% IN NEBU: 2.5000 mg | INHALATION_SOLUTION | RESPIRATORY_TRACT | Status: AC | PRN

## 2024-09-12 MED ORDER — HYDROMORPHONE HCL 1 MG/ML IJ SOLN
INTRAMUSCULAR | Status: DC | PRN
  Administered 2024-09-12: .5 mg via INTRAVENOUS

## 2024-09-12 MED ORDER — ACETAMINOPHEN 325 MG PO TABS
650.0000 mg | ORAL_TABLET | Freq: Four times a day (QID) | ORAL | Status: DC | PRN
  Filled 2024-09-12: qty 2

## 2024-09-12 MED ORDER — SODIUM CHLORIDE 0.9 % IV SOLN
1.0000 g | INTRAVENOUS | Status: DC
  Administered 2024-09-12 – 2024-09-15 (×4): 1 g via INTRAVENOUS
  Filled 2024-09-12 (×5): qty 10

## 2024-09-12 MED ORDER — TRAZODONE HCL 50 MG PO TABS
25.0000 mg | ORAL_TABLET | Freq: Every evening | ORAL | Status: DC | PRN
  Administered 2024-09-12 – 2024-09-24 (×13): 25 mg via ORAL
  Filled 2024-09-12 (×13): qty 1

## 2024-09-12 MED ORDER — LIDOCAINE 2% (20 MG/ML) 5 ML SYRINGE
INTRAMUSCULAR | Status: DC | PRN
  Administered 2024-09-12: 80 mg via INTRAVENOUS

## 2024-09-12 MED ORDER — PHENYLEPHRINE HCL-NACL 20-0.9 MG/250ML-% IV SOLN
INTRAVENOUS | Status: DC | PRN
  Administered 2024-09-12: 30 ug/min via INTRAVENOUS

## 2024-09-12 MED ORDER — SUCCINYLCHOLINE CHLORIDE 200 MG/10ML IV SOSY
PREFILLED_SYRINGE | INTRAVENOUS | Status: DC | PRN
  Administered 2024-09-12: 120 mg via INTRAVENOUS

## 2024-09-12 MED ORDER — MORPHINE SULFATE (PF) 2 MG/ML IV SOLN
INTRAVENOUS | Status: AC
  Filled 2024-09-12: qty 1

## 2024-09-12 MED ORDER — FENTANYL CITRATE (PF) 100 MCG/2ML IJ SOLN
INTRAMUSCULAR | Status: AC
  Filled 2024-09-12: qty 2

## 2024-09-12 MED ORDER — PROPOFOL 10 MG/ML IV BOLUS
INTRAVENOUS | Status: DC | PRN
  Administered 2024-09-12: 40 mg via INTRAVENOUS
  Administered 2024-09-12: 100 mg via INTRAVENOUS

## 2024-09-12 MED ORDER — DEXAMETHASONE SOD PHOSPHATE PF 10 MG/ML IJ SOLN
INTRAMUSCULAR | Status: DC | PRN
  Administered 2024-09-12: 10 mg via INTRAVENOUS

## 2024-09-12 MED ORDER — HEPARIN 6000 UNIT IRRIGATION SOLUTION
Status: AC
  Filled 2024-09-12: qty 500

## 2024-09-12 MED ORDER — PROTAMINE SULFATE 10 MG/ML IV SOLN
INTRAVENOUS | Status: DC | PRN
  Administered 2024-09-12: 20 mg via INTRAVENOUS

## 2024-09-12 MED ORDER — ACETAMINOPHEN 10 MG/ML IV SOLN
INTRAVENOUS | Status: DC | PRN
  Administered 2024-09-12: 1000 mg via INTRAVENOUS

## 2024-09-12 MED ORDER — LACTATED RINGERS IV SOLN: INTRAVENOUS | Status: DC

## 2024-09-12 MED ORDER — ONDANSETRON HCL 4 MG/2ML IJ SOLN
4.0000 mg | Freq: Four times a day (QID) | INTRAMUSCULAR | Status: DC | PRN
  Administered 2024-09-12 – 2024-09-22 (×7): 4 mg via INTRAVENOUS
  Filled 2024-09-12 (×6): qty 2

## 2024-09-12 MED ORDER — HEPARIN SODIUM (PORCINE) 5000 UNIT/ML IJ SOLN
5000.0000 [IU] | Freq: Three times a day (TID) | INTRAMUSCULAR | Status: DC
  Administered 2024-09-12: 5000 [IU] via SUBCUTANEOUS
  Filled 2024-09-12: qty 1

## 2024-09-12 MED ORDER — MORPHINE SULFATE (PF) 2 MG/ML IV SOLN
2.0000 mg | INTRAVENOUS | Status: DC | PRN
  Administered 2024-09-12 – 2024-09-24 (×13): 2 mg via INTRAVENOUS
  Filled 2024-09-12 (×13): qty 1

## 2024-09-12 MED ORDER — LINEZOLID 600 MG/300ML IV SOLN: 600.0000 mg | Freq: Two times a day (BID) | INTRAVENOUS | Status: DC

## 2024-09-12 MED ORDER — HYDRALAZINE HCL 20 MG/ML IJ SOLN: 10.0000 mg | Freq: Four times a day (QID) | INTRAMUSCULAR | Status: DC | PRN

## 2024-09-12 MED ORDER — ACETAMINOPHEN 650 MG RE SUPP: 650.0000 mg | Freq: Four times a day (QID) | RECTAL | Status: DC | PRN

## 2024-09-12 MED ORDER — SODIUM CHLORIDE 0.9 % IV SOLN: INTRAVENOUS | Status: DC | PRN

## 2024-09-12 MED ORDER — ONDANSETRON HCL 4 MG PO TABS
4.0000 mg | ORAL_TABLET | Freq: Four times a day (QID) | ORAL | Status: DC | PRN
  Administered 2024-09-13 – 2024-09-17 (×5): 4 mg via ORAL
  Filled 2024-09-12 (×5): qty 1

## 2024-09-12 MED ORDER — MONTELUKAST SODIUM 10 MG PO TABS
10.0000 mg | ORAL_TABLET | Freq: Every day | ORAL | Status: DC
  Administered 2024-09-12 – 2024-09-25 (×13): 10 mg via ORAL
  Filled 2024-09-12 (×13): qty 1

## 2024-09-12 MED ORDER — FENTANYL CITRATE (PF) 250 MCG/5ML IJ SOLN
INTRAMUSCULAR | Status: DC | PRN
  Administered 2024-09-12 (×2): 25 ug via INTRAVENOUS
  Administered 2024-09-12: 50 ug via INTRAVENOUS
  Administered 2024-09-12: 100 ug via INTRAVENOUS

## 2024-09-12 MED ORDER — LABETALOL HCL 5 MG/ML IV SOLN: 20.0000 mg | INTRAVENOUS | Status: DC | PRN

## 2024-09-12 MED ORDER — FENTANYL CITRATE (PF) 100 MCG/2ML IJ SOLN
25.0000 ug | INTRAMUSCULAR | Status: DC | PRN
  Administered 2024-09-12 (×2): 25 ug via INTRAVENOUS

## 2024-09-12 MED ORDER — PROPOFOL 10 MG/ML IV BOLUS
INTRAVENOUS | Status: AC
  Filled 2024-09-12: qty 20

## 2024-09-12 MED ORDER — SEVELAMER CARBONATE 800 MG PO TABS
800.0000 mg | ORAL_TABLET | Freq: Three times a day (TID) | ORAL | Status: DC
  Administered 2024-09-13: 800 mg via ORAL
  Filled 2024-09-12: qty 1

## 2024-09-12 MED ORDER — 0.9 % SODIUM CHLORIDE (POUR BTL) OPTIME
TOPICAL | Status: DC | PRN
  Administered 2024-09-12: 1000 mL

## 2024-09-12 MED ORDER — HEPARIN SODIUM (PORCINE) 1000 UNIT/ML IJ SOLN
INTRAMUSCULAR | Status: DC | PRN
  Administered 2024-09-12: 6000 [IU] via INTRAVENOUS

## 2024-09-12 MED ORDER — ALBUMIN HUMAN 5 % IV SOLN: INTRAVENOUS | Status: DC | PRN

## 2024-09-12 MED ORDER — AMLODIPINE BESYLATE 10 MG PO TABS
10.0000 mg | ORAL_TABLET | Freq: Every morning | ORAL | Status: DC
  Administered 2024-09-12 – 2024-09-25 (×12): 10 mg via ORAL
  Filled 2024-09-12 (×13): qty 1

## 2024-09-12 MED ORDER — GLYCOPYRROLATE 0.2 MG/ML IJ SOLN
INTRAMUSCULAR | Status: DC | PRN
  Administered 2024-09-12 (×2): .1 mg via INTRAVENOUS

## 2024-09-12 MED ORDER — SUGAMMADEX SODIUM 200 MG/2ML IV SOLN
INTRAVENOUS | Status: DC | PRN
  Administered 2024-09-12: 130 mg via INTRAVENOUS

## 2024-09-12 MED ORDER — HYDROMORPHONE HCL 1 MG/ML IJ SOLN
INTRAMUSCULAR | Status: AC
  Filled 2024-09-12: qty 0.5

## 2024-09-12 MED ORDER — HEPARIN SODIUM (PORCINE) 1000 UNIT/ML IJ SOLN
INTRAMUSCULAR | Status: AC
  Filled 2024-09-12: qty 4

## 2024-09-12 MED ORDER — ONDANSETRON HCL 4 MG/2ML IJ SOLN: 4.0000 mg | Freq: Once | INTRAMUSCULAR | Status: DC | PRN

## 2024-09-12 MED ORDER — ORAL CARE MOUTH RINSE: 15.0000 mL | Freq: Once | OROMUCOSAL | Status: AC

## 2024-09-12 MED ORDER — ROCURONIUM BROMIDE 10 MG/ML (PF) SYRINGE
PREFILLED_SYRINGE | INTRAVENOUS | Status: DC | PRN
  Administered 2024-09-12: 25 mg via INTRAVENOUS
  Administered 2024-09-12: 20 mg via INTRAVENOUS
  Administered 2024-09-12: 10 mg via INTRAVENOUS

## 2024-09-12 MED ORDER — AMISULPRIDE (ANTIEMETIC) 5 MG/2ML IV SOLN: 10.0000 mg | Freq: Once | INTRAVENOUS | Status: DC | PRN

## 2024-09-12 MED ORDER — MAGNESIUM HYDROXIDE 400 MG/5ML PO SUSP: 30.0000 mL | Freq: Every day | ORAL | Status: DC | PRN

## 2024-09-12 MED ORDER — PHENYLEPHRINE 80 MCG/ML (10ML) SYRINGE FOR IV PUSH (FOR BLOOD PRESSURE SUPPORT)
PREFILLED_SYRINGE | INTRAVENOUS | Status: DC | PRN
  Administered 2024-09-12: 120 ug via INTRAVENOUS
  Administered 2024-09-12: 80 ug via INTRAVENOUS
  Administered 2024-09-12: 160 ug via INTRAVENOUS
  Administered 2024-09-12: 80 ug via INTRAVENOUS

## 2024-09-12 MED ORDER — HYDROXYCHLOROQUINE SULFATE 200 MG PO TABS
200.0000 mg | ORAL_TABLET | ORAL | Status: DC
  Administered 2024-09-13 – 2024-09-25 (×12): 200 mg via ORAL
  Filled 2024-09-12 (×13): qty 1

## 2024-09-12 MED ORDER — LABETALOL HCL 100 MG PO TABS
100.0000 mg | ORAL_TABLET | Freq: Two times a day (BID) | ORAL | Status: AC
  Administered 2024-09-12 – 2024-09-25 (×25): 100 mg via ORAL
  Filled 2024-09-12 (×26): qty 1

## 2024-09-12 MED ORDER — ACETAMINOPHEN 10 MG/ML IV SOLN
INTRAVENOUS | Status: AC
  Filled 2024-09-12: qty 100

## 2024-09-12 MED ORDER — CHLORHEXIDINE GLUCONATE 0.12 % MT SOLN
OROMUCOSAL | Status: AC
  Administered 2024-09-12: 15 mL via OROMUCOSAL
  Filled 2024-09-12: qty 15

## 2024-09-12 MED ORDER — CEFAZOLIN SODIUM-DEXTROSE 2-3 GM-%(50ML) IV SOLR
INTRAVENOUS | Status: DC | PRN
  Administered 2024-09-12: 2 g via INTRAVENOUS

## 2024-09-12 MED ORDER — CHLORHEXIDINE GLUCONATE 0.12 % MT SOLN: 15.0000 mL | Freq: Once | OROMUCOSAL | Status: AC

## 2024-09-12 SURGICAL SUPPLY — 31 items
ARMBAND PINK RESTRICT EXTREMIT (MISCELLANEOUS) ×1 IMPLANT
BAG COUNTER SPONGE SURGICOUNT (BAG) ×1 IMPLANT
BNDG ELASTIC 4INX 5YD STR LF (GAUZE/BANDAGES/DRESSINGS) IMPLANT
BNDG ELASTIC 6INX 5YD STR LF (GAUZE/BANDAGES/DRESSINGS) IMPLANT
BNDG GAUZE DERMACEA FLUFF 4 (GAUZE/BANDAGES/DRESSINGS) IMPLANT
CANISTER SUCTION 3000ML PPV (SUCTIONS) ×1 IMPLANT
CLIP TI MEDIUM 6 (CLIP) ×1 IMPLANT
CLIP TI WIDE RED SMALL 6 (CLIP) ×1 IMPLANT
DERMABOND ADVANCED .7 DNX12 (GAUZE/BANDAGES/DRESSINGS) ×1 IMPLANT
ELECTRODE REM PT RTRN 9FT ADLT (ELECTROSURGICAL) ×1 IMPLANT
GLOVE BIO SURGEON STRL SZ7.5 (GLOVE) ×1 IMPLANT
GLOVE BIOGEL PI IND STRL 8 (GLOVE) ×1 IMPLANT
GOWN STRL REUS W/ TWL LRG LVL3 (GOWN DISPOSABLE) ×2 IMPLANT
GOWN STRL REUS W/ TWL XL LVL3 (GOWN DISPOSABLE) ×2 IMPLANT
KIT BASIN OR (CUSTOM PROCEDURE TRAY) ×1 IMPLANT
KIT TURNOVER KIT B (KITS) ×1 IMPLANT
NDL HYPO 25GX1X1/2 BEV (NEEDLE) ×1 IMPLANT
NEEDLE HYPO 25GX1X1/2 BEV (NEEDLE) ×1 IMPLANT
PACK CV ACCESS (CUSTOM PROCEDURE TRAY) ×1 IMPLANT
PAD ARMBOARD POSITIONER FOAM (MISCELLANEOUS) ×2 IMPLANT
SOLN 0.9% NACL POUR BTL 1000ML (IV SOLUTION) ×1 IMPLANT
SOLN STERILE WATER BTL 1000 ML (IV SOLUTION) ×1 IMPLANT
SPIKE FLUID TRANSFER (MISCELLANEOUS) IMPLANT
SUT MNCRL AB 4-0 PS2 18 (SUTURE) IMPLANT
SUT PROLENE 5 0 C 1 24 (SUTURE) IMPLANT
SUT PROLENE 6 0 BV (SUTURE) ×1 IMPLANT
SUT VIC AB 3-0 SH 27X BRD (SUTURE) ×1 IMPLANT
SWAB COLLECTION DEVICE MRSA (MISCELLANEOUS) IMPLANT
SWAB CULTURE ESWAB REG 1ML (MISCELLANEOUS) IMPLANT
TOWEL GREEN STERILE (TOWEL DISPOSABLE) ×1 IMPLANT
UNDERPAD 30X36 HEAVY ABSORB (UNDERPADS AND DIAPERS) ×1 IMPLANT

## 2024-09-12 NOTE — Consult Note (Signed)
 [facility] [place]  ASSESSMENT / PLAN: 43 y.o. male with right arm infected arteriovenous graft.  Plan excision in the operating room today.  Keep n.p.o. for now.  CHIEF COMPLAINT: Painful fluctuant collection in right arm  HISTORY OF PRESENT ILLNESS: Jorge [name] is a 43 y.o. male with end-stage renal disease on dialysis through a right jugular tunneled dialysis catheter.  The patient has received most of his care for dialysis access at [facility].  He is status post left arm brachiobasilic AV fistula complicated by steal syndrome which required DRIL.  Shortly thereafter his basilic vein fistula thrombosed.  He then had a right arm AV graft created.  This was also complicated by steal syndrome.  The graft was left in place and he was instructed not to access it to limit his steal syndromes.  Last night, the patient noticed the development of a fairly sizable fluid collection in the right distal upper arm near the arterial anastomosis of the arteriovenous graft.  This is painful to him.  This is warm.  He presented for evaluation.   Past Medical History:  Diagnosis Date   Asthma    daily inhaler/neb.   Bronchopulmonary dysplasia (HCC)    GERD (gastroesophageal reflux disease)    no current med.   Hypertension    states BP runs high, has been on meds. x 5-6 yrs.   Lupus    kidneys, lungs, heart 01-18-16 no recent problems-no steroid use recently   Renal disorder    Subcutaneous nodules 09/2017   bilat. thigh, left forearm, left axilla    Past Surgical History:  Procedure Laterality Date   1 HOUR PH STUDY N/A 03/20/2016   Procedure: 24 HOUR PH STUDY;  Surgeon: Belvie Just, MD;  Location: [facility];  Service: Endoscopy;  Laterality: N/A;   DIALYSIS/PERMA CATHETER INSERTION     ESOPHAGEAL MANOMETRY N/A 03/20/2016   Procedure: ESOPHAGEAL MANOMETRY (EM);  Surgeon: Belvie Just, MD;  Location: [facility];  Service: Endoscopy;  Laterality: N/A;    ESOPHAGOGASTRODUODENOSCOPY (EGD) WITH ESOPHAGEAL DILATION  08/19/2012   ESOPHAGOGASTRODUODENOSCOPY (EGD) WITH PROPOFOL  N/A 05/29/2014   Procedure: ESOPHAGOGASTRODUODENOSCOPY (EGD) WITH PROPOFOL ;  Surgeon: Belvie JONETTA Just, MD;  Location: [facility];  Service: Endoscopy;  Laterality: N/A;   ESOPHAGOGASTRODUODENOSCOPY (EGD) WITH PROPOFOL  N/A 07/24/2014   Procedure: ESOPHAGOGASTRODUODENOSCOPY (EGD) WITH PROPOFOL ;  Surgeon: Belvie JONETTA Just, MD;  Location: [facility];  Service: Endoscopy;  Laterality: N/A;   ESOPHAGOGASTRODUODENOSCOPY (EGD) WITH PROPOFOL  N/A 01/28/2016   Procedure: ESOPHAGOGASTRODUODENOSCOPY (EGD) WITH PROPOFOL ;  Surgeon: Belvie Just, MD;  Location: [facility];  Service: Endoscopy;  Laterality: N/A;   MASS EXCISION N/A 10/16/2017   Procedure: EXCISION MASS LEFT THIGH, RIGHT THIGH, LEFT FOREARM, LEFT AXILLA;  Surgeon: Vanderbilt Ned, MD;  Location: [facility];  Service: General;  Laterality: N/A;   PH IMPEDANCE STUDY N/A 03/20/2016   Procedure: PH IMPEDANCE STUDY;  Surgeon: Belvie Just, MD;  Location: [facility];  Service: Endoscopy;  Laterality: N/A;   TONSILLECTOMY     WISDOM TOOTH EXTRACTION      Family History  Problem Relation Age of Onset   Lupus Father    HIV Brother    Thyroid cancer Maternal Aunt     Social History   Socioeconomic History   Marital status: Single    Spouse name: Not on file   Number of children: 0   Years of education: 11   Highest education level: Not on file  Occupational History   Occupation:  Disabled  Tobacco Use   Smoking status: Never   Smokeless tobacco: Never  Vaping Use   Vaping status: Never Used  Substance and Sexual Activity   Alcohol use: No   Drug use: Not Currently    Types: Marijuana    Comment: last used 10/07/2017   Sexual activity: Never  Other Topics Concern   Not on file  Social History Narrative   Lives alone.  Independent.   Social Drivers of Corporate Investment Banker Strain: Not on  file  Food Insecurity: Medium Risk (09/30/2023)   Received from [facility]   Hunger Vital Sign    Within the past 12 months, you worried that your food would run out before you got money to buy more: Sometimes true    Within the past 12 months, the food you bought just didn't last and you didn't have money to get more. : Sometimes true  Transportation Needs: Unmet Transportation Needs (07/10/2024)   Received from [facility] - Transportation    In the past 12 months, has lack of transportation kept you from medical appointments or from getting medications?: Yes    Lack of Transportation (Non-Medical): No  Physical Activity: Not on file  Stress: Not on file  Social Connections: Unknown (02/05/2022)   Received [facility]   Social Network    Social Network: Not on file  Intimate Partner Violence: Unknown (01/13/2022)   Received from [facility]   HITS    Physically Hurt: Not on file    Insult or Talk Down To: Not on file    Threaten Physical Harm: Not on file    Scream or Curse: Not on file    Allergies  Allergen Reactions   Vancomycin Hives   Esomeprazole Rash    Current Facility-Administered Medications  Medication Dose Route Frequency Provider Last Rate Last Admin   acetaminophen  (TYLENOL ) tablet 650 mg  650 mg Oral Q6H PRN Mansy, Jan A, MD       Or   acetaminophen  (TYLENOL ) suppository 650 mg  650 mg Rectal Q6H PRN Mansy, Jan A, MD       albuterol  (PROVENTIL ) (2.5 MG/3ML) 0.083% nebulizer solution 2.5 mg  2.5 mg Inhalation Q4H PRN Mansy, Jan A, MD       alteplase  (CATHFLO ACTIVASE ) injection 2 mg  2 mg Intracatheter Once PRN Singh, Vikas, MD       amLODipine  (NORVASC ) tablet 10 mg  10 mg Oral q morning Mansy, Jan A, MD       anticoagulant sodium citrate  solution 5 mL  5 mL Intracatheter PRN Dennise Hoes, MD       ceFEPIme  (MAXIPIME ) 1 g in sodium chloride  0.9 % 100 mL IVPB  1 g Intravenous Q24H Mansy, Jan A, MD   Stopped at 09/12/24 9294    Chlorhexidine  Gluconate Cloth 2 % PADS 6 each  6 each Topical Q0600 Dennise Hoes, MD       heparin  injection 1,000 Units  1,000 Units Intracatheter PRN Dennise Hoes, MD       heparin  injection 5,000 Units  5,000 Units Subcutaneous Q8H Mansy, Jan A, MD   5,000 Units at 09/12/24 9395   hydrALAZINE  (APRESOLINE ) injection 10 mg  10 mg Intravenous Q6H PRN Mansy, Jan A, MD       hydroxychloroquine  (PLAQUENIL ) tablet 200 mg  200 mg Oral BH-q7a Mansy, Jan A, MD       labetalol  (NORMODYNE ) injection 20 mg  20 mg  Intravenous Q3H PRN Mansy, Jan A, MD       labetalol  (NORMODYNE ) tablet 100 mg  100 mg Oral BID Mansy, Jan A, MD   100 mg at 09/12/24 0430   lidocaine  (PF) (XYLOCAINE ) 1 % injection 5 mL  5 mL Intradermal PRN Dennise Hoes, MD       lidocaine -prilocaine  (EMLA ) cream 1 Application  1 Application Topical PRN Dennise Hoes, MD       linezolid  (ZYVOX ) IVPB 600 mg  600 mg Intravenous Q12H Emil Share, DO   Stopped at 09/12/24 0344   magnesium  hydroxide (MILK OF MAGNESIA) suspension 30 mL  30 mL Oral Daily PRN Mansy, Jan A, MD       montelukast  (SINGULAIR ) tablet 10 mg  10 mg Oral Daily Mansy, Jan A, MD       morphine  (PF) 2 MG/ML injection 2 mg  2 mg Intravenous Q4H PRN Mansy, Jan A, MD   2 mg at 09/12/24 9572   ondansetron  (ZOFRAN ) tablet 4 mg  4 mg Oral Q6H PRN Mansy, Jan A, MD       Or   ondansetron  (ZOFRAN ) injection 4 mg  4 mg Intravenous Q6H PRN Mansy, Jan A, MD   4 mg at 09/12/24 0427   pentafluoroprop-tetrafluoroeth (GEBAUERS) aerosol 1 Application  1 Application Topical PRN Dennise Hoes, MD       sevelamer  carbonate (RENVELA ) tablet 800 mg  800 mg Oral TID WC Mansy, Jan A, MD       traZODone  (DESYREL ) tablet 25 mg  25 mg Oral QHS PRN Mansy, Jan A, MD   25 mg at 09/12/24 9568   Current Outpatient Medications  Medication Sig Dispense Refill   albuterol  (PROVENTIL  HFA;VENTOLIN  HFA) 108 (90 Base) MCG/ACT inhaler Inhale 2 puffs into the lungs every 4 (four) hours as needed for wheezing or  shortness of breath. 1 Inhaler 1   amLODipine  (NORVASC ) 10 MG tablet Take 10 mg by mouth every morning.     hydroxychloroquine  (PLAQUENIL ) 200 MG tablet Take 200 mg by mouth every morning.     labetalol  (NORMODYNE ) 100 MG tablet Take 100 mg by mouth 2 (two) times daily.     montelukast  (SINGULAIR ) 10 MG tablet Take 10 mg by mouth daily.     sevelamer  carbonate (RENVELA ) 800 MG tablet TAKE TWO TABLETS BY MOUTH WITH MEALS AND 1 WITH SNACKS. SWALLOW TABLET WHOLE. DO NOT CRUSH, BREAK, OR CHEW      PHYSICAL EXAM Vitals:   09/12/24 0400 09/12/24 0427 09/12/24 0600 09/12/24 0611  BP: 135/86 135/86 (!) 146/85   Pulse: (!) 108 (!) 115 93   Resp: 17  14   Temp:    98.6 F (37 C)  TempSrc:    Oral  SpO2: 98%  95%   Weight:      Height:       No acute distress Regular rate and rhythm Unlabored breathing Right arm AVG with fluctuant collection about the arterial anastomosis.  PERTINENT LABORATORY AND RADIOLOGIC DATA  Most recent CBC    Latest Ref Rng & Units 09/12/2024    4:55 AM 09/12/2024   12:27 AM 09/11/2024    7:39 PM  CBC  WBC 4.0 - 10.5 K/uL 5.4  7.0  6.3   Hemoglobin 13.0 - 17.0 g/dL 89.0  87.8  86.5   Hematocrit 39.0 - 52.0 % 36.1  40.3  43.4   Platelets 150 - 400 K/uL 176  149  197      Most recent CMP  Latest Ref Rng & Units 09/12/2024    4:55 AM 09/12/2024   12:28 AM 09/11/2024    7:39 PM  CMP  Glucose 70 - 99 mg/dL 96  898  82   BUN 6 - 20 mg/dL 59  57  52   Creatinine 0.61 - 1.24 mg/dL 89.38  89.50  89.43   Sodium 135 - 145 mmol/L 138  138  139   Potassium 3.5 - 5.1 mmol/L 4.8  5.2  6.7   Chloride 98 - 111 mmol/L 101  102  103   CO2 22 - 32 mmol/L 18  21  22    Calcium 8.9 - 10.3 mg/dL 8.1  7.9  8.7   Total Protein 6.5 - 8.1 g/dL   9.3   Total Bilirubin 0.0 - 1.2 mg/dL   0.4   Alkaline Phos 38 - 126 U/L   72   AST 15 - 41 U/L   16   ALT 0 - 44 U/L   15    CT scan of humerus shows abscess about arterial anastomosis of right arm AVG  Debby SAILOR. Magda, MD  FACS [facility] Phone Number: [phone] 09/12/2024 7:25 AM   Total time spent on preparing this encounter including chart review, data review, collecting history, examining the patient, and coordinating care: 60 min  Portions of this report may have been transcribed using voice recognition software.  Every effort has been made to ensure accuracy; however, inadvertent computerized transcription errors may still be present.

## 2024-09-12 NOTE — H&P (Incomplete)
 [place]   PATIENT NAME: Jorge [name]    MR#:  [id number]  DATE OF BIRTH:  13-Jul-1981  DATE OF ADMISSION:  09/11/2024  PRIMARY CARE PHYSICIAN: Austin Mutton, MD   Patient is coming from: Home  REQUESTING/REFERRING PHYSICIAN: Emil Share, DO  Emil Share, DO CHIEF COMPLAINT:   Chief Complaint  Patient presents with   Arm Problem    AV Fistula swelling    HISTORY OF PRESENT ILLNESS:  Jermar Colter is a 43 y.o. African-American male with medical history significant for asthma, GERD, essential hypertension, and SLE, who presented to the emergency room with acute onset of right arm swelling and pain at the site of his AV fistula with associated warmth.  He denied any fever or chills.  No nausea or vomiting or abdominal pain.  He has been getting his hemodialysis through his right chest port.  His AV fistula has been used apparently due to steal syndrome.  He denied any cough or wheezing or hemoptysis.  No chest pain or palpitations.  No bleeding diathesis.  No dysuria, oliguria or hematuria or flank pain.  ED Course: When the patient came to the ER, BP was 182/125 with otherwise normal vital signs and later BP was 149/108.  Labs revealed hyperkalemia of 6.7 and a BUN of 52 with creatinine 10.57 with calcium 8.7 and total protein 9.3 with albumin  4.  CBC was normal.  Lactic acid was 1.4.  Blood glucose was 82 and later 101. EKG as reviewed by me : EKG showed sinus rhythm with rate of 90 with poor R wave progression. Imaging: CT of the right humerus with contrast revealed the following: 1. 2.8 cm enhancing fluid collection adjacent to the distal 2 cm of the graft in the right upper extremity. This is concerning for abscess. 2. Mild subcutaneous stranding surrounding the entire graft. 3. 5 mm right upper lobe pulmonary nodule.  The patient was given 4 mg of IV Zofran  and 0.5 mg of IV Dilaudid  and 2 g of IV Rocephin , 10 g of p.o. Lokelma  D50/insulin  and nebulized albuterol  for  hyperkalemia.  Nephrology was contacted and the patient will be dialyzed in AM.  He will be admitted to progressive unit observation bed for further evaluation and management. PAST MEDICAL HISTORY:   Past Medical History:  Diagnosis Date   Asthma    daily inhaler/neb.   Bronchopulmonary dysplasia (HCC)    GERD (gastroesophageal reflux disease)    no current med.   Hypertension    states BP runs high, has been on meds. x 5-6 yrs.   Lupus    kidneys, lungs, heart 01-18-16 no recent problems-no steroid use recently   Renal disorder    Subcutaneous nodules 09/2017   bilat. thigh, left forearm, left axilla    PAST SURGICAL HISTORY:   Past Surgical History:  Procedure Laterality Date   74 HOUR PH STUDY N/A 03/20/2016   Procedure: 24 HOUR PH STUDY;  Surgeon: Belvie Just, MD;  Location: [facility];  Service: Endoscopy;  Laterality: N/A;   DIALYSIS/PERMA CATHETER INSERTION     ESOPHAGEAL MANOMETRY N/A 03/20/2016   Procedure: ESOPHAGEAL MANOMETRY (EM);  Surgeon: Belvie Just, MD;  Location: [facility];  Service: Endoscopy;  Laterality: N/A;   ESOPHAGOGASTRODUODENOSCOPY (EGD) WITH ESOPHAGEAL DILATION  08/19/2012   ESOPHAGOGASTRODUODENOSCOPY (EGD) WITH PROPOFOL  N/A 05/29/2014   Procedure: ESOPHAGOGASTRODUODENOSCOPY (EGD) WITH PROPOFOL ;  Surgeon: Belvie JONETTA Just, MD;  Location: [facility];  Service: Endoscopy;  Laterality: N/A;   ESOPHAGOGASTRODUODENOSCOPY (EGD)  WITH PROPOFOL  N/A 07/24/2014   Procedure: ESOPHAGOGASTRODUODENOSCOPY (EGD) WITH PROPOFOL ;  Surgeon: Belvie JONETTA Just, MD;  Location: [facility];  Service: Endoscopy;  Laterality: N/A;   ESOPHAGOGASTRODUODENOSCOPY (EGD) WITH PROPOFOL  N/A 01/28/2016   Procedure: ESOPHAGOGASTRODUODENOSCOPY (EGD) WITH PROPOFOL ;  Surgeon: Belvie Just, MD;  Location: [facility];  Service: Endoscopy;  Laterality: N/A;   MASS EXCISION N/A 10/16/2017   Procedure: EXCISION MASS LEFT THIGH, RIGHT THIGH, LEFT FOREARM, LEFT AXILLA;  Surgeon: Vanderbilt Ned, MD;  Location: [facility];  Service: General;  Laterality: N/A;   PH IMPEDANCE STUDY N/A 03/20/2016   Procedure: PH IMPEDANCE STUDY;  Surgeon: Belvie Just, MD;  Location: [facility];  Service: Endoscopy;  Laterality: N/A;   TONSILLECTOMY     WISDOM TOOTH EXTRACTION      SOCIAL HISTORY:   Social History   Tobacco Use   Smoking status: Never   Smokeless tobacco: Never  Substance Use Topics   Alcohol use: No    FAMILY HISTORY:   Family History  Problem Relation Age of Onset   Lupus Father    HIV Brother    Thyroid cancer Maternal Aunt     DRUG ALLERGIES:   Allergies  Allergen Reactions   Vancomycin Hives   Esomeprazole Rash    REVIEW OF SYSTEMS:   ROS As per history of present illness. All pertinent systems were reviewed above. Constitutional, HEENT, cardiovascular, respiratory, GI, GU, musculoskeletal, neuro, psychiatric, endocrine, integumentary and hematologic systems were reviewed and are otherwise negative/unremarkable except for positive findings mentioned above in the HPI.   MEDICATIONS AT HOME:   Prior to Admission medications   Medication Sig Start Date End Date Taking? Authorizing Provider  albuterol  (PROVENTIL  HFA;VENTOLIN  HFA) 108 (90 Base) MCG/ACT inhaler Inhale 2 puffs into the lungs every 4 (four) hours as needed for wheezing or shortness of breath. 03/12/16   Bernard Drivers, MD  amLODipine  (NORVASC ) 10 MG tablet Take 10 mg by mouth every morning.    [provider]  arformoterol  (BROVANA ) 15 MCG/2ML NEBU Take 2 mLs (15 mcg total) by nebulization 2 (two) times daily. DX code J47.9 01/06/16   Mannam, Praveen, MD  AURYXIA 1 GM 210 MG(Fe) tablet Take 420 mg by mouth 3 (three) times daily. 06/28/22   [provider]  famotidine  (PEPCID ) 20 MG tablet Take 20 mg by mouth 2 (two) times daily. 07/10/22   [provider]  hydroxychloroquine  (PLAQUENIL ) 200 MG tablet Take 400 mg by mouth every morning.    [provider]  ibuprofen  (ADVIL ,MOTRIN ) 800 MG tablet Take 1 tablet (800 mg total) by mouth every 8 (eight) hours as needed. 10/16/17   Cornett, Ned, MD  labetalol  (NORMODYNE ) 100 MG tablet Take 100 mg by mouth 2 (two) times daily. 07/10/22   [provider]  montelukast  (SINGULAIR ) 10 MG tablet Take 10 mg by mouth daily. 07/10/22   [provider]      VITAL SIGNS:  Blood pressure (!) 127/99, pulse 90, temperature 98.6 F (37 C), resp. rate 17, height 5' 8 (1.727 m), weight 65.8 kg, SpO2 98%.  PHYSICAL EXAMINATION:  Physical Exam  GENERAL:  43 y.o.-year-old African-American male patient lying in the bed with no acute distress.  EYES: Pupils equal, round, reactive to light and accommodation. No scleral icterus. Extraocular muscles intact.  HEENT: Head atraumatic, normocephalic. Oropharynx and nasopharynx clear.  NECK:  Supple, no jugular venous distention. No thyroid enlargement, no tenderness.  LUNGS: Normal breath sounds bilaterally, no wheezing, rales,rhonchi or crepitation.  No use of accessory muscles of respiration.  CARDIOVASCULAR: Regular rate and rhythm, S1, S2 normal. No murmurs, rubs, or gallops.   ABDOMEN: Soft, nondistended, nontender. Bowel sounds present. No organomegaly or mass.  EXTREMITIES: No pedal edema, cyanosis, or clubbing.  NEUROLOGIC: Cranial nerves II through XII are intact. Muscle strength 5/5 in all extremities. Sensation intact. Gait not checked.  PSYCHIATRIC: The patient is alert and oriented x 3.  Normal affect and good eye contact. SKIN: Right arm swelling with warmth and tenderness at the site of his AV fistula. No other obvious rash, lesion, or ulcer.    LABORATORY PANEL:   CBC Recent Labs  Lab 09/12/24 0027  WBC 7.0  HGB 12.1*  HCT 40.3  PLT 149*   ------------------------------------------------------------------------------------------------------------------  Chemistries  Recent Labs  Lab 09/11/24 1939 09/12/24 0028   NA 139 138  K 6.7* 5.2*  CL 103 102  CO2 22 21*  GLUCOSE 82 101*  BUN 52* 57*  CREATININE 10.56* 10.49*  CALCIUM 8.7* 7.9*  AST 16  --   ALT 15  --   ALKPHOS 72  --   BILITOT 0.4  --    ------------------------------------------------------------------------------------------------------------------  Cardiac Enzymes No results for input(s): TROPONINI in the last 168 hours. ------------------------------------------------------------------------------------------------------------------  RADIOLOGY:  CT HUMERUS RIGHT W CONTRAST Result Date: 09/11/2024 CLINICAL DATA:  Soft tissue mass of upper arm. Recent graft placement. EXAM: CT OF THE UPPER RIGHT EXTREMITY WITH CONTRAST TECHNIQUE: Multidetector CT imaging of the upper right extremity was performed according to the standard protocol following intravenous contrast administration. RADIATION DOSE REDUCTION: This exam was performed according to the departmental dose-optimization program which includes automated exposure control, adjustment of the mA and/or kV according to patient size and/or use of iterative reconstruction technique. CONTRAST:  75mL OMNIPAQUE  IOHEXOL  350 MG/ML SOLN COMPARISON:  None Available. FINDINGS: There is no acute fracture or focal osseous lesion. There is no significant elbow or shoulder joint effusion. A graft is present connecting the left axillary vein to a vessel at the level of the distal humerus. Graft is grossly patent, but evaluation is limited secondary to timing of the contrast bolus. There is mild subcutaneous stranding surrounding the entire graft. Adjacent to the distal 2 cm of the graft there is an enhancing fluid collection measuring 2.7 x 2.2 by 2.8 cm. This abuts the graft. There is no contrast opacification within this collection. There is surrounding inflammatory stranding. Multiple superficial collateral vessels are seen along the right anterior chest wall and within the proximal right arm. There is a  right upper lobe nodule measuring 5 mm on image 4/50. IMPRESSION: 1. 2.8 cm enhancing fluid collection adjacent to the distal 2 cm of the graft in the right upper extremity. This is concerning for abscess. 2. Mild subcutaneous stranding surrounding the entire graft. 3. 5 mm right upper lobe pulmonary nodule. No follow-up needed if patient is low-risk.This recommendation follows the consensus statement: Guidelines for Management of Incidental Pulmonary Nodules Detected on CT Images: From the Fleischner Society 2017; Radiology 2017; 284:228-243. Electronically Signed   By: Greig Pique M.D.   On: 09/11/2024 23:11      IMPRESSION AND PLAN:  Assessment and Plan: * Hyperkalemia - This is associated with end-stage renal disease on hemodialysis. - The patient will be admitted to a progressive unit observation bed - The patient was aggressively managed for it. - Will follow potassium level. - Nephrology consult will be obtained.  I notified Dr. Dennise about the patient. - Plan is for  hemodialysis in AM.  Right arm cellulitis - This is at the site of his AV fistula. - The patient will be placed on IV antibiotic therapy with Zyvox  and cefepime .  He is allergic to vancomycin. - Warm compresses will be utilized. - Pain management will be provided.  Hypertensive urgency - The patient will be placed on his antihypertensives. - Will add as needed IV labetalol  and hydralazine .  ESRD on hemodialysis [facility]) Nephrology consult will be obtained as above for hemodialysis this a.m. specially given his hyperkalemia.   DVT prophylaxis: SQ heparin . Advanced Care Planning:  Code Status: full code.  Family communication:  The plan of care was discussed in details with the patient (and family). I answered all questions. The patient agreed to proceed with the above mentioned plan. Further management will depend upon hospital course. Disposition Plan: Back to previous home environment Consults called:  Nephrology All the records are reviewed and case discussed with ED provider.  Status is: Observation  I certify that at the time of admission, it is my clinical judgment that the patient will require hospital care extending less than 2 midnights.                            Dispo: The patient is from: Home              Anticipated d/c is to: Home              Patient currently is not medically stable to d/c.              Difficult to place patient: No  Madison DELENA Peaches M.D on 09/12/2024 at 3:55 AM  [facility]   From 7 PM-7 AM, contact night-coverage [email or web address]  CC: Primary care physician; Austin Mutton, MD

## 2024-09-12 NOTE — Progress Notes (Signed)
 [facility]  PROGRESS NOTE  Jorge [name] FMW:[id number] DOB: 1980/11/19 DOA: 09/11/2024 PCP: Austin Mutton, MD   Brief HPI:    43 y.o. African-American male with medical history significant for asthma, GERD, essential hypertension, and SLE, who presented to the emergency room with acute onset of right arm swelling and pain at the site of his AV fistula with associated warmth.  He denied any fever or chills.  No nausea or vomiting or abdominal pain.  He has been getting his hemodialysis through his right chest port.  His AV fistula has been used apparently due to steal syndrome.  He denied any cough or wheezing or hemoptysis.  No chest pain or palpitations.  No bleeding diathesis.  No dysuria, oliguria or hematuria or flank pain.    Imaging: CT of the right humerus with contrast revealed the following: 1. 2.8 cm enhancing fluid collection adjacent to the distal 2 cm of the graft in the right upper extremity. This is concerning for abscess. 2. Mild subcutaneous stranding surrounding the entire graft. 3. 5 mm right upper lobe pulmonary nodule.      Assessment/Plan:    Hyperkalemia - Resolved - Presented with potassium of 6.7, in setting of ESRD -Underwent hemodialysis last night   Right arm infected AV graft - Vascular surgery has been consulted -Plan for drainage of the abscess today   Hypertensive urgency - Resolved -Continue amlodipine  -Continue labetalol    ESRD on hemodialysis [facility]) Nephrology consulted        DVT prophylaxis: heparin   Medications     amLODipine   10 mg Oral q morning   Chlorhexidine  Gluconate Cloth  6 each Topical Q0600   heparin   5,000 Units Subcutaneous Q8H   hydroxychloroquine   200 mg Oral BH-q7a   labetalol   100 mg Oral BID   montelukast   10 mg Oral Daily   sevelamer  carbonate  800 mg Oral TID WC     Data Reviewed:   CBG:  No results for input(s): GLUCAP in the last 168 hours.  SpO2: 95 %    Vitals:   09/12/24 0400 09/12/24  0427 09/12/24 0600 09/12/24 0611  BP: 135/86 135/86 (!) 146/85   Pulse: (!) 108 (!) 115 93   Resp: 17  14   Temp:    98.6 F (37 C)  TempSrc:    Oral  SpO2: 98%  95%   Weight:      Height:          Data Reviewed:  Basic Metabolic Panel: Recent Labs  Lab 09/11/24 1939 09/12/24 0028 09/12/24 0455  NA 139 138 138  K 6.7* 5.2* 4.8  CL 103 102 101  CO2 22 21* 18*  GLUCOSE 82 101* 96  BUN 52* 57* 59*  CREATININE 10.56* 10.49* 10.61*  CALCIUM 8.7* 7.9* 8.1*  PHOS  --  6.1*  --     CBC: Recent Labs  Lab 09/11/24 1939 09/12/24 0027 09/12/24 0455  WBC 6.3 7.0 5.4  NEUTROABS 4.0  --   --   HGB 13.4 12.1* 10.9*  HCT 43.4 40.3 36.1*  MCV 88.2 90.4 90.3  PLT 197 149* 176    LFT Recent Labs  Lab 09/11/24 1939 09/12/24 0028  AST 16  --   ALT 15  --   ALKPHOS 72  --   BILITOT 0.4  --   PROT 9.3*  --   ALBUMIN  4.0 3.1*     Antibiotics: Anti-infectives (From admission, onward)    Start     Dose/Rate  Route Frequency Ordered Stop   09/12/24 1000  linezolid  (ZYVOX ) IVPB 600 mg  Status:  Discontinued        600 mg 300 mL/hr over 60 Minutes Intravenous Every 12 hours 09/12/24 0355 09/12/24 0359   09/12/24 0700  hydroxychloroquine  (PLAQUENIL ) tablet 200 mg        200 mg Oral BH-each morning 09/12/24 0336     09/12/24 0500  ceFEPIme  (MAXIPIME ) 1 g in sodium chloride  0.9 % 100 mL IVPB        1 g 200 mL/hr over 30 Minutes Intravenous Every 24 hours 09/12/24 0355     09/11/24 2315  cefTRIAXone  (ROCEPHIN ) 2 g in sodium chloride  0.9 % 100 mL IVPB        2 g 200 mL/hr over 30 Minutes Intravenous  Once 09/11/24 2304 09/12/24 0238   09/11/24 2315  linezolid  (ZYVOX ) IVPB 600 mg        600 mg 300 mL/hr over 60 Minutes Intravenous Every 12 hours 09/11/24 2307          CONSULTS vascular surgery  Code Status: Full code  Family Communication:      Subjective   Denies any complaints   Objective    Physical Examination:   General-appears in no acute  distress Heart-S1-S2, regular, no murmur auscultated Lungs-clear to auscultation bilaterally, no wheezing or crackles auscultated Abdomen-soft, nontender, no organomegaly Extremities-no edema in the lower extremities Neuro-alert, oriented x3, no focal deficit noted             Maudy Yonan S Wendle Kina   [facility] If 7PM-7AM, please contact night-coverage at [email or web address], Office  [phone]   09/12/2024, 7:35 AM  LOS: 0 days

## 2024-09-12 NOTE — Progress Notes (Signed)
   09/12/24 1145  Vitals  Temp 98.4 F (36.9 C)  Pulse Rate 96  Resp 15  BP 128/73  SpO2 98 %  O2 Device Room Air  Weight 65 kg (unable to obtain, nusre aware)  Type of Weight Post-Dialysis  Post Treatment  Dialyzer Clearance Clear  Hemodialysis Intake (mL) 0 mL  Liters Processed 71.9  Fluid Removed (mL) 800 mL  Tolerated HD Treatment Yes   Received patient in bed to unit.  Alert and oriented.  Informed consent signed and in chart.   TX duration:3.0  Patient tolerated well.  Transported back to the room  Alert, without acute distress.  Hand-off given to patient's nurse.   Access used: RIJDLC Access issues: lines reversed midrun  Total UF removed: 800cc Medication(s) given: morphine  2mg  iv x 1   Jorge [name] [facility]

## 2024-09-12 NOTE — TOC CM/SW Note (Signed)
 Transition of Care [facility]) - Inpatient Brief Assessment   Patient Details  Name: Jorge [name] MRN: [id number] Date of Birth: [birth year]  Transition of [facility]) CM/SW Contact:    Waddell Barnie Rama, RN Phone Number: 09/12/2024, 12:57 PM   Clinical Narrative: From home with mother and grandmother, has PCP and insurance on file, states has no HH services in place at this time or DME at home.  States family member  (mom) will transport them home at [employer] and family is support system, states gets medications from [employer]  Pta self ambulatory.   There are no ICM needs identified  at this time.  Please place consult for ICM needs.     Transition of Care Asessment: Insurance and Status: Insurance coverage has been reviewed Patient has primary care physician: Yes Home environment has been reviewed: home with mother and grand mother Prior level of function:: indep Prior/Current Home Services: No current home services Social Drivers of Health Review: SDOH reviewed no interventions necessary Readmission risk has been reviewed: Yes Transition of care needs: no transition of care needs at this time

## 2024-09-12 NOTE — Anesthesia Preprocedure Evaluation (Signed)
 Anesthesia Evaluation  Patient identified by MRN, date of birth, ID band Patient awake    Reviewed: Allergy & Precautions, NPO status , Patient's Chart, lab work & pertinent test results, reviewed documented beta blocker date and time   Airway Mallampati: II  TM Distance: >3 FB Neck ROM: Full    Dental  (+) Teeth Intact, Dental Advisory Given   Pulmonary asthma (daily inhaler/neb)    Pulmonary exam normal breath sounds clear to auscultation       Cardiovascular hypertension, Pt. on medications and Pt. on home beta blockers Normal cardiovascular exam Rhythm:Regular Rate:Normal     Neuro/Psych negative neurological ROS     GI/Hepatic Neg liver ROS,GERD  ,,  Endo/Other  negative endocrine ROS    Renal/GU Dialysis and ESRFRenal diseaseinfected AV graft     Musculoskeletal negative musculoskeletal ROS (+)    Abdominal   Peds  Hematology  (+) Blood dyscrasia, anemia   Anesthesia Other Findings Day of surgery medications reviewed with the patient.  Reproductive/Obstetrics                              Anesthesia Physical Anesthesia Plan  ASA: 3  Anesthesia Plan: General   Post-op Pain Management:    Induction: Intravenous  PONV Risk Score and Plan:   Airway Management Planned: Oral ETT  Additional Equipment:   Intra-op Plan:   Post-operative Plan: Extubation in OR  Informed Consent: I have reviewed the patients History and Physical, chart, labs and discussed the procedure including the risks, benefits and alternatives for the proposed anesthesia with the patient or authorized representative who has indicated his/her understanding and acceptance.     Dental advisory given  Plan Discussed with: CRNA  Anesthesia Plan Comments:         Anesthesia Quick Evaluation

## 2024-09-12 NOTE — ED Notes (Signed)
 IV team at bedside

## 2024-09-12 NOTE — Progress Notes (Signed)
 PHARMACY - PHYSICIAN COMMUNICATION CRITICAL VALUE ALERT - BLOOD CULTURE IDENTIFICATION (BCID)  Jorge [name] is an 43 y.o. male who presented to [facility] on 09/11/2024 with a chief complaint of infected AV graft   Assessment: Bcx 1/2 with Staph epi Mec A resistance    Name of physician (or Provider) Contacted: Dr. Drusilla.  Current antibiotics: linezolid  and cefepime    Changes to prescribed antibiotics recommended: stop cefepime  >  Recommendations declined by provider - infection may be polymicrobial  Results for orders placed or performed during the hospital encounter of 09/11/24  Blood Culture ID Panel (Reflexed) (Collected: 09/11/2024  7:39 PM)  Result Value Ref Range   Enterococcus faecalis NOT DETECTED NOT DETECTED   Enterococcus Faecium NOT DETECTED NOT DETECTED   Listeria monocytogenes NOT DETECTED NOT DETECTED   Staphylococcus species DETECTED (A) NOT DETECTED   Staphylococcus aureus (BCID) NOT DETECTED NOT DETECTED   Staphylococcus epidermidis DETECTED (A) NOT DETECTED   Staphylococcus lugdunensis NOT DETECTED NOT DETECTED   Streptococcus species NOT DETECTED NOT DETECTED   Streptococcus agalactiae NOT DETECTED NOT DETECTED   Streptococcus pneumoniae NOT DETECTED NOT DETECTED   Streptococcus pyogenes NOT DETECTED NOT DETECTED   A.calcoaceticus-baumannii NOT DETECTED NOT DETECTED   Bacteroides fragilis NOT DETECTED NOT DETECTED   Enterobacterales NOT DETECTED NOT DETECTED   Enterobacter cloacae complex NOT DETECTED NOT DETECTED   Escherichia coli NOT DETECTED NOT DETECTED   Klebsiella aerogenes NOT DETECTED NOT DETECTED   Klebsiella oxytoca NOT DETECTED NOT DETECTED   Klebsiella pneumoniae NOT DETECTED NOT DETECTED   Proteus species NOT DETECTED NOT DETECTED   Salmonella species NOT DETECTED NOT DETECTED   Serratia marcescens NOT DETECTED NOT DETECTED   Haemophilus influenzae NOT DETECTED NOT DETECTED   Neisseria meningitidis NOT DETECTED NOT DETECTED   Pseudomonas  aeruginosa NOT DETECTED NOT DETECTED   Stenotrophomonas maltophilia NOT DETECTED NOT DETECTED   Candida albicans NOT DETECTED NOT DETECTED   Candida auris NOT DETECTED NOT DETECTED   Candida glabrata NOT DETECTED NOT DETECTED   Candida krusei NOT DETECTED NOT DETECTED   Candida parapsilosis NOT DETECTED NOT DETECTED   Candida tropicalis NOT DETECTED NOT DETECTED   Cryptococcus neoformans/gattii NOT DETECTED NOT DETECTED   Methicillin resistance mecA/C DETECTED (A) NOT DETECTED    Jinnie Door, PharmD, BCPS, BCCP Clinical Pharmacist  Please check AMION for all [facility] phone numbers After 10:00 PM, call Main Pharmacy [phone]

## 2024-09-12 NOTE — Anesthesia Procedure Notes (Signed)
 Procedure Name: Intubation Date/Time: 09/12/2024 3:14 PM  Performed by: Jerl Donald LABOR, CRNAPre-anesthesia Checklist: Patient identified, Emergency Drugs available, Suction available and Patient being monitored Patient Re-evaluated:Patient Re-evaluated prior to induction Oxygen Delivery Method: Circle System Utilized Preoxygenation: Pre-oxygenation with 100% oxygen Induction Type: IV induction Ventilation: Mask ventilation without difficulty Laryngoscope Size: Mac and 4 Grade View: Grade IV Tube type: Oral Tube size: 7.5 mm Number of attempts: 1 Airway Equipment and Method: Stylet Placement Confirmation: positive ETCO2 and breath sounds checked- equal and bilateral Secured at: 23 cm Tube secured with: Tape Dental Injury: Teeth and Oropharynx as per pre-operative assessment

## 2024-09-12 NOTE — Progress Notes (Signed)
 Pt receives out-pt HD at [facility], MWF, 1150 chair time. Will continue to assist as needed.   Lavanda Jerad Dunlap Dialysis Navigator [phone]

## 2024-09-12 NOTE — Assessment & Plan Note (Signed)
-   The patient will be placed on his antihypertensives. - Will add as needed IV labetalol  and hydralazine .

## 2024-09-12 NOTE — Assessment & Plan Note (Signed)
 Nephrology consult will be obtained as above for hemodialysis this a.m. specially given his hyperkalemia.

## 2024-09-12 NOTE — Assessment & Plan Note (Signed)
-   This is associated with end-stage renal disease on hemodialysis. - The patient will be admitted to a progressive unit observation bed - The patient was aggressively managed for it. - Will follow potassium level. - Nephrology consult will be obtained.  I notified Dr. Dennise about the patient. - Plan is for hemodialysis in AM.

## 2024-09-12 NOTE — Op Note (Signed)
 Date: September 12, 2024  Preoperative diagnosis: Concern for infected right upper arm AV graft with abscess  Postoperative diagnosis: Seroma around right upper arm AV graft  Procedure: 1.  Excision of right upper arm AV graft with drainage of seroma 2.  Vein patch angioplasty right brachial artery with brachial vein  Surgeon: Dr. Lonni DOROTHA Gaskins, MD  Assistant: Ahmed Holster, PA  Indications: 43 year old male presented to the ED with pain over his right arm AV graft that has previously been placed at [facility].  CT imaging showed concern for abscess around the arterial anastomosis.  He presents for AV graft excision after risk benefits discussed.  An assistant was needed given the complexity of the case and also for excision of the graft and vein patch on the artery.  Findings: I opened the previous incision adjacent to the arterial anastomosis and there was what appeared to be a seroma with clear fluid.  This was sent for culture.  The graft was exposed in the wound and there was ongoing underlying concern for infection so I elected to excise the graft.  Ultimately I went to the axilla and dissected out the graft where it was anastomosed to the brachial/axillary vein and this was ligated and the brachial/axillary vein was oversewn.  I then had to make several skip incisions in the right upper arm over the graft to completely excise the graft as this is pretty well incorporated.  Ultimately the arterial anastomosis was taken down with a tourniquet in place and I performed a vein patch with brachial vein.  Palpable radial pulse at completion.  Anesthesia: General  Details: Patient was taken to the operating room after informed consent was obtained.  Placed on the operative table in supine position.  After anesthesia was induced the right arm was prepped and draped in standard sterile fashion.  Antibiotics were up-to-date and timeout performed.  Initially opened the previous incision  just above the antecubitum on the medial right arm and dissected down and we encountered a fluid collection that look like seroma.  This was all drained.  I sent cultures.  The graft was exposed in the wound and ultimately elected to go ahead and excise this given the ongoing concern for infection and he is no longer using the graft.  We then made an incision up to the axilla dissected down and followed the graft onto the brachial vein anastomosis onto the axillary vein.  This was quite scarred and and the dissection was very tedious.  Finally got enough room that I was able to use Henley clamps to transect the graft off the axillary vein and this was oversewn with a running 5-0 Prolene anastomosis with the help of my assistant.  We then made multiple counterincisions on the right upper arm and tediously dissected out the graft where we had transected this off the arterial anastomosis leaving a short stump on the artery after this had been ligated with a 2-0 silk.  We freed up all the graft and got it completely excised.  The arterial anastomosis was very scarred in and I did not feel it was safe to continue dissecting.  Ultimately I placed a tourniquet on the upper arm and then the patient was given 6000 units IV heparin  and exsanguinated the arm.  Tourniquet was inflated to 250 mm Hg and and I took the arterial anastomosis of the graft off the brachial artery using 11 blade scalpel.  The vein graft was then brought on the field  and this was spatulated with the segment of brachial vein that we had harvested up in the axilla we performed a vein patch angioplasty to the brachial artery using 6-0 Prolene parachute technique with the help of my assistant we came down the tourniquet and flush the brachial artery.  All the incisions were copiously irrigated.  We then closed all these incisions 3-0 Vicryl 4-0 Monocryl Dermabond.  We wrapped the arm with a gentle Ace.  Complication: None  Condition:  Stable  Lonni DOROTHA Gaskins, MD [facility] Office: [phone]   Lonni JINNY Gaskins

## 2024-09-12 NOTE — Transfer of Care (Signed)
 Immediate Anesthesia Transfer of Care Note  Patient: Jorge [name]  Procedure(s) Performed: EXCISION OF RIGHT UPPER ARM VEIN GRAFT (Right: Arm Upper) VEIN PATCH OF BRACHIAL ARTERY (Right: Arm Upper)  Patient Location: PACU  Anesthesia Type:General  Level of Consciousness: awake, alert , and oriented  Airway & Oxygen Therapy: Patient Spontanous Breathing  Post-op Assessment: Report given to RN, Post -op Vital signs reviewed and stable, and Patient moving all extremities  Post vital signs: Reviewed and stable  Last Vitals:  Vitals Value Taken Time  BP 145/99 09/12/24 17:40  Temp    Pulse 94 09/12/24 17:42  Resp 15 09/12/24 17:42  SpO2 97 % 09/12/24 17:42  Vitals shown include unfiled device data.  Last Pain:  Vitals:   09/12/24 1341  TempSrc: Oral  PainSc: 0-No pain         Complications: No notable events documented.

## 2024-09-12 NOTE — Consult Note (Signed)
  [facility]  INPATIENT CONSULTATION  Reason for Consultation: ESRD Requesting Provider: Dr. Drusilla  HPI: Jorge [name] is an 43 y.o. male with ESRD on HD [facility], ashtema, HTN, SLE in remission currently admitted for a dialysis access abscess who is seen by nephrology for comanagement of his ESRD and associated conditions.   Presented to [facility] ED yesterday for painful area in RUE near his AVG which was not being used due to steal syndrome.  CT imaging showed abscess and surgery was consulted for management.  Plans for OR today to excise, wash out.    Patient currently undergoing HD this AM.  Initial K 6.7 yesterday but 4.8 this AM after med mgmt.  He is afebrile with a normal WBC count.    He has no current c/o.   PMH: Past Medical History:  Diagnosis Date   Asthma    daily inhaler/neb.   Bronchopulmonary dysplasia (HCC)    GERD (gastroesophageal reflux disease)    no current med.   Hypertension    states BP runs high, has been on meds. x 5-6 yrs.   Lupus    kidneys, lungs, heart 01-18-16 no recent problems-no steroid use recently   Renal disorder    Subcutaneous nodules 09/2017   bilat. thigh, left forearm, left axilla   PSH: Past Surgical History:  Procedure Laterality Date   93 HOUR PH STUDY N/A 03/20/2016   Procedure: 24 HOUR PH STUDY;  Surgeon: Belvie Just, MD;  Location: [facility];  Service: Endoscopy;  Laterality: N/A;   DIALYSIS/PERMA CATHETER INSERTION     ESOPHAGEAL MANOMETRY N/A 03/20/2016   Procedure: ESOPHAGEAL MANOMETRY (EM);  Surgeon: Belvie Just, MD;  Location: [facility];  Service: Endoscopy;  Laterality: N/A;   ESOPHAGOGASTRODUODENOSCOPY (EGD) WITH ESOPHAGEAL DILATION  08/19/2012   ESOPHAGOGASTRODUODENOSCOPY (EGD) WITH PROPOFOL  N/A 05/29/2014   Procedure: ESOPHAGOGASTRODUODENOSCOPY (EGD) WITH PROPOFOL ;  Surgeon: Belvie JONETTA Just, MD;  Location: [facility];  Service: Endoscopy;  Laterality: N/A;   ESOPHAGOGASTRODUODENOSCOPY (EGD)  WITH PROPOFOL  N/A 07/24/2014   Procedure: ESOPHAGOGASTRODUODENOSCOPY (EGD) WITH PROPOFOL ;  Surgeon: Belvie JONETTA Just, MD;  Location: [facility];  Service: Endoscopy;  Laterality: N/A;   ESOPHAGOGASTRODUODENOSCOPY (EGD) WITH PROPOFOL  N/A 01/28/2016   Procedure: ESOPHAGOGASTRODUODENOSCOPY (EGD) WITH PROPOFOL ;  Surgeon: Belvie Just, MD;  Location: [facility];  Service: Endoscopy;  Laterality: N/A;   MASS EXCISION N/A 10/16/2017   Procedure: EXCISION MASS LEFT THIGH, RIGHT THIGH, LEFT FOREARM, LEFT AXILLA;  Surgeon: Vanderbilt Ned, MD;  Location: [facility];  Service: General;  Laterality: N/A;   PH IMPEDANCE STUDY N/A 03/20/2016   Procedure: PH IMPEDANCE STUDY;  Surgeon: Belvie Just, MD;  Location: [facility];  Service: Endoscopy;  Laterality: N/A;   TONSILLECTOMY     WISDOM TOOTH EXTRACTION      Past Medical History:  Diagnosis Date   Asthma    daily inhaler/neb.   Bronchopulmonary dysplasia (HCC)    GERD (gastroesophageal reflux disease)    no current med.   Hypertension    states BP runs high, has been on meds. x 5-6 yrs.   Lupus    kidneys, lungs, heart 01-18-16 no recent problems-no steroid use recently   Renal disorder    Subcutaneous nodules 09/2017   bilat. thigh, left forearm, left axilla    Medications:  I have reviewed the patient's current medications.  Medications Prior to Admission  Medication Sig Dispense Refill   albuterol  (PROVENTIL  HFA;VENTOLIN  HFA) 108 (90 Base) MCG/ACT inhaler Inhale 2 puffs into the  lungs every 4 (four) hours as needed for wheezing or shortness of breath. 1 Inhaler 1   amLODipine  (NORVASC ) 10 MG tablet Take 10 mg by mouth every morning.     hydroxychloroquine  (PLAQUENIL ) 200 MG tablet Take 200 mg by mouth every morning.     labetalol  (NORMODYNE ) 100 MG tablet Take 100 mg by mouth 2 (two) times daily.     montelukast  (SINGULAIR ) 10 MG tablet Take 10 mg by mouth daily.     sevelamer  carbonate (RENVELA ) 800 MG tablet TAKE  TWO TABLETS BY MOUTH WITH MEALS AND 1 WITH SNACKS. SWALLOW TABLET WHOLE. DO NOT CRUSH, BREAK, OR CHEW      ALLERGIES:   Allergies  Allergen Reactions   Vancomycin Hives   Esomeprazole Rash    FAM HX: Family History  Problem Relation Age of Onset   Lupus Father    HIV Brother    Thyroid cancer Maternal Aunt     Social History:   reports that he has never smoked. He has never used smokeless tobacco. He reports that he does not currently use drugs after having used the following drugs: Marijuana. He reports that he does not drink alcohol.  ROS: 12 system ROS neg except per HPI   Blood pressure (!) 138/95, pulse 90, temperature 98.1 F (36.7 C), resp. rate 16, height 5' 8 (1.727 m), weight 65.8 kg, SpO2 (!) 85%. PHYSICAL EXAM: Gen: comfortable in bed  Eyes:  glasses ENT: MMM Neck: RIJ TDC running reversed, dressing c/d/i CV: RRR  Lungs: clear Extr: RUE swelling near [facility] fossa, no erythema on skin Neuro: AOx3   Results for orders placed or performed during the hospital encounter of 09/11/24 (from the past 48 hours)  CBC with Differential     Status: None   Collection Time: 09/11/24  7:39 PM  Result Value Ref Range   WBC 6.3 4.0 - 10.5 K/uL   RBC 4.92 4.22 - 5.81 MIL/uL   Hemoglobin 13.4 13.0 - 17.0 g/dL   HCT 56.5 60.9 - 47.9 %   MCV 88.2 80.0 - 100.0 fL   MCH 27.2 26.0 - 34.0 pg   MCHC 30.9 30.0 - 36.0 g/dL   RDW 85.8 88.4 - 84.4 %   Platelets 197 150 - 400 K/uL   nRBC 0.0 0.0 - 0.2 %   Neutrophils Relative % 64 %   Neutro Abs 4.0 1.7 - 7.7 K/uL   Lymphocytes Relative 21 %   Lymphs Abs 1.4 0.7 - 4.0 K/uL   Monocytes Relative 14 %   Monocytes Absolute 0.9 0.1 - 1.0 K/uL   Eosinophils Relative 1 %   Eosinophils Absolute 0.1 0.0 - 0.5 K/uL   Basophils Relative 0 %   Basophils Absolute 0.0 0.0 - 0.1 K/uL   Immature Granulocytes 0 %   Abs Immature Granulocytes 0.02 0.00 - 0.07 K/uL    Comment: Performed at [facility], [address], [place], [place]  [address]  Comprehensive metabolic panel     Status: Abnormal   Collection Time: 09/11/24  7:39 PM  Result Value Ref Range   Sodium 139 135 - 145 mmol/L   Potassium 6.7 (HH) 3.5 - 5.1 mmol/L    Comment: CRITICAL RESULT CALLED TO, READ BACK BY AND VERIFIED WITH B.SANGALANG,RN @2054  09/11/2024 VANG.J   Chloride 103 98 - 111 mmol/L   CO2 22 22 - 32 mmol/L   Glucose, Bld 82 70 - 99 mg/dL    Comment: Glucose reference range applies only to  samples taken after fasting for at least 8 hours.   BUN 52 (H) 6 - 20 mg/dL   Creatinine, Ser 89.43 (H) 0.61 - 1.24 mg/dL   Calcium 8.7 (L) 8.9 - 10.3 mg/dL   Total Protein 9.3 (H) 6.5 - 8.1 g/dL   Albumin  4.0 3.5 - 5.0 g/dL   AST 16 15 - 41 U/L   ALT 15 0 - 44 U/L   Alkaline Phosphatase 72 38 - 126 U/L   Total Bilirubin 0.4 0.0 - 1.2 mg/dL   GFR, Estimated 6 (L) >60 mL/min    Comment: (NOTE) Calculated using the CKD-EPI Creatinine Equation (2021)    Anion gap 14 5 - 15    Comment: Performed at [facility], [address], [place], [place] [address]  Blood culture (routine x 2)     Status: None (Preliminary result)   Collection Time: 09/11/24  7:39 PM   Specimen: BLOOD LEFT HAND  Result Value Ref Range   Specimen Description BLOOD LEFT HAND    Special Requests      BOTTLES DRAWN AEROBIC AND ANAEROBIC Blood Culture results may not be optimal due to an inadequate volume of blood received in culture bottles   Culture      NO GROWTH < 12 HOURS Performed at [facility], [address] [place], [place] [address]    Report Status PENDING   I-Stat CG4 Lactic Acid, ED     Status: None   Collection Time: 09/11/24  7:43 PM  Result Value Ref Range   Lactic Acid, Venous 1.4 0.5 - 1.9 mmol/L  CBC     Status: Abnormal   Collection Time: 09/12/24 12:27 AM  Result Value Ref Range   WBC 7.0 4.0 - 10.5 K/uL   RBC 4.46 4.22 - 5.81 MIL/uL   Hemoglobin 12.1 (L) 13.0 - 17.0 g/dL   HCT 59.6 60.9 - 47.9 %   MCV 90.4 80.0 - 100.0 fL   MCH 27.1 26.0 -  34.0 pg   MCHC 30.0 30.0 - 36.0 g/dL   RDW 85.7 88.4 - 84.4 %   Platelets 149 (L) 150 - 400 K/uL   nRBC 0.0 0.0 - 0.2 %    Comment: Performed at [facility], [address] [place], [place] [address]  Renal function panel     Status: Abnormal   Collection Time: 09/12/24 12:28 AM  Result Value Ref Range   Sodium 138 135 - 145 mmol/L   Potassium 5.2 (H) 3.5 - 5.1 mmol/L    Comment: HEMOLYSIS AT THIS LEVEL MAY AFFECT RESULT   Chloride 102 98 - 111 mmol/L   CO2 21 (L) 22 - 32 mmol/L   Glucose, Bld 101 (H) 70 - 99 mg/dL    Comment: Glucose reference range applies only to samples taken after fasting for at least 8 hours.   BUN 57 (H) 6 - 20 mg/dL   Creatinine, Ser 89.50 (H) 0.61 - 1.24 mg/dL   Calcium 7.9 (L) 8.9 - 10.3 mg/dL   Phosphorus 6.1 (H) 2.5 - 4.6 mg/dL   Albumin  3.1 (L) 3.5 - 5.0 g/dL   GFR, Estimated 6 (L) >60 mL/min    Comment: (NOTE) Calculated using the CKD-EPI Creatinine Equation (2021)    Anion gap 15 5 - 15    Comment: Performed at [facility], [address], [place], [place] [address]  Lactic acid, plasma     Status: Abnormal   Collection Time: 09/12/24  3:22 AM  Result Value Ref Range   Lactic Acid, Venous 2.7 (HH) 0.5 - 1.9 mmol/L    Comment: CRITICAL RESULT CALLED TO, READ BACK BY AND VERIFIED WITH CANDIE ADES, RN AT [phone] 12.05.25 JLASIGAN Performed at [facility], [address] [place], [place] [address]   Lactic acid, plasma     Status: Abnormal   Collection Time: 09/12/24  4:55 AM  Result Value Ref Range   Lactic Acid, Venous 2.0 (HH) 0.5 - 1.9 mmol/L    Comment: CRITICAL RESULT CALLED TO, READ BACK BY AND VERIFIED WITH ADES RAMAN, RN [phone] 09/12/2024 SANDOVAL K Performed at [facility], [address] [place], [place] [address]   Basic metabolic panel     Status: Abnormal   Collection Time: 09/12/24  4:55 AM  Result Value Ref Range   Sodium 138 135 - 145 mmol/L   Potassium 4.8 3.5 - 5.1 mmol/L   Chloride 101 98 - 111  mmol/L   CO2 18 (L) 22 - 32 mmol/L   Glucose, Bld 96 70 - 99 mg/dL    Comment: Glucose reference range applies only to samples taken after fasting for at least 8 hours.   BUN 59 (H) 6 - 20 mg/dL   Creatinine, Ser 89.38 (H) 0.61 - 1.24 mg/dL   Calcium 8.1 (L) 8.9 - 10.3 mg/dL   GFR, Estimated 6 (L) >60 mL/min    Comment: (NOTE) Calculated using the CKD-EPI Creatinine Equation (2021)    Anion gap 19 (H) 5 - 15    Comment: Performed at [facility], [address] [place], [place] [address]  CBC     Status: Abnormal   Collection Time: 09/12/24  4:55 AM  Result Value Ref Range   WBC 5.4 4.0 - 10.5 K/uL   RBC 4.00 (L) 4.22 - 5.81 MIL/uL   Hemoglobin 10.9 (L) 13.0 - 17.0 g/dL   HCT 63.8 (L) 60.9 - 47.9 %   MCV 90.3 80.0 - 100.0 fL   MCH 27.3 26.0 - 34.0 pg   MCHC 30.2 30.0 - 36.0 g/dL   RDW 85.8 88.4 - 84.4 %   Platelets 176 150 - 400 K/uL   nRBC 0.0 0.0 - 0.2 %    Comment: Performed at [facility], [address], [place], [place] [address]    CT HUMERUS RIGHT W CONTRAST Result Date: 09/11/2024 CLINICAL DATA:  Soft tissue mass of upper arm. Recent graft placement. EXAM: CT OF THE UPPER RIGHT EXTREMITY WITH CONTRAST TECHNIQUE: Multidetector CT imaging of the upper right extremity was performed according to the standard protocol following intravenous contrast administration. RADIATION DOSE REDUCTION: This exam was performed according to the departmental dose-optimization program which includes automated exposure control, adjustment of the mA and/or kV according to patient size and/or use of iterative reconstruction technique. CONTRAST:  75mL OMNIPAQUE  IOHEXOL  350 MG/ML SOLN COMPARISON:  None Available. FINDINGS: There is no acute fracture or focal osseous lesion. There is no significant elbow or shoulder joint effusion. A graft is present connecting the left axillary vein to a vessel at the level of the distal humerus. Graft is grossly patent, but evaluation is limited secondary  to timing of the contrast bolus. There is mild subcutaneous stranding surrounding the entire graft. Adjacent to the distal 2 cm of the graft there is an enhancing fluid collection measuring 2.7 x 2.2 by 2.8 cm. This abuts the graft. There is no contrast opacification within this collection. There is  surrounding inflammatory stranding. Multiple superficial collateral vessels are seen along the right anterior chest wall and within the proximal right arm. There is a right upper lobe nodule measuring 5 mm on image 4/50. IMPRESSION: 1. 2.8 cm enhancing fluid collection adjacent to the distal 2 cm of the graft in the right upper extremity. This is concerning for abscess. 2. Mild subcutaneous stranding surrounding the entire graft. 3. 5 mm right upper lobe pulmonary nodule. No follow-up needed if patient is low-risk.This recommendation follows the consensus statement: Guidelines for Management of Incidental Pulmonary Nodules Detected on CT Images: From the Fleischner Society 2017; Radiology 2017; 284:228-243. Electronically Signed   By: Greig Pique M.D.   On: 09/11/2024 23:11    Assessment/PlanNathaniel Spiewak is an 43 y.o. male with ESRD on HD [facility], ashtema, HTN, SLE in remission currently admitted for a dialysis access abscess who is seen by nephrology for comanagement of his ESRD and associated conditions.   **Dialysis access abscess:  plan for excision in OR today per surgery.   **ESRD on HD: usual MWF [facility] HD currently.  Daily labs for now.   **HTN/Volume: BP initially high,  130s currently, on home antiHTN.  UF 1-2L with HD today, standing post weights as able. 2g sodium/1266mL fluid diet.   **Anemia:  Hb 10.9, no indication for ESA currently.    **BMM: cont outpt renvela . Phos 6.1. Renal diet.   **Hyperkalemia: resolved with med mgmt, HD today, renal diet.   Will follow, reach out with concerns.    Manuelita DELENA Barters 09/12/2024, 9:10 AM

## 2024-09-12 NOTE — ED Notes (Signed)
 RN attempted to collect labs. Pt right arm is restricted and pt doesn't have any palpable veins on left side. Pt states the IV placed in his wrist is painful with touch and medication infusing is uncomfortable.  IV team consult placed for new access and removal of IV

## 2024-09-12 NOTE — ED Notes (Signed)
 Pt states he is anxious d/t current situation.Pt usually able to sleep with no problems. Pt requesting pain medicine and sleep aide   RN notified attending. Awaiting next steps

## 2024-09-12 NOTE — Progress Notes (Signed)
 Pt arrived to HD with a cup of coffee that he had been drinking---found out that this pt was supposed to have been made NPO in the ER for his scheduled surgery time today---Dr Norine was made aware and she will notify the vascular surgeon of this--I notified the charge RN of this per order--pt was immediately made NPO at this point--approx [phone]

## 2024-09-12 NOTE — Assessment & Plan Note (Addendum)
-   This is at the site of his AV fistula. - The patient will be placed on IV antibiotic therapy with Zyvox  and cefepime .  He is allergic to vancomycin. - Warm compresses will be utilized. - Pain management will be provided.

## 2024-09-13 DIAGNOSIS — L03113 Cellulitis of right upper limb: Secondary | ICD-10-CM | POA: Diagnosis not present

## 2024-09-13 DIAGNOSIS — I160 Hypertensive urgency: Secondary | ICD-10-CM | POA: Diagnosis not present

## 2024-09-13 DIAGNOSIS — N186 End stage renal disease: Secondary | ICD-10-CM | POA: Diagnosis not present

## 2024-09-13 DIAGNOSIS — E875 Hyperkalemia: Secondary | ICD-10-CM | POA: Diagnosis not present

## 2024-09-13 LAB — CBC
HCT: 30.2 % — ABNORMAL LOW (ref 39.0–52.0)
Hemoglobin: 9.7 g/dL — ABNORMAL LOW (ref 13.0–17.0)
MCH: 27.8 pg (ref 26.0–34.0)
MCHC: 32.1 g/dL (ref 30.0–36.0)
MCV: 86.5 fL (ref 80.0–100.0)
Platelets: 158 K/uL (ref 150–400)
RBC: 3.49 MIL/uL — ABNORMAL LOW (ref 4.22–5.81)
RDW: 13.7 % (ref 11.5–15.5)
WBC: 4.6 K/uL (ref 4.0–10.5)
nRBC: 0 % (ref 0.0–0.2)

## 2024-09-13 LAB — COMPREHENSIVE METABOLIC PANEL WITH GFR
ALT: 9 U/L (ref 0–44)
AST: 13 U/L — ABNORMAL LOW (ref 15–41)
Albumin: 2.9 g/dL — ABNORMAL LOW (ref 3.5–5.0)
Alkaline Phosphatase: 50 U/L (ref 38–126)
Anion gap: 13 (ref 5–15)
BUN: 35 mg/dL — ABNORMAL HIGH (ref 6–20)
CO2: 23 mmol/L (ref 22–32)
Calcium: 8.6 mg/dL — ABNORMAL LOW (ref 8.9–10.3)
Chloride: 96 mmol/L — ABNORMAL LOW (ref 98–111)
Creatinine, Ser: 8.04 mg/dL — ABNORMAL HIGH (ref 0.61–1.24)
GFR, Estimated: 8 mL/min — ABNORMAL LOW (ref >60)
Glucose, Bld: 134 mg/dL — ABNORMAL HIGH (ref 70–99)
Potassium: 5.6 mmol/L — ABNORMAL HIGH (ref 3.5–5.1)
Sodium: 132 mmol/L — ABNORMAL LOW (ref 135–145)
Total Bilirubin: 0.3 mg/dL (ref 0.0–1.2)
Total Protein: 7 g/dL (ref 6.5–8.1)

## 2024-09-13 LAB — HEPATITIS B SURFACE ANTIBODY, QUANTITATIVE: Hep B S AB Quant (Post): 15.2 m[IU]/mL

## 2024-09-13 LAB — POTASSIUM: Potassium: 4.8 mmol/L (ref 3.5–5.1)

## 2024-09-13 LAB — PHOSPHORUS: Phosphorus: 7.5 mg/dL — ABNORMAL HIGH (ref 2.5–4.6)

## 2024-09-13 MED ORDER — SEVELAMER CARBONATE 800 MG PO TABS
1600.0000 mg | ORAL_TABLET | Freq: Three times a day (TID) | ORAL | Status: DC
  Administered 2024-09-13 – 2024-09-25 (×30): 1600 mg via ORAL
  Filled 2024-09-13 (×34): qty 2

## 2024-09-13 MED ORDER — SODIUM ZIRCONIUM CYCLOSILICATE 10 G PO PACK
10.0000 g | PACK | Freq: Once | ORAL | Status: AC
  Administered 2024-09-13: 10 g via ORAL
  Filled 2024-09-13: qty 1

## 2024-09-13 NOTE — Progress Notes (Signed)
 [facility] Progress Note   Subjective:   Patient seen and examined at bedside.  Reports surgery went well yesterday.  Denies pain today.  Admits to struggling with low K diet and eating a banana.  Tolerated dialysis well yesterday.  Denies fever, chills, CP, SOB, abdominal pain, muscle weakness, nausea and vomiting.     Objective Vitals:   09/12/24 2300 09/13/24 0000 09/13/24 0300 09/13/24 0734  BP: 133/86 133/86 131/86 119/71  Pulse: 83  94 84  Resp: 13 11 15 14   Temp: 98 F (36.7 C)  98.4 F (36.9 C) 98.5 F (36.9 C)  TempSrc: Oral  Oral Oral  SpO2:      Weight:      Height:       Physical Exam General:alert, well appearing male in NAD Heart:RRR, no mrg Lungs:CTAB, nml WOB on RA Abdomen:soft , NTND  Extremities:no LE edema, Right arm dressed in ACE wrap Dialysis Access: [facility]   Filed Weights   09/11/24 1837 09/12/24 0804 09/12/24 1145  Weight: 65.8 kg 65.8 kg 65 kg    Intake/Output Summary (Last 24 hours) at 09/13/2024 1139 Last data filed at 09/13/2024 0900 Gross per 24 hour  Intake 1990 ml  Output 1050 ml  Net 940 ml    Additional Objective Labs: Basic Metabolic Panel: Recent Labs  Lab 09/12/24 0028 09/12/24 0455 09/12/24 1340 09/13/24 0250  NA 138 138 136 132*  K 5.2* 4.8 4.5 5.6*  CL 102 101 96* 96*  CO2 21* 18*  --  23  GLUCOSE 101* 96 83 134*  BUN 57* 59* 29* 35*  CREATININE 10.49* 10.61* 6.50* 8.04*  CALCIUM 7.9* 8.1*  --  8.6*  PHOS 6.1*  --   --  7.5*   Liver Function Tests: Recent Labs  Lab 09/11/24 1939 09/12/24 0028 09/13/24 0250  AST 16  --  13*  ALT 15  --  9  ALKPHOS 72  --  50  BILITOT 0.4  --  0.3  PROT 9.3*  --  7.0  ALBUMIN  4.0 3.1* 2.9*    CBC: Recent Labs  Lab 09/11/24 1939 09/12/24 0027 09/12/24 0455 09/12/24 1340 09/13/24 0250  WBC 6.3 7.0 5.4  --  4.6  NEUTROABS 4.0  --   --   --   --   HGB 13.4 12.1* 10.9* 13.6 9.7*  HCT 43.4 40.3 36.1* 40.0 30.2*  MCV 88.2 90.4 90.3  --  86.5  PLT 197 149*  176  --  158   Blood Culture    Component Value Date/Time   SDES WOUND RIGHT ARM 09/12/2024 1541   SPECREQUEST SWABS 09/12/2024 1541   CULT  09/12/2024 1541    TOO YOUNG TO READ Performed at [facility], [address] [place], [place] [address]    REPTSTATUS PENDING 09/12/2024 1541    Studies/Results: CT HUMERUS RIGHT W CONTRAST Result Date: 09/11/2024 CLINICAL DATA:  Soft tissue mass of upper arm. Recent graft placement. EXAM: CT OF THE UPPER RIGHT EXTREMITY WITH CONTRAST TECHNIQUE: Multidetector CT imaging of the upper right extremity was performed according to the standard protocol following intravenous contrast administration. RADIATION DOSE REDUCTION: This exam was performed according to the departmental dose-optimization program which includes automated exposure control, adjustment of the mA and/or kV according to patient size and/or use of iterative reconstruction technique. CONTRAST:  75mL OMNIPAQUE  IOHEXOL  350 MG/ML SOLN COMPARISON:  None Available. FINDINGS: There is no acute fracture or focal osseous lesion. There is no significant elbow  or shoulder joint effusion. A graft is present connecting the left axillary vein to a vessel at the level of the distal humerus. Graft is grossly patent, but evaluation is limited secondary to timing of the contrast bolus. There is mild subcutaneous stranding surrounding the entire graft. Adjacent to the distal 2 cm of the graft there is an enhancing fluid collection measuring 2.7 x 2.2 by 2.8 cm. This abuts the graft. There is no contrast opacification within this collection. There is surrounding inflammatory stranding. Multiple superficial collateral vessels are seen along the right anterior chest wall and within the proximal right arm. There is a right upper lobe nodule measuring 5 mm on image 4/50. IMPRESSION: 1. 2.8 cm enhancing fluid collection adjacent to the distal 2 cm of the graft in the right upper extremity. This is concerning for  abscess. 2. Mild subcutaneous stranding surrounding the entire graft. 3. 5 mm right upper lobe pulmonary nodule. No follow-up needed if patient is low-risk.This recommendation follows the consensus statement: Guidelines for Management of Incidental Pulmonary Nodules Detected on CT Images: From the Fleischner Society 2017; Radiology 2017; 284:228-243. Electronically Signed   By: Greig Pique M.D.   On: 09/11/2024 23:11    Medications:  ceFEPime  (MAXIPIME ) IV 200 mL/hr at 09/13/24 0700   linezolid  (ZYVOX ) IV 600 mg (09/13/24 0458)    amLODipine   10 mg Oral q morning   Chlorhexidine  Gluconate Cloth  6 each Topical Q0600   heparin   5,000 Units Subcutaneous Q8H   hydroxychloroquine   200 mg Oral BH-q7a   labetalol   100 mg Oral BID   montelukast   10 mg Oral Daily   sevelamer  carbonate  800 mg Oral TID WC    Dialysis Orders: [facility] - MWF  Assessment/Plan: 1. AVG infection and seroma - excision of RU AVG and seroma drainage, with vein patch angioplasty yesterday by Dr. Gretta.  BC with gram positive cocci.  On ABX.   2. ESRD -on HD MWF.  HD yesterday tolerated well.  Next HD on 09/15/24.  3. Hyperkalemia - 2/2 dietary indiscretions.  Discussed importance of low K diet.  Lokelma  ordered.  Repeat K later today.  4. Anemia of CKD- Hgb 9-10 range.  No indication for ESA at this time.   5. Secondary hyperparathyroidism - Ca in goal. Phos elevated. On renvela  2 AC TID, senispar 90mg  qHD.  6. HTN/volume - Blood pressure in goal. Continue home meds. Does not appear volume overloaded.  UF as tolerated.  7. Nutrition -  Renal diet w/fluid restrictions. Reviewed low K diet.   8. SLE - on plaquenil .   Manuelita Labella, PA-C [facility] 09/13/2024,11:39 AM  LOS: 1 day

## 2024-09-13 NOTE — Plan of Care (Signed)
  Problem: Clinical Measurements: Goal: Ability to avoid or minimize complications of infection will improve Outcome: Progressing   Problem: Skin Integrity: Goal: Skin integrity will improve Outcome: Progressing   Problem: Education: Goal: Knowledge of General Education information will improve Description: Including pain rating scale, medication(s)/side effects and non-pharmacologic comfort measures Outcome: Progressing   Problem: Health Behavior/Discharge Planning: Goal: Ability to manage health-related needs will improve Outcome: Progressing   Problem: Clinical Measurements: Goal: Ability to maintain clinical measurements within normal limits will improve Outcome: Progressing Goal: Will remain free from infection Outcome: Progressing Goal: Diagnostic test results will improve Outcome: Progressing Goal: Respiratory complications will improve Outcome: Progressing Goal: Cardiovascular complication will be avoided Outcome: Progressing   Problem: Activity: Goal: Risk for activity intolerance will decrease Outcome: Progressing   Problem: Nutrition: Goal: Adequate nutrition will be maintained Outcome: Progressing   Problem: Coping: Goal: Level of anxiety will decrease Outcome: Progressing   Problem: Elimination: Goal: Will not experience complications related to bowel motility Outcome: Progressing Goal: Will not experience complications related to urinary retention Outcome: Progressing   Problem: Pain Managment: Goal: General experience of comfort will improve and/or be controlled Outcome: Progressing   Problem: Safety: Goal: Ability to remain free from injury will improve Outcome: Progressing   Problem: Skin Integrity: Goal: Risk for impaired skin integrity will decrease Outcome: Progressing

## 2024-09-13 NOTE — Plan of Care (Signed)
  Problem: Education: Goal: Knowledge of General Education information will improve Description: Including pain rating scale, medication(s)/side effects and non-pharmacologic comfort measures Outcome: Progressing   Problem: Clinical Measurements: Goal: Cardiovascular complication will be avoided Outcome: Progressing   Problem: Activity: Goal: Risk for activity intolerance will decrease Outcome: Progressing   Problem: Safety: Goal: Ability to remain free from injury will improve Outcome: Progressing   

## 2024-09-13 NOTE — Progress Notes (Addendum)
 [facility]  PROGRESS NOTE  Jorge [name] FMW:[id number] DOB: Feb 01, 1981 DOA: 09/11/2024 PCP: Austin Mutton, MD   Brief HPI:    43 y.o. African-American male with medical history significant for asthma, GERD, essential hypertension, and SLE, who presented to the emergency room with acute onset of right arm swelling and pain at the site of his AV fistula with associated warmth.  He denied any fever or chills.  No nausea or vomiting or abdominal pain.  He has been getting his hemodialysis through his right chest port.  His AV fistula has been used apparently due to steal syndrome.  He denied any cough or wheezing or hemoptysis.  No chest pain or palpitations.  No bleeding diathesis.  No dysuria, oliguria or hematuria or flank pain.    Imaging: CT of the right humerus with contrast revealed the following: 1. 2.8 cm enhancing fluid collection adjacent to the distal 2 cm of the graft in the right upper extremity. This is concerning for abscess. 2. Mild subcutaneous stranding surrounding the entire graft. 3. 5 mm right upper lobe pulmonary nodule.      Assessment/Plan:    Hyperkalemia - Initially came with potassium of 6.7, improved with hemodialysis -This morning potassium is 5.6 -Management per nephrology - Status post Lokelma  10 g p.o. x 1 -Recheck potassium today   Infected right arm AV graft, status post drainage of the abscess -Vascular surgery was consulted -Underwent drainage of the abscess yesterday - Follow-up culture results - Continue linezolid , cefepime     Hypertensive urgency - Resolved -Continue amlodipine  -Continue labetalol    ESRD on hemodialysis (HCC) -Gets dialysis Monday Wednesday and Friday Nephrology following        DVT prophylaxis: heparin   Medications     amLODipine   10 mg Oral q morning   Chlorhexidine  Gluconate Cloth  6 each Topical Q0600   heparin   5,000 Units Subcutaneous Q8H   hydroxychloroquine   200 mg Oral BH-q7a   labetalol   100  mg Oral BID   montelukast   10 mg Oral Daily   sevelamer  carbonate  800 mg Oral TID WC     Data Reviewed:   CBG:  No results for input(s): GLUCAP in the last 168 hours.  SpO2:  (patient does not have a working O2 sensor)    Vitals:   09/12/24 2300 09/13/24 0000 09/13/24 0300 09/13/24 0734  BP: 133/86 133/86 131/86 119/71  Pulse: 83  94 84  Resp: 13 11 15 14   Temp: 98 F (36.7 C)  98.4 F (36.9 C) 98.5 F (36.9 C)  TempSrc: Oral  Oral Oral  SpO2:      Weight:      Height:          Data Reviewed:  Basic Metabolic Panel: Recent Labs  Lab 09/11/24 1939 09/12/24 0028 09/12/24 0455 09/12/24 1340 09/13/24 0250  NA 139 138 138 136 132*  K 6.7* 5.2* 4.8 4.5 5.6*  CL 103 102 101 96* 96*  CO2 22 21* 18*  --  23  GLUCOSE 82 101* 96 83 134*  BUN 52* 57* 59* 29* 35*  CREATININE 10.56* 10.49* 10.61* 6.50* 8.04*  CALCIUM 8.7* 7.9* 8.1*  --  8.6*  PHOS  --  6.1*  --   --  7.5*    CBC: Recent Labs  Lab 09/11/24 1939 09/12/24 0027 09/12/24 0455 09/12/24 1340 09/13/24 0250  WBC 6.3 7.0 5.4  --  4.6  NEUTROABS 4.0  --   --   --   --  HGB 13.4 12.1* 10.9* 13.6 9.7*  HCT 43.4 40.3 36.1* 40.0 30.2*  MCV 88.2 90.4 90.3  --  86.5  PLT 197 149* 176  --  158    LFT Recent Labs  Lab 09/11/24 1939 09/12/24 0028 09/13/24 0250  AST 16  --  13*  ALT 15  --  9  ALKPHOS 72  --  50  BILITOT 0.4  --  0.3  PROT 9.3*  --  7.0  ALBUMIN  4.0 3.1* 2.9*     Antibiotics: Anti-infectives (From admission, onward)    Start     Dose/Rate Route Frequency Ordered Stop   09/12/24 1000  linezolid  (ZYVOX ) IVPB 600 mg  Status:  Discontinued        600 mg 300 mL/hr over 60 Minutes Intravenous Every 12 hours 09/12/24 0355 09/12/24 0359   09/12/24 0700  hydroxychloroquine  (PLAQUENIL ) tablet 200 mg        200 mg Oral BH-each morning 09/12/24 0336     09/12/24 0500  ceFEPIme  (MAXIPIME ) 1 g in sodium chloride  0.9 % 100 mL IVPB        1 g 200 mL/hr over 30 Minutes Intravenous Every  24 hours 09/12/24 0355     09/11/24 2315  cefTRIAXone  (ROCEPHIN ) 2 g in sodium chloride  0.9 % 100 mL IVPB        2 g 200 mL/hr over 30 Minutes Intravenous  Once 09/11/24 2304 09/12/24 0238   09/11/24 2315  linezolid  (ZYVOX ) IVPB 600 mg        600 mg 300 mL/hr over 60 Minutes Intravenous Every 12 hours 09/11/24 2307          CONSULTS vascular surgery  Code Status: Full code  Family Communication:      Subjective    Appears in no acute distress  Objective    Physical Examination:  General-appears in no acute distress Heart-S1-S2, regular, no murmur auscultated Lungs-clear to auscultation bilaterally, no wheezing or crackles auscultated Abdomen-soft, nontender, no organomegaly Extremities-right upper extremity in dressing Neuro-alert, oriented x3, no focal deficit noted             Inaki Vantine [address]   [facility] If 7PM-7AM, please contact night-coverage at [email or web address], Office  [phone]   09/13/2024, 7:55 AM  LOS: 1 day

## 2024-09-13 NOTE — Progress Notes (Signed)
 [facility]  Subjective  -no complaints   Objective 119/71 84 98.5 F (36.9 C) (Oral) 14 99%  Intake/Output Summary (Last 24 hours) at 09/13/2024 1012 Last data filed at 09/13/2024 0900 Gross per 24 hour  Intake 1990 ml  Output 1050 ml  Net 940 ml    Right upper arm incisions without hematoma Right radial pulse palpable  Laboratory Lab Results: Recent Labs    09/12/24 0455 09/12/24 1340 09/13/24 0250  WBC 5.4  --  4.6  HGB 10.9* 13.6 9.7*  HCT 36.1* 40.0 30.2*  PLT 176  --  158   BMET Recent Labs    09/12/24 0455 09/12/24 1340 09/13/24 0250  NA 138 136 132*  K 4.8 4.5 5.6*  CL 101 96* 96*  CO2 18*  --  23  GLUCOSE 96 83 134*  BUN 59* 29* 35*  CREATININE 10.61* 6.50* 8.04*  CALCIUM 8.1*  --  8.6*    COAG Lab Results  Component Value Date   INR 1.1 01/14/2024   No results found for: PTT  Assessment/Planning:  43 year old male postop day 1 status post excision of right arm AV graft with vein patch of the brachial artery.  Incisions look good.  Palpable radial pulse.  I rewrapped his arm with a gentle Ace bandage.  No growth on cultures so far.  Jorge [name] 09/13/2024 10:12 AM --

## 2024-09-13 NOTE — Anesthesia Postprocedure Evaluation (Signed)
 Anesthesia Post Note  Patient: Jorge [name]  Procedure(s) Performed: EXCISION OF RIGHT UPPER ARM VEIN GRAFT (Right: Arm Upper) VEIN PATCH OF BRACHIAL ARTERY (Right: Arm Upper)     Patient location during evaluation: PACU Anesthesia Type: General Level of consciousness: awake Pain management: pain level controlled Vital Signs Assessment: post-procedure vital signs reviewed and stable Respiratory status: spontaneous breathing, nonlabored ventilation and respiratory function stable Cardiovascular status: blood pressure returned to baseline and stable Postop Assessment: no apparent nausea or vomiting Anesthetic complications: no   No notable events documented.  Last Vitals:  Vitals:   09/13/24 0000 09/13/24 0300  BP: 133/86 131/86  Pulse:  94  Resp: 11 15  Temp:  36.9 C  SpO2:      Last Pain:  Vitals:   09/13/24 0300  TempSrc: Oral  PainSc:                  [name] P [name]

## 2024-09-14 ENCOUNTER — Encounter (HOSPITAL_COMMUNITY): Payer: Self-pay | Admitting: Vascular Surgery

## 2024-09-14 ENCOUNTER — Inpatient Hospital Stay (HOSPITAL_COMMUNITY)

## 2024-09-14 DIAGNOSIS — T827XXA Infection and inflammatory reaction due to other cardiac and vascular devices, implants and grafts, initial encounter: Secondary | ICD-10-CM

## 2024-09-14 NOTE — Progress Notes (Signed)
 Progress Note   Patient: Jorge [name] FMW:[id number] DOB: [birth year] DOA: 09/11/2024  DOS: the patient was seen and examined on 09/14/2024   Brief hospital course:  43 y.o. African-American male with medical history significant for asthma, GERD, essential hypertension, and SLE, who presented to the emergency room with acute onset of right arm swelling and pain at the site of his AV fistula with associated warmth.   Assessment and Plan:  Concern for infected right arm AV graft with abscess -S/p drainage of abscess by vascular surgery 12/5.  Initial cultures negative however wound culture being reintubated due to possible monomicrobial gram positive cocci.  Continue on empiric antibiotics for now.  Wound care per vascular surgery.  Can follow-up with vascular surgery in 2-3 weeks for incision check.  ESRD on HD - Continue HD MWF.  Nephrology following closely.  Hypertensive urgency - Appears to be resolved at this time.  Continue amlodipine , labetalol .  Hyperkalemia - Potassium 6.7 on presentation.  Improved after HD.  Will DC on Lokelma  10 g daily.   Subjective: Patient feeling well this morning.  Denies any fever, chills, chest pain, vomiting, abdominal pain.  No drainage from his wound.  Physical Exam:  Vitals:   09/14/24 0000 09/14/24 0420 09/14/24 0718 09/14/24 1044  BP: 132/79 129/74 127/77 127/88  Pulse: 96 81 86 92  Resp: 13 11 16 13   Temp: 98.2 F (36.8 C) 97.8 F (36.6 C) 97.8 F (36.6 C) 97.8 F (36.6 C)  TempSrc: Oral Oral Oral Oral  SpO2: 93% 95% 93% 91%  Weight:      Height:        GENERAL:  Alert, pleasant, no acute distress  HEENT:  EOMI CARDIOVASCULAR:  RRR, no murmurs appreciated RESPIRATORY:  Clear to auscultation, no wheezing, rales, or rhonchi GASTROINTESTINAL:  Soft, nontender, nondistended EXTREMITIES:  No LE edema bilaterally NEURO:  No new focal deficits appreciated SKIN: Well-healing right AV fistula incision PSYCH:  Appropriate mood and  affect     Data Reviewed:  Imaging Studies: UE VENOUS DUPLEX (7am - 7pm) Result Date: 09/14/2024 UPPER VENOUS STUDY  Patient Name:  Jorge [name]  Date of Exam:   09/14/2024 Medical Rec #: [id number]        Accession #:    [id number] Date of Birth: December 06, 1980        Patient Gender: M Patient Age:   89 years Exam Location:  [facility] Procedure:      VAS US  UPPER EXTREMITY VENOUS DUPLEX Referring Phys: [facility] --------------------------------------------------------------------------------  Indications: Swelling, and Pain Other Indications: Status post - EXCISION OF RIGHT UPPER ARM VEIN GRAFT 09/12/24. Limitations: Poor ultrasound/tissue interface and right upper arm painful to touch. Performing Technologist: Ricka Sturdivant-Jones RDMS, RVT  Examination Guidelines: A complete evaluation includes B-mode imaging, spectral Doppler, color Doppler, and power Doppler as needed of all accessible portions of each vessel. Bilateral testing is considered an integral part of a complete examination. Limited examinations for reoccurring indications may be performed as noted.  Right Findings: +----------+------------+---------+-----------+----------+--------------+ RIGHT     CompressiblePhasicitySpontaneousProperties   Summary     +----------+------------+---------+-----------+----------+--------------+ IJV           Full       Yes       Yes                             +----------+------------+---------+-----------+----------+--------------+ Subclavian  Yes       No               dampened flow  +----------+------------+---------+-----------+----------+--------------+ Axillary                 Yes       Yes                             +----------+------------+---------+-----------+----------+--------------+ Brachial                 Yes       Yes                             +----------+------------+---------+-----------+----------+--------------+ Radial         Full                                                 +----------+------------+---------+-----------+----------+--------------+ Ulnar         Full                                                 +----------+------------+---------+-----------+----------+--------------+ Cephalic      Full                                                 +----------+------------+---------+-----------+----------+--------------+ Basilic                                             Not visualized +----------+------------+---------+-----------+----------+--------------+ Right upper arm patent with color. Limted evaluation, too painful to compress veins. Right subclavian vein is patent with dampened continuous flow which may suggest a more proximal obstruction.  Left Findings: +----------+------------+---------+-----------+----------+-------+ LEFT      CompressiblePhasicitySpontaneousPropertiesSummary +----------+------------+---------+-----------+----------+-------+ Subclavian               Yes       Yes                      +----------+------------+---------+-----------+----------+-------+  Summary:  Right: The right upper extremity appears patent with color doppler with no evidence of deep vein thrombosis in the veins that were clearly visualized.  Left: No evidence of thrombosis in the subclavian.  *See table(s) above for measurements and observations.     Preliminary    CT HUMERUS RIGHT W CONTRAST Result Date: 09/11/2024 CLINICAL DATA:  Soft tissue mass of upper arm. Recent graft placement. EXAM: CT OF THE UPPER RIGHT EXTREMITY WITH CONTRAST TECHNIQUE: Multidetector CT imaging of the upper right extremity was performed according to the standard protocol following intravenous contrast administration. RADIATION DOSE REDUCTION: This exam was performed according to the departmental dose-optimization program which includes automated exposure control, adjustment of the mA and/or kV according to patient  size and/or use of iterative reconstruction technique. CONTRAST:  75mL OMNIPAQUE  IOHEXOL  350 MG/ML SOLN COMPARISON:  None Available. FINDINGS: There is no acute fracture or focal osseous lesion.  There is no significant elbow or shoulder joint effusion. A graft is present connecting the left axillary vein to a vessel at the level of the distal humerus. Graft is grossly patent, but evaluation is limited secondary to timing of the contrast bolus. There is mild subcutaneous stranding surrounding the entire graft. Adjacent to the distal 2 cm of the graft there is an enhancing fluid collection measuring 2.7 x 2.2 by 2.8 cm. This abuts the graft. There is no contrast opacification within this collection. There is surrounding inflammatory stranding. Multiple superficial collateral vessels are seen along the right anterior chest wall and within the proximal right arm. There is a right upper lobe nodule measuring 5 mm on image 4/50. IMPRESSION: 1. 2.8 cm enhancing fluid collection adjacent to the distal 2 cm of the graft in the right upper extremity. This is concerning for abscess. 2. Mild subcutaneous stranding surrounding the entire graft. 3. 5 mm right upper lobe pulmonary nodule. No follow-up needed if patient is low-risk.This recommendation follows the consensus statement: Guidelines for Management of Incidental Pulmonary Nodules Detected on CT Images: From the Fleischner Society 2017; Radiology 2017; 284:228-243. Electronically Signed   By: Greig Pique M.D.   On: 09/11/2024 23:11    There are no new results to review at this time.  Previous records (including but not limited to H&P, progress notes, nursing notes, TOC management) were reviewed in assessment of this patient.  Labs: CBC: Recent Labs  Lab 09/11/24 1939 09/12/24 0027 09/12/24 0455 09/12/24 1340 09/13/24 0250  WBC 6.3 7.0 5.4  --  4.6  NEUTROABS 4.0  --   --   --   --   HGB 13.4 12.1* 10.9* 13.6 9.7*  HCT 43.4 40.3 36.1* 40.0 30.2*  MCV  88.2 90.4 90.3  --  86.5  PLT 197 149* 176  --  158   Basic Metabolic Panel: Recent Labs  Lab 09/11/24 1939 09/12/24 0028 09/12/24 0455 09/12/24 1340 09/13/24 0250 09/13/24 1411  NA 139 138 138 136 132*  --   K 6.7* 5.2* 4.8 4.5 5.6* 4.8  CL 103 102 101 96* 96*  --   CO2 22 21* 18*  --  23  --   GLUCOSE 82 101* 96 83 134*  --   BUN 52* 57* 59* 29* 35*  --   CREATININE 10.56* 10.49* 10.61* 6.50* 8.04*  --   CALCIUM 8.7* 7.9* 8.1*  --  8.6*  --   PHOS  --  6.1*  --   --  7.5*  --    Liver Function Tests: Recent Labs  Lab 09/11/24 1939 09/12/24 0028 09/13/24 0250  AST 16  --  13*  ALT 15  --  9  ALKPHOS 72  --  50  BILITOT 0.4  --  0.3  PROT 9.3*  --  7.0  ALBUMIN  4.0 3.1* 2.9*   CBG: No results for input(s): GLUCAP in the last 168 hours.  Scheduled Meds:  amLODipine   10 mg Oral q morning   Chlorhexidine  Gluconate Cloth  6 each Topical Q0600   heparin   5,000 Units Subcutaneous Q8H   hydroxychloroquine   200 mg Oral BH-q7a   labetalol   100 mg Oral BID   montelukast   10 mg Oral Daily   sevelamer  carbonate  1,600 mg Oral TID WC   Continuous Infusions:  ceFEPime  (MAXIPIME ) IV 1 g (09/14/24 0504)   linezolid  (ZYVOX ) IV 600 mg (09/14/24 0541)   PRN Meds:.acetaminophen  **OR** acetaminophen , albuterol , hydrALAZINE , labetalol , morphine  injection,  ondansetron  **OR** ondansetron  (ZOFRAN ) IV, traZODone   Family Communication: None at bedside  Disposition: Status is: Inpatient Remains inpatient appropriate because: ESRD     Time spent: 36 minutes  Length of inpatient stay: 2 days  Author: Carliss LELON Canales, DO 09/14/2024 1:02 PM  For on call review [email or web address].

## 2024-09-14 NOTE — Hospital Course (Signed)
 43 y.o. African-American male with medical history significant for asthma, GERD, essential hypertension, and SLE, who presented to the emergency room with acute onset of right arm swelling and pain at the site of his AV fistula with associated warmth.    Assessment and Plan:   Concern for infected right arm AV graft with abscess -S/p drainage of abscess by vascular surgery 12/5.  Initial cultures negative however wound culture being reintubated due to possible monomicrobial gram positive cocci.  Repeat culture noting Staph epidermidis.  Continue on empiric antibiotics for now.  Wound care per vascular surgery.  Can follow-up with vascular surgery in 2-3 weeks for incision check.     Staph epidermidis bacteremia - Wound culture and blood culture show staph epidermis.  Will consult infectious disease.  Likely repeat blood cultures to ensure clearance of bacteremia.  Low concern for endocarditis.   ESRD on HD - Continue HD MWF.  Tolerated HD 12/8, -2 L.  Nephrology following closely.   Hypertensive urgency - Appears to be resolved at this time.  Continue amlodipine , labetalol .   Hyperkalemia - Potassium 6.7 on presentation.  Improved after HD.  Will DC on Lokelma  10 g daily.   Goals of care - Patient was scheduled to be discharged today however unfortunately one of his blood cultures and wound cultures returned positive for staph.  Infectious disease consulting, repeat blood cultures, and delay safe discharge.

## 2024-09-14 NOTE — Progress Notes (Signed)
 Right upper ext venous  has been completed. Refer to [facility] under chart review to view preliminary results.   09/14/2024  9:56 AM Jorge [name], [name]

## 2024-09-14 NOTE — Progress Notes (Signed)
 [facility] Progress Note   Subjective:   Patient seen and examined at bedside.  No new issues. Says arm feels ok.     Objective Vitals:   09/13/24 1558 09/13/24 1940 09/14/24 0000 09/14/24 0420  BP: (!) 153/78 130/82 132/79 129/74  Pulse: 99 90 96 81  Resp: 15 16 13 11   Temp: 98.1 F (36.7 C) 98.1 F (36.7 C) 98.2 F (36.8 C) 97.8 F (36.6 C)  TempSrc: Oral Oral Oral Oral  SpO2:  94% 93% 95%  Weight:      Height:       Physical Exam General:alert, well appearing male in NAD Heart:RRR, no mrg Lungs:CTAB, nml WOB on RA Abdomen:soft , NTND  Extremities:no LE edema, Right arm dressed in ACE wrap Dialysis Access: [facility]   Filed Weights   09/11/24 1837 09/12/24 0804 09/12/24 1145  Weight: 65.8 kg 65.8 kg 65 kg    Intake/Output Summary (Last 24 hours) at 09/14/2024 0707 Last data filed at 09/14/2024 0541 Gross per 24 hour  Intake 1300 ml  Output --  Net 1300 ml    Additional Objective Labs: Basic Metabolic Panel: Recent Labs  Lab 09/12/24 0028 09/12/24 0455 09/12/24 1340 09/13/24 0250 09/13/24 1411  NA 138 138 136 132*  --   K 5.2* 4.8 4.5 5.6* 4.8  CL 102 101 96* 96*  --   CO2 21* 18*  --  23  --   GLUCOSE 101* 96 83 134*  --   BUN 57* 59* 29* 35*  --   CREATININE 10.49* 10.61* 6.50* 8.04*  --   CALCIUM 7.9* 8.1*  --  8.6*  --   PHOS 6.1*  --   --  7.5*  --    Liver Function Tests: Recent Labs  Lab 09/11/24 1939 09/12/24 0028 09/13/24 0250  AST 16  --  13*  ALT 15  --  9  ALKPHOS 72  --  50  BILITOT 0.4  --  0.3  PROT 9.3*  --  7.0  ALBUMIN  4.0 3.1* 2.9*    CBC: Recent Labs  Lab 09/11/24 1939 09/12/24 0027 09/12/24 0455 09/12/24 1340 09/13/24 0250  WBC 6.3 7.0 5.4  --  4.6  NEUTROABS 4.0  --   --   --   --   HGB 13.4 12.1* 10.9* 13.6 9.7*  HCT 43.4 40.3 36.1* 40.0 30.2*  MCV 88.2 90.4 90.3  --  86.5  PLT 197 149* 176  --  158   Blood Culture    Component Value Date/Time   SDES WOUND RIGHT ARM 09/12/2024 1541    SPECREQUEST SWABS 09/12/2024 1541   CULT  09/12/2024 1541    TOO YOUNG TO READ Performed at [facility], [address] [place], [place] [address]    REPTSTATUS PENDING 09/12/2024 1541    Studies/Results: No results found.   Medications:  ceFEPime  (MAXIPIME ) IV 1 g (09/14/24 0504)   linezolid  (ZYVOX ) IV 600 mg (09/14/24 0541)    amLODipine   10 mg Oral q morning   Chlorhexidine  Gluconate Cloth  6 each Topical Q0600   heparin   5,000 Units Subcutaneous Q8H   hydroxychloroquine   200 mg Oral BH-q7a   labetalol   100 mg Oral BID   montelukast   10 mg Oral Daily   sevelamer  carbonate  1,600 mg Oral TID WC    Dialysis Orders: [facility] - MWF  Assessment/Plan: 1. AVG infection and seroma - excision of RU AVG and seroma drainage, with vein patch angioplasty  yesterday by Dr. Gretta.  BC with gram positive cocci S epi in 1 of 2 likely contaminant.  Seroma shows no WBC/org on gram stain, culture NGTD.  On ABX but these can likely be stopped.  He was afebrile with no leukocytosis during this admission.    2. ESRD -on HD MWF.  HD yesterday tolerated well.  Next HD on 09/15/24.  3. Hyperkalemia - Says he's been informed of this issue outpt mutliple times.  2/2 dietary indiscretions.  Discussed importance of low K diet.  Lokelma  ordered.  F/u K 4.8 yesterday.  D/c on lokelma  10g daily.   4. Anemia of CKD- Hgb 9-10 range.  No indication for ESA at this time.   5. Secondary hyperparathyroidism - Ca in goal. Phos elevated. On renvela  2 AC TID, senispar 90mg  qHD.  6. HTN/volume - Blood pressure in goal. Continue home meds. Does not appear volume overloaded.  UF as tolerated.  7. Nutrition -  Renal diet w/fluid restrictions. Reviewed low K diet.   8. SLE - on plaquenil .   Ok for d/c from nephrology perspective.  If he does d/c today go to outpt HD tomorrow.    Manuelita Barters MD [facility] Pager [phone]

## 2024-09-14 NOTE — Progress Notes (Signed)
 [facility]   Subjective  -no complaints   Objective 127/77 86 97.8 F (36.6 C) (Oral) 16 93%  Intake/Output Summary (Last 24 hours) at 09/14/2024 0943 Last data filed at 09/14/2024 0541 Gross per 24 hour  Intake 1060 ml  Output --  Net 1060 ml    Right upper arm incisions without hematoma Right radial pulse palpable  Laboratory Lab Results: Recent Labs    09/12/24 0455 09/12/24 1340 09/13/24 0250  WBC 5.4  --  4.6  HGB 10.9* 13.6 9.7*  HCT 36.1* 40.0 30.2*  PLT 176  --  158   BMET Recent Labs    09/12/24 0455 09/12/24 1340 09/13/24 0250 09/13/24 1411  NA 138 136 132*  --   K 4.8 4.5 5.6* 4.8  CL 101 96* 96*  --   CO2 18*  --  23  --   GLUCOSE 96 83 134*  --   BUN 59* 29* 35*  --   CREATININE 10.61* 6.50* 8.04*  --   CALCIUM 8.1*  --  8.6*  --     COAG Lab Results  Component Value Date   INR 1.1 01/14/2024   No results found for: PTT  Assessment/Planning:  43 year old male postop day 2 status post excision of right arm AV graft with vein patch of the brachial artery.  Incisions look good.  Palpable radial pulse.  No growth on cultures and so far appears to be a seroma.  Okay for discharge from our standpoint and we will arrange follow-up in 2 to 3 weeks for incision check as discussed with patient.  Lonni JINNY Gaskins 09/14/2024 9:43 AM --

## 2024-09-14 NOTE — Plan of Care (Signed)
  Problem: Clinical Measurements: Goal: Ability to avoid or minimize complications of infection will improve Outcome: Progressing   Problem: Skin Integrity: Goal: Skin integrity will improve Outcome: Progressing   Problem: Education: Goal: Knowledge of General Education information will improve Description: Including pain rating scale, medication(s)/side effects and non-pharmacologic comfort measures Outcome: Progressing   Problem: Health Behavior/Discharge Planning: Goal: Ability to manage health-related needs will improve Outcome: Progressing   Problem: Clinical Measurements: Goal: Ability to maintain clinical measurements within normal limits will improve Outcome: Progressing Goal: Will remain free from infection Outcome: Progressing Goal: Diagnostic test results will improve Outcome: Progressing Goal: Respiratory complications will improve Outcome: Progressing Goal: Cardiovascular complication will be avoided Outcome: Progressing   Problem: Activity: Goal: Risk for activity intolerance will decrease Outcome: Progressing   Problem: Nutrition: Goal: Adequate nutrition will be maintained Outcome: Progressing   Problem: Coping: Goal: Level of anxiety will decrease Outcome: Progressing   Problem: Elimination: Goal: Will not experience complications related to bowel motility Outcome: Progressing Goal: Will not experience complications related to urinary retention Outcome: Progressing   Problem: Pain Managment: Goal: General experience of comfort will improve and/or be controlled Outcome: Progressing   Problem: Safety: Goal: Ability to remain free from injury will improve Outcome: Progressing   Problem: Skin Integrity: Goal: Risk for impaired skin integrity will decrease Outcome: Progressing

## 2024-09-15 MED ORDER — HEPARIN SODIUM (PORCINE) 1000 UNIT/ML IJ SOLN
3200.0000 [IU] | Freq: Once | INTRAMUSCULAR | Status: AC
  Administered 2024-09-15: 3200 [IU]

## 2024-09-15 MED ORDER — LINEZOLID 600 MG PO TABS
600.0000 mg | ORAL_TABLET | Freq: Two times a day (BID) | ORAL | Status: AC
  Administered 2024-09-16 – 2024-09-17 (×3): 600 mg via ORAL
  Filled 2024-09-15 (×4): qty 1

## 2024-09-15 MED ORDER — SODIUM CHLORIDE 0.9 % IV SOLN: 2.0000 g | INTRAVENOUS | Status: DC

## 2024-09-15 MED ORDER — SODIUM CHLORIDE 0.9 % IV SOLN
1.0000 g | Freq: Once | INTRAVENOUS | Status: AC
  Administered 2024-09-15: 1 g via INTRAVENOUS
  Filled 2024-09-15: qty 10

## 2024-09-15 MED ORDER — HEPARIN SODIUM (PORCINE) 1000 UNIT/ML IJ SOLN
INTRAMUSCULAR | Status: AC
  Filled 2024-09-15: qty 4

## 2024-09-15 NOTE — Progress Notes (Addendum)
 Received patient in bed to unit.  Alert and oriented.  Informed consent signed and in chart.   TX duration:3 hours  Patient tolerated well.  Transported back to the room  Alert, without acute distress.  Hand-off given to patient's nurse.   Access used: R internal jugular HD cath Access issues: none  Total UF removed: 2L   Camellia Brasil LPN [facility]

## 2024-09-15 NOTE — Progress Notes (Signed)
   09/15/24 1145  Vitals  Temp 97.8 F (36.6 C)  BP 109/72  Pulse Rate 88  Resp 14  Oxygen Therapy  SpO2 100 %  O2 Device Room Air  During Treatment Monitoring  Blood Flow Rate (mL/min) 400 mL/min  Arterial Pressure (mmHg) -17.57 mmHg  Venous Pressure (mmHg) 186.45 mmHg  TMP (mmHg) 16.16 mmHg  Ultrafiltration Rate (mL/min) 802 mL/min  Dialysate Flow Rate (mL/min) 300 ml/min  Duration of HD Treatment -hour(s) 2.97 hour(s)  Cumulative Fluid Removed (mL) per Treatment  1979.52  HD Safety Checks Performed Yes  Intra-Hemodialysis Comments Tolerated well;Tx completed  Post Treatment  Dialyzer Clearance Lightly streaked  Liters Processed 72  Fluid Removed (mL) 2000 mL  Tolerated HD Treatment Yes  Hemodialysis Catheter Right Internal jugular Double lumen Permanent (Tunneled)  Placement Date/Time: (c) 09/12/24 1202   Placed prior to admission: Yes  Time Out: Correct patient;Correct site;Correct procedure  Maximum sterile barrier precautions: Hand hygiene;Cap;Mask  Orientation: Right  Access Location: Internal jugular  Hemod...  Site Condition No complications  Blue Lumen Status Antimicrobial dead end cap;Heparin  locked;Flushed  Red Lumen Status Antimicrobial dead end cap;Heparin  locked;Flushed  Purple Lumen Status N/A  Catheter fill solution Heparin  1000 units/ml  Catheter fill volume (Arterial) 1.6 cc  Catheter fill volume (Venous) 1.6  Dressing Type Transparent  Dressing Status Clean, Dry, Intact  Drainage Description None  Dressing Change Due 09/22/24  Post treatment catheter status Capped and Clamped

## 2024-09-15 NOTE — Progress Notes (Signed)
 Progress Note   Patient: Beryl Balz FMW:[id number] DOB: October 17, 1980 DOA: 09/11/2024  DOS: the patient was seen and examined on 09/15/2024   Brief hospital course:  43 y.o. African-American male with medical history significant for asthma, GERD, essential hypertension, and SLE, who presented to the emergency room with acute onset of right arm swelling and pain at the site of his AV fistula with associated warmth.    Assessment and Plan:   Concern for infected right arm AV graft with abscess -S/p drainage of abscess by vascular surgery 12/5.  Initial cultures negative however wound culture being reintubated due to possible monomicrobial gram positive cocci.  Continue on empiric antibiotics for now.  Wound care per vascular surgery.  Can follow-up with vascular surgery in 2-3 weeks for incision check.  Will likely transition to IV cefepime  plus p.o. linezolid  upon discharge.   ESRD on HD - Continue HD MWF.  Tolerated HD today, -2 L.  Nephrology following closely.   Hypertensive urgency - Appears to be resolved at this time.  Continue amlodipine , labetalol .   Hyperkalemia - Potassium 6.7 on presentation.  Improved after HD.  Will DC on Lokelma  10 g daily.  Goals of care - Patient states he feels very weak and drained after dialysis.  Requesting discharge tomorrow.   Subjective: Patient evaluated prior to dialysis, feeling well.  Had 1 episode of nausea overnight.  Denies any fever, chest pain, shortness of breath, abdominal pain.  Right fistula incision healing well.  Physical Exam:  Vitals:   09/15/24 1100 09/15/24 1130 09/15/24 1145 09/15/24 1146  BP: 119/78 112/75 109/72 109/72  Pulse: 81 84 88 87  Resp: 13 15 14 11   Temp:   97.8 F (36.6 C) 97.8 F (36.6 C)  TempSrc:      SpO2: 98% 98% 100% 99%  Weight:    65.7 kg  Height:        GENERAL:  Alert, pleasant, no acute distress  HEENT:  EOMI CARDIOVASCULAR:  RRR, no murmurs appreciated RESPIRATORY:  Clear to  auscultation, no wheezing, rales, or rhonchi GASTROINTESTINAL:  Soft, nontender, nondistended EXTREMITIES:  No LE edema bilaterally NEURO:  No new focal deficits appreciated SKIN: Well-healing right AV fistula incision PSYCH:  Appropriate mood and affect    Data Reviewed:  Imaging Studies: UE VENOUS DUPLEX (7am - 7pm) Result Date: 09/14/2024 UPPER VENOUS STUDY  Patient Name:  YASSINE BRUNSMAN  Date of Exam:   09/14/2024 Medical Rec #: [id number]        Accession #:    [id number] Date of Birth: 1981/08/02        Patient Gender: M Patient Age:   66 years Exam Location:  [facility] [place] Procedure:      VAS US  UPPER EXTREMITY VENOUS DUPLEX Referring Phys: [facility] --------------------------------------------------------------------------------  Indications: Swelling, and Pain Other Indications: Status post - EXCISION OF RIGHT UPPER ARM VEIN GRAFT 09/12/24. Limitations: Poor ultrasound/tissue interface and right upper arm painful to touch. Performing Technologist: Ricka Sturdivant-Jones RDMS, RVT  Examination Guidelines: A complete evaluation includes B-mode imaging, spectral Doppler, color Doppler, and power Doppler as needed of all accessible portions of each vessel. Bilateral testing is considered an integral part of a complete examination. Limited examinations for reoccurring indications may be performed as noted.  Right Findings: +----------+------------+---------+-----------+----------+--------------+ RIGHT     CompressiblePhasicitySpontaneousProperties   Summary     +----------+------------+---------+-----------+----------+--------------+ IJV           Full       Yes  Yes                             +----------+------------+---------+-----------+----------+--------------+ Subclavian               Yes       No               dampened flow  +----------+------------+---------+-----------+----------+--------------+ Axillary                 Yes       Yes                              +----------+------------+---------+-----------+----------+--------------+ Brachial                 Yes       Yes                             +----------+------------+---------+-----------+----------+--------------+ Radial        Full                                                 +----------+------------+---------+-----------+----------+--------------+ Ulnar         Full                                                 +----------+------------+---------+-----------+----------+--------------+ Cephalic      Full                                                 +----------+------------+---------+-----------+----------+--------------+ Basilic                                             Not visualized +----------+------------+---------+-----------+----------+--------------+ Right upper arm patent with color. Limted evaluation, too painful to compress veins. Right subclavian vein is patent with dampened continuous flow which may suggest a more proximal obstruction.  Left Findings: +----------+------------+---------+-----------+----------+-------+ LEFT      CompressiblePhasicitySpontaneousPropertiesSummary +----------+------------+---------+-----------+----------+-------+ Subclavian               Yes       Yes                      +----------+------------+---------+-----------+----------+-------+  Summary:  Right: The right upper extremity appears patent with color doppler with no evidence of deep vein thrombosis in the veins that were clearly visualized.  Left: No evidence of thrombosis in the subclavian.  *See table(s) above for measurements and observations.  Diagnosing physician: Debby Robertson Electronically signed by Debby Robertson on 09/14/2024 at 8:39:11 PM.    Final    CT HUMERUS RIGHT W CONTRAST Result Date: 09/11/2024 CLINICAL DATA:  Soft tissue mass of upper arm. Recent graft placement. EXAM: CT OF THE UPPER RIGHT EXTREMITY WITH CONTRAST TECHNIQUE:  Multidetector CT imaging of the upper right extremity was performed according to the standard protocol following  intravenous contrast administration. RADIATION DOSE REDUCTION: This exam was performed according to the departmental dose-optimization program which includes automated exposure control, adjustment of the mA and/or kV according to patient size and/or use of iterative reconstruction technique. CONTRAST:  75mL OMNIPAQUE  IOHEXOL  350 MG/ML SOLN COMPARISON:  None Available. FINDINGS: There is no acute fracture or focal osseous lesion. There is no significant elbow or shoulder joint effusion. A graft is present connecting the left axillary vein to a vessel at the level of the distal humerus. Graft is grossly patent, but evaluation is limited secondary to timing of the contrast bolus. There is mild subcutaneous stranding surrounding the entire graft. Adjacent to the distal 2 cm of the graft there is an enhancing fluid collection measuring 2.7 x 2.2 by 2.8 cm. This abuts the graft. There is no contrast opacification within this collection. There is surrounding inflammatory stranding. Multiple superficial collateral vessels are seen along the right anterior chest wall and within the proximal right arm. There is a right upper lobe nodule measuring 5 mm on image 4/50. IMPRESSION: 1. 2.8 cm enhancing fluid collection adjacent to the distal 2 cm of the graft in the right upper extremity. This is concerning for abscess. 2. Mild subcutaneous stranding surrounding the entire graft. 3. 5 mm right upper lobe pulmonary nodule. No follow-up needed if patient is low-risk.This recommendation follows the consensus statement: Guidelines for Management of Incidental Pulmonary Nodules Detected on CT Images: From the Fleischner Society 2017; Radiology 2017; 284:228-243. Electronically Signed   By: Greig Pique M.D.   On: 09/11/2024 23:11    There are no new results to review at this time.  Previous records (including but not  limited to H&P, progress notes, nursing notes, TOC management) were reviewed in assessment of this patient.  Labs: CBC: Recent Labs  Lab 09/11/24 1939 09/12/24 0027 09/12/24 0455 09/12/24 1340 09/13/24 0250  WBC 6.3 7.0 5.4  --  4.6  NEUTROABS 4.0  --   --   --   --   HGB 13.4 12.1* 10.9* 13.6 9.7*  HCT 43.4 40.3 36.1* 40.0 30.2*  MCV 88.2 90.4 90.3  --  86.5  PLT 197 149* 176  --  158   Basic Metabolic Panel: Recent Labs  Lab 09/11/24 1939 09/12/24 0028 09/12/24 0455 09/12/24 1340 09/13/24 0250 09/13/24 1411  NA 139 138 138 136 132*  --   K 6.7* 5.2* 4.8 4.5 5.6* 4.8  CL 103 102 101 96* 96*  --   CO2 22 21* 18*  --  23  --   GLUCOSE 82 101* 96 83 134*  --   BUN 52* 57* 59* 29* 35*  --   CREATININE 10.56* 10.49* 10.61* 6.50* 8.04*  --   CALCIUM 8.7* 7.9* 8.1*  --  8.6*  --   PHOS  --  6.1*  --   --  7.5*  --    Liver Function Tests: Recent Labs  Lab 09/11/24 1939 09/12/24 0028 09/13/24 0250  AST 16  --  13*  ALT 15  --  9  ALKPHOS 72  --  50  BILITOT 0.4  --  0.3  PROT 9.3*  --  7.0  ALBUMIN  4.0 3.1* 2.9*   CBG: No results for input(s): GLUCAP in the last 168 hours.  Scheduled Meds:  amLODipine   10 mg Oral q morning   Chlorhexidine  Gluconate Cloth  6 each Topical Q0600   heparin   5,000 Units Subcutaneous Q8H   hydroxychloroquine   200 mg Oral  BH-q7a   labetalol   100 mg Oral BID   linezolid   600 mg Oral Q12H   montelukast   10 mg Oral Daily   sevelamer  carbonate  1,600 mg Oral TID WC   Continuous Infusions:  ceFEPime  (MAXIPIME ) IV     [START ON 09/17/2024] ceFEPime  (MAXIPIME ) IV     PRN Meds:.acetaminophen  **OR** acetaminophen , albuterol , hydrALAZINE , labetalol , morphine  injection, ondansetron  **OR** ondansetron  (ZOFRAN ) IV, traZODone   Family Communication: None at bedside  Disposition: Status is: Inpatient Remains inpatient appropriate because: ESRD     Time spent: 38 minutes  Length of inpatient stay: 3 days  Author: Carliss LELON Canales,  DO 09/15/2024 1:06 PM  For on call review [email or web address].

## 2024-09-15 NOTE — Plan of Care (Signed)
  Problem: Clinical Measurements: Goal: Ability to avoid or minimize complications of infection will improve Outcome: Progressing   Problem: Skin Integrity: Goal: Skin integrity will improve Outcome: Progressing   Problem: Education: Goal: Knowledge of General Education information will improve Description: Including pain rating scale, medication(s)/side effects and non-pharmacologic comfort measures Outcome: Progressing   Problem: Health Behavior/Discharge Planning: Goal: Ability to manage health-related needs will improve Outcome: Progressing   Problem: Clinical Measurements: Goal: Ability to maintain clinical measurements within normal limits will improve Outcome: Progressing Goal: Will remain free from infection Outcome: Progressing Goal: Diagnostic test results will improve Outcome: Progressing Goal: Respiratory complications will improve Outcome: Progressing Goal: Cardiovascular complication will be avoided Outcome: Progressing   Problem: Activity: Goal: Risk for activity intolerance will decrease Outcome: Progressing   Problem: Nutrition: Goal: Adequate nutrition will be maintained Outcome: Progressing   Problem: Coping: Goal: Level of anxiety will decrease Outcome: Progressing   Problem: Elimination: Goal: Will not experience complications related to bowel motility Outcome: Progressing Goal: Will not experience complications related to urinary retention Outcome: Progressing   Problem: Pain Managment: Goal: General experience of comfort will improve and/or be controlled Outcome: Progressing   Problem: Safety: Goal: Ability to remain free from injury will improve Outcome: Progressing   Problem: Skin Integrity: Goal: Risk for impaired skin integrity will decrease Outcome: Progressing

## 2024-09-15 NOTE — Progress Notes (Signed)
 Pharmacy Antibiotic Note  Jorge [name] is a 43 y.o. male with concern for infected right arm AV graft with abscess   He is on cefepime  and Zyvox  and plans are for discharge today. He is noted with ESRD on HD MWF  -last HD this morning  Plan: -Cefepime  1gm IV now (since he has cefepime  prior to HD today) -Change zyvoox to po  Discharge Plans -Cefepime  2mg  IV MWF at HD  -Change zyvox  to 600mg  po bid -Last dose of zyvox  09/21/2024 in the evening -Last dose of Cefepime  09/19/2024 at HD   Height: 5' 8 (172.7 cm) Weight: 65.7 kg (144 lb 13.5 oz) IBW/kg (Calculated) : 68.4  Temp (24hrs), Avg:98.1 F (36.7 C), Min:97.8 F (36.6 C), Max:98.8 F (37.1 C)  Recent Labs  Lab 09/11/24 1939 09/11/24 1943 09/12/24 0027 09/12/24 0028 09/12/24 0322 09/12/24 0455 09/12/24 1340 09/13/24 0250  WBC 6.3  --  7.0  --   --  5.4  --  4.6  CREATININE 10.56*  --   --  10.49*  --  10.61* 6.50* 8.04*  LATICACIDVEN  --  1.4  --   --  2.7* 2.0*  --   --     Estimated Creatinine Clearance: 11.2 mL/min (A) (by C-G formula based on SCr of 8.04 mg/dL (H)).    Allergies  Allergen Reactions   Vancomycin Hives   Esomeprazole Rash    Antimicrobials this admission: CRO 12/4 x1  Cefaz 12/5 x1  CFP 12/5>> LZD 12/5>>  Dose adjustments this admission:   Microbiology results: 12/4 Bcx: 2/4 MRSE  12/5 wound- ngtd  Thank you for allowing pharmacy to be a part of this patient's care.  Prentice Poisson, PharmD Clinical Pharmacist **Pharmacist phone directory can now be found on [email or web address] ([email or web address]).  Listed under [facility].

## 2024-09-15 NOTE — Progress Notes (Addendum)
  [facility] Progress Note   Subjective:   Patient seen and examined at HD unit. No c/o's today.   Objective Vitals:   09/15/24 1146 09/15/24 1239 09/15/24 1240 09/15/24 1540  BP: 109/72 (!) 134/102  114/78  Pulse: 87 87  89  Resp: 11 18  20   Temp: 97.8 F (36.6 C) 97.7 F (36.5 C)  97.8 F (36.6 C)  TempSrc:  Oral  Oral  SpO2: 99% 97% 97% 98%  Weight: 65.7 kg     Height:       Physical Exam General:alert, well appearing male in NAD Heart:RRR, no mrg Lungs:CTAB, nml WOB on RA Abdomen:soft , NTND  Extremities:no LE edema, Right arm dressed in ACE wrap Dialysis Access: TDC    OP HD: [facility] - MWF  Assessment/Plan: 1. AVG infection and seroma - excision of RU AVG and seroma drainage, with vein patch angioplasty yesterday by Dr. Gretta.  BC with gram positive cocci/ Staph epi in 1 of 2, possibly contaminant.  Seroma shows no WBC/org on gram stain, culture NGTD.  He was afebrile with no leukocytosis during this admission. DC abx are looking to be possibly po zyvox  and IV cefepime  1 gm w/ HD three times per week (last dose 09/19/24).    2. ESRD -on HD MWF.  HD yesterday tolerated well.  Next HD on 09/15/24.  3. Hyperkalemia - Says he's been informed of this issue outpt mutliple times.  2/2 dietary indiscretions.  Discussed importance of low K diet.  Lokelma  dc'd.  K+ 4.8- 5.6 today.  4. Anemia of CKD- Hgb 9-10 range.  No indication for ESA at this time.   5. Secondary hyperparathyroidism - Ca in goal. Phos elevated. On renvela  2 AC TID, senispar 90mg  qHD.  6. HTN/volume - Blood pressure in goal. Continue home meds. Does not appear volume overloaded.  Not tolerating UF w/ HD today. Gave 1 L NS due to ostomy output issues.  7. Nutrition -  Renal diet w/fluid restrictions. Reviewed low K diet.   8. SLE - on plaquenil .   Ok for d/c from nephrology perspective.   Myer Fret  MD  [facility] 09/15/2024, 7:23 PM  Recent Labs  Lab 09/12/24 0028 09/12/24 0455 09/12/24 1340  09/13/24 0250 09/13/24 1411  HGB  --  10.9* 13.6 9.7*  --   ALBUMIN  3.1*  --   --  2.9*  --   CALCIUM 7.9* 8.1*  --  8.6*  --   PHOS 6.1*  --   --  7.5*  --   CREATININE 10.49* 10.61* 6.50* 8.04*  --   K 5.2* 4.8 4.5 5.6* 4.8    Inpatient medications:  amLODipine   10 mg Oral q morning   Chlorhexidine  Gluconate Cloth  6 each Topical Q0600   heparin   5,000 Units Subcutaneous Q8H   hydroxychloroquine   200 mg Oral BH-q7a   labetalol   100 mg Oral BID   linezolid   600 mg Oral Q12H   montelukast   10 mg Oral Daily   sevelamer  carbonate  1,600 mg Oral TID WC    [START ON 09/17/2024] ceFEPime  (MAXIPIME ) IV     acetaminophen  **OR** acetaminophen , albuterol , hydrALAZINE , labetalol , morphine  injection, ondansetron  **OR** ondansetron  (ZOFRAN ) IV, traZODone

## 2024-09-16 ENCOUNTER — Inpatient Hospital Stay (HOSPITAL_COMMUNITY)

## 2024-09-16 LAB — ECHOCARDIOGRAM COMPLETE
AR max vel: 1.83 cm2
AV Area VTI: 1.7 cm2
AV Area mean vel: 1.73 cm2
AV Mean grad: 6 mmHg
AV Peak grad: 11.8 mmHg
Ao pk vel: 1.72 m/s
Area-P 1/2: 4.57 cm2
Height: 68 in
S' Lateral: 2.6 cm
Weight: 2317.48 [oz_av]

## 2024-09-16 MED ORDER — PERFLUTREN LIPID MICROSPHERE
1.0000 mL | INTRAVENOUS | Status: AC | PRN
  Administered 2024-09-16: 3 mL via INTRAVENOUS

## 2024-09-16 MED ORDER — DAPTOMYCIN-SODIUM CHLORIDE 500-0.9 MG/50ML-% IV SOLN
500.0000 mg | INTRAVENOUS | Status: DC
  Administered 2024-09-17 – 2024-09-24 (×3): 500 mg via INTRAVENOUS
  Filled 2024-09-16 (×5): qty 50

## 2024-09-16 MED ORDER — CHLORHEXIDINE GLUCONATE CLOTH 2 % EX PADS
6.0000 | MEDICATED_PAD | Freq: Every day | CUTANEOUS | Status: DC
  Administered 2024-09-17: 6 via TOPICAL

## 2024-09-16 NOTE — Care Management Important Message (Signed)
 Important Message  Patient Details  Name: Jorge [name] MRN: [id number] Date of Birth: 1980-12-17   Important Message Given:  Yes - Medicare IM     Vonzell Arrie Sharps 09/16/2024, 11:38 AM

## 2024-09-16 NOTE — Progress Notes (Signed)
  [facility] Progress Note   Subjective: No c/o's today  Objective Vitals:   09/16/24 0300 09/16/24 0720 09/16/24 1105 09/16/24 1530  BP: 113/77 124/83 114/79   Pulse: 90 80 88 81  Resp: 15 12 12    Temp: 98.4 F (36.9 C) 98.1 F (36.7 C) 98 F (36.7 C) 98 F (36.7 C)  TempSrc: Oral Oral Oral Oral  SpO2: 98% 97% 98% 99%  Weight:      Height:       Physical Exam General:alert, well appearing male in NAD Heart:RRR, no mrg Lungs:CTAB, nml WOB on RA Abdomen:soft , NTND  Extremities:no LE edema, Right arm dressed in ACE wrap Dialysis Access: TDC    OP HD: [facility] - MWF TDC / AVG now removed  Assessment/Plan: 1. AVG infection and seroma - excision of RU AVG and seroma drainage, with vein patch angioplasty by Dr. Gretta.  MRSE bacteremia 2/2 + cultures. Seen by ID, plan is for repeat blood cx's to make sure they are negative. IV abx changed to daptomycin , TTE ordered. Will check w/ pt's HD unit tomorrow as to whether they have daptomycin  available.  2. ESRD -on HD MWF.  HD tomorrow.  3. Hyperkalemia - get labs in the am 4. Anemia of CKD- Hgb 9-10 range.  No indication for ESA at this time.   5. HTN/volume - Blood pressure in goal. Continue home meds. Does not appear volume overloaded.  6. Nutrition -  Renal diet w/fluid restrictions. Reviewed low K diet.   7. SLE - on plaquenil .    Myer Fret  MD  [facility] 09/16/2024, 4:33 PM  Recent Labs  Lab 09/12/24 0028 09/12/24 0455 09/12/24 1340 09/13/24 0250 09/13/24 1411  HGB  --  10.9* 13.6 9.7*  --   ALBUMIN  3.1*  --   --  2.9*  --   CALCIUM 7.9* 8.1*  --  8.6*  --   PHOS 6.1*  --   --  7.5*  --   CREATININE 10.49* 10.61* 6.50* 8.04*  --   K 5.2* 4.8 4.5 5.6* 4.8    Inpatient medications:  amLODipine   10 mg Oral q morning   Chlorhexidine  Gluconate Cloth  6 each Topical Q0600   heparin   5,000 Units Subcutaneous Q8H   hydroxychloroquine   200 mg Oral BH-q7a   labetalol   100 mg Oral BID   linezolid   600 mg  Oral Q12H   montelukast   10 mg Oral Daily   sevelamer  carbonate  1,600 mg Oral TID WC    [START ON 09/17/2024] DAPTOmycin      acetaminophen  **OR** acetaminophen , albuterol , hydrALAZINE , labetalol , morphine  injection, ondansetron  **OR** ondansetron  (ZOFRAN ) IV, perflutren  lipid microspheres (DEFINITY ) IV suspension, traZODone

## 2024-09-16 NOTE — Plan of Care (Signed)
  Problem: Clinical Measurements: Goal: Ability to avoid or minimize complications of infection will improve Outcome: Progressing   Problem: Skin Integrity: Goal: Skin integrity will improve Outcome: Progressing   Problem: Education: Goal: Knowledge of General Education information will improve Description: Including pain rating scale, medication(s)/side effects and non-pharmacologic comfort measures Outcome: Progressing   Problem: Health Behavior/Discharge Planning: Goal: Ability to manage health-related needs will improve Outcome: Progressing   Problem: Clinical Measurements: Goal: Ability to maintain clinical measurements within normal limits will improve Outcome: Progressing Goal: Will remain free from infection Outcome: Progressing Goal: Diagnostic test results will improve Outcome: Progressing Goal: Respiratory complications will improve Outcome: Progressing Goal: Cardiovascular complication will be avoided Outcome: Progressing   Problem: Activity: Goal: Risk for activity intolerance will decrease Outcome: Progressing   Problem: Nutrition: Goal: Adequate nutrition will be maintained Outcome: Progressing   Problem: Coping: Goal: Level of anxiety will decrease Outcome: Progressing   Problem: Elimination: Goal: Will not experience complications related to bowel motility Outcome: Progressing Goal: Will not experience complications related to urinary retention Outcome: Progressing   Problem: Pain Managment: Goal: General experience of comfort will improve and/or be controlled Outcome: Progressing   Problem: Safety: Goal: Ability to remain free from injury will improve Outcome: Progressing   Problem: Skin Integrity: Goal: Risk for impaired skin integrity will decrease Outcome: Progressing

## 2024-09-16 NOTE — Progress Notes (Signed)
 Pharmacy Antibiotic Note  Jorge [name] is a 43 y.o. male admitted on 09/11/2024 with MRSE bacteremia + AVG infection.  Pharmacy has been consulted for Daptomycin  dosing.  The patient is ESRD-MWF with plans to transition to Daptomycin  for outpatient dosing with HD. Will bridge with linezolid  to 12/10 AM then start post-HD Daptomycin  dosing after the patient's HD session on Wed, 12/10. Will hold the dose at 8 mg/kg with each HD session for now pending additional work-up.  Plan: - Continue linezolid  600 mg po bid thru 12/10 AM dose - Start Daptomycin  500 mg after HD MWF @ 1800 - Will follow-up on TTE, repeat blood cultures, HD schedule, and plans for LOT  Height: 5' 8 (172.7 cm) Weight: 65.7 kg (144 lb 13.5 oz) IBW/kg (Calculated) : 68.4  Temp (24hrs), Avg:98.1 F (36.7 C), Min:97.8 F (36.6 C), Max:98.4 F (36.9 C)  Recent Labs  Lab 09/11/24 1939 09/11/24 1943 09/12/24 0027 09/12/24 0028 09/12/24 0322 09/12/24 0455 09/12/24 1340 09/13/24 0250  WBC 6.3  --  7.0  --   --  5.4  --  4.6  CREATININE 10.56*  --   --  10.49*  --  10.61* 6.50* 8.04*  LATICACIDVEN  --  1.4  --   --  2.7* 2.0*  --   --     Estimated Creatinine Clearance: 11.2 mL/min (A) (by C-G formula based on SCr of 8.04 mg/dL (H)).    Allergies  Allergen Reactions   Vancomycin Hives   Esomeprazole Rash    Antimicrobials this admission: Ceftriaxone  12/4 x 1 dose Linezolid  12/5 >> (12/10) Cefepime  12/5 >> 12/8 Daptomycin  12/10 >>  Dose adjustments this admission:   Microbiology results: 12/4 BCx >> 2/2 MRSE 12/5 WCx >> MRSE  Thank you for allowing pharmacy to be a part of this patient's care.  Almarie Lunger, PharmD, BCPS, BCIDP Infectious Diseases Clinical Pharmacist 09/16/2024 1:45 PM   **Pharmacist phone directory can now be found on [email or web address] ([email or web address]).  Listed under [facility].

## 2024-09-16 NOTE — Progress Notes (Signed)
 Contacted by pharmacy regarding Daptomycin  500 mg/HD-MWF for about 6 weeks. Called out-pt HD clinic, [facility], and provided this info. They will need to order this, and will request/ put in order today. Call back number given, they will call back once it arrives at facility and ready for use.   Jorge [name] Dialysis Navigator [phone]

## 2024-09-16 NOTE — Consult Note (Signed)
 [facility]                                                                                       Patient Identification: Patient Name: Jorge [name] MRN: [id number] Admit Date: 09/11/2024  6:34 PM Today's Date: 09/16/2024 Reason for consult: MRSE bacteremia, infected AV graft Requesting provider: Dr. Arlon  Principal Problem:   Hyperkalemia Active Problems:   Hypertensive urgency   ESRD on hemodialysis [facility])   Right arm cellulitis   Arteriovenous graft infection   Antibiotics:  Linezolid  12/4-12/7, 12/9- Cefepime  12/4-12/8 Ceftriaxone  12/4  Lines/Hardware: Right IJ tunneled catheter  Assessment # MRSE Bacteremia likely 2/2  # Infected AVG  12/5 s/p excision of right upper arm AV graft with drainage of seroma with vein patch angioplasty of right brachial artery with brachial vein.  OR culture with MRSE, matching sensitivity with MRSE in  blood  Recommendations  - Will DC cefepime , start daptomycin .  Will need to coordinate with HD center so that he can receive daptomycin  with HD - 2 sets of repeat blood cultures ordered for clearance. - TTE ordered - Ok to keep HD catheter as long as repeat blood cultures stay negative and TTE unremarkable for vegetations - Monitor CBC, BMP and CPK - Will reach out to vascular Dr. Gretta to make sure AV graft was completely excised and nothing retained to guide management of antibiotics - Universal/standard isolation precautions D/w primary   Rest of the management as per the primary team. Please call with questions or concerns.  Thank you for the consult  __________________________________________________________________________________________________________ HPI and Hospital Course: 43 year old male with prior history as below including asthma, HTN, GERD, SLE on hydroxychloroquine  who presented to the ED on 12/4 acute onset right arm  swelling, warmth and pain at AV fistula site.  No reported fevers or chills or nausea, vomiting or abdominal pain.  No reported cough, chest pain or shortness of breath or GU symptoms. he gets HD through his right chest port.  AVG has not been used due to steal syndrome.  At ED, hypertensive, afebrile Labs remarkable for K6.7 BUN 52, creatinine 10.56, albumin  4, lactic acid 1.4, WBC 6.3  Hepatitis B surface antigen negative, HIV nonreactive  Was given IV Zofran , IV Dilaudid , IV ceftriaxone ,  P.o. Lokelma , D50/insulin  and nebulized albuterol  for hyperkalemia.  Nephrology consulted from ED.   CT Rt humerus  IMPRESSION: 1. 2.8 cm enhancing fluid collection adjacent to the distal 2 cm of the graft in the right upper extremity. This is concerning for abscess. 2. Mild subcutaneous stranding surrounding the entire graft. 3. 5 mm right upper lobe pulmonary nodule. No follow-up needed if patient is low-risk.This recommendation follows the consensus statement: Guidelines for Management of Incidental Pulmonary Nodules Detected on CT Images: From the Fleischner Society 2017; Radiology 2017; 284:228-243.  ROS: General- Denies fever, chills, loss of appetite and loss of weight HEENT - Denies headache, blurry vision, neck pain, sinus pain Chest - Denies any chest pain, SOB or cough CVS- Denies any dizziness/lightheadedness, syncopal attacks, palpitations Abdomen- Denies any nausea, vomiting, abdominal pain, hematochezia and diarrhea Neuro - Denies any weakness, numbness, tingling  sensation Psych - Denies any changes in mood irritability or depressive symptoms GU- Denies any burning, dysuria, hematuria or increased frequency of urination Skin - denies any rashes/lesions MSK - denies any joint pain/swelling or restricted ROM   Past Medical History:  Diagnosis Date   Asthma    daily inhaler/neb.   Bronchopulmonary dysplasia (HCC)    GERD (gastroesophageal reflux disease)    no current med.    Hypertension    states BP runs high, has been on meds. x 5-6 yrs.   Lupus    kidneys, lungs, heart 01-18-16 no recent problems-no steroid use recently   Renal disorder    Subcutaneous nodules 09/2017   bilat. thigh, left forearm, left axilla   Past Surgical History:  Procedure Laterality Date   49 HOUR PH STUDY N/A 03/20/2016   Procedure: 24 HOUR PH STUDY;  Surgeon: Belvie Just, MD;  Location: [facility];  Service: Endoscopy;  Laterality: N/A;   AVGG REMOVAL Right 09/12/2024   Procedure: EXCISION OF RIGHT UPPER ARM VEIN GRAFT;  Surgeon: Gretta Lonni PARAS, MD;  Location: [facility] OR;  Service: Vascular;  Laterality: Right;   DIALYSIS/PERMA CATHETER INSERTION     ESOPHAGEAL MANOMETRY N/A 03/20/2016   Procedure: ESOPHAGEAL MANOMETRY (EM);  Surgeon: Belvie Just, MD;  Location: [facility];  Service: Endoscopy;  Laterality: N/A;   ESOPHAGOGASTRODUODENOSCOPY (EGD) WITH ESOPHAGEAL DILATION  08/19/2012   ESOPHAGOGASTRODUODENOSCOPY (EGD) WITH PROPOFOL  N/A 05/29/2014   Procedure: ESOPHAGOGASTRODUODENOSCOPY (EGD) WITH PROPOFOL ;  Surgeon: Belvie JONETTA Just, MD;  Location: [facility];  Service: Endoscopy;  Laterality: N/A;   ESOPHAGOGASTRODUODENOSCOPY (EGD) WITH PROPOFOL  N/A 07/24/2014   Procedure: ESOPHAGOGASTRODUODENOSCOPY (EGD) WITH PROPOFOL ;  Surgeon: Belvie JONETTA Just, MD;  Location: [facility];  Service: Endoscopy;  Laterality: N/A;   ESOPHAGOGASTRODUODENOSCOPY (EGD) WITH PROPOFOL  N/A 01/28/2016   Procedure: ESOPHAGOGASTRODUODENOSCOPY (EGD) WITH PROPOFOL ;  Surgeon: Belvie Just, MD;  Location: [facility];  Service: Endoscopy;  Laterality: N/A;   MASS EXCISION N/A 10/16/2017   Procedure: EXCISION MASS LEFT THIGH, RIGHT THIGH, LEFT FOREARM, LEFT AXILLA;  Surgeon: Vanderbilt Ned, MD;  Location: [facility];  Service: General;  Laterality: N/A;   PATCH ANGIOPLASTY Right 09/12/2024   Procedure: VEIN PATCH OF BRACHIAL ARTERY;  Surgeon: Gretta Lonni PARAS, MD;  Location: [facility] OR;   Service: Vascular;  Laterality: Right;   PH IMPEDANCE STUDY N/A 03/20/2016   Procedure: PH IMPEDANCE STUDY;  Surgeon: Belvie Just, MD;  Location: [facility];  Service: Endoscopy;  Laterality: N/A;   TONSILLECTOMY     WISDOM TOOTH EXTRACTION      Scheduled Meds:  amLODipine   10 mg Oral q morning   Chlorhexidine  Gluconate Cloth  6 each Topical Q0600   heparin   5,000 Units Subcutaneous Q8H   hydroxychloroquine   200 mg Oral BH-q7a   labetalol   100 mg Oral BID   linezolid   600 mg Oral Q12H   montelukast   10 mg Oral Daily   sevelamer  carbonate  1,600 mg Oral TID WC   Continuous Infusions:  [START ON 09/17/2024] ceFEPime  (MAXIPIME ) IV     PRN Meds:.acetaminophen  **OR** acetaminophen , albuterol , hydrALAZINE , labetalol , morphine  injection, ondansetron  **OR** ondansetron  (ZOFRAN ) IV, traZODone   Allergies  Allergen Reactions   Vancomycin Hives   Esomeprazole Rash   Social History   Socioeconomic History   Marital status: Single    Spouse name: Not on file   Number of children: 0   Years of education: 11   Highest education level: Not on file  Occupational History   Occupation: Disabled  Tobacco Use   Smoking status: Never   Smokeless tobacco: Never  Vaping Use   Vaping status: Never Used  Substance and Sexual Activity   Alcohol use: No   Drug use: Not Currently    Types: Marijuana    Comment: last used 10/07/2017   Sexual activity: Never  Other Topics Concern   Not on file  Social History Narrative   Lives alone.  Independent.   Social Drivers of Corporate Investment Banker Strain: Not on file  Food Insecurity: No Food Insecurity (09/13/2024)   Hunger Vital Sign    Worried About Running Out of Food in the Last Year: Never true    Ran Out of Food in the Last Year: Never true  Transportation Needs: No Transportation Needs (09/13/2024)   PRAPARE - Administrator, Civil Service (Medical): No    Lack of Transportation (Non-Medical): No  Recent Concern:  Transportation Needs - Unmet Transportation Needs (07/10/2024)   Received from [facility] - Transportation    In the past 12 months, has lack of transportation kept you from medical appointments or from getting medications?: Yes    Lack of Transportation (Non-Medical): No  Physical Activity: Not on file  Stress: Not on file  Social Connections: Unknown (02/05/2022)   Received from [facility]   Social Network    Social Network: Not on file  Intimate Partner Violence: Not At Risk (09/13/2024)   Humiliation, Afraid, Rape, and Kick questionnaire    Fear of Current or Ex-Partner: No    Emotionally Abused: No    Physically Abused: No    Sexually Abused: No   Family History  Problem Relation Age of Onset   Lupus Father    HIV Brother    Thyroid cancer Maternal Aunt    Vitals BP 124/83 (BP Location: Left Arm)   Pulse 80   Temp 98.1 F (36.7 C) (Oral)   Resp 12   Ht 5' 8 (1.727 m)   Wt 65.7 kg   SpO2 97%   BMI 22.02 kg/m    Physical Exam Constitutional: Adult male lying in the bed, nontoxic appearing    Comments: HEENT WNL  Cardiovascular:     Rate and Rhythm: Normal rate     Heart sounds: S1 and S2  Pulmonary:     Effort: Pulmonary effort is normal.     Comments: Normal lung sounds  Abdominal:     Palpations: Abdomen is soft.     Tenderness: Nondistended and nontender  Musculoskeletal:        General: No swelling or tenderness in peripheral joints, right arm graft removal site with numbness and swelling but no erythema, warmth or tenderness  Skin:    Comments: No rashes, right chest HD catheter site with no swelling, erythema or warmth or tenderness.  No signs of infection  Neurological:     General: Awake, alert and oriented, grossly nonfocal  Psychiatric:        Mood and Affect: Mood normal.    Pertinent Microbiology Results for orders placed or performed during the hospital encounter of 09/11/24  Blood culture (routine x 2)      Status: Abnormal   Collection Time: 09/11/24  7:39 PM   Specimen: BLOOD LEFT HAND  Result Value Ref Range Status   Specimen Description BLOOD LEFT HAND  Final   Special Requests   Final    BOTTLES DRAWN AEROBIC AND ANAEROBIC Blood Culture results may not  be optimal due to an inadequate volume of blood received in culture bottles   Culture  Setup Time   Final    GRAM POSITIVE COCCI IN CLUSTERS IN BOTH AEROBIC AND ANAEROBIC BOTTLES CRITICAL RESULT CALLED TO, READ BACK BY AND VERIFIED WITH: PHARMD LYDIA CHEN ON 09/12/24 @ 1701 BY DRT Performed at [facility], [address] [place], [place] [address]    Culture STAPHYLOCOCCUS EPIDERMIDIS (A)  Final   Report Status 09/16/2024 FINAL  Final   Organism ID, Bacteria STAPHYLOCOCCUS EPIDERMIDIS  Final      Susceptibility   Staphylococcus epidermidis - MIC*    CIPROFLOXACIN <=0.5 SENSITIVE Sensitive     ERYTHROMYCIN <=0.25 SENSITIVE Sensitive     GENTAMICIN <=0.5 SENSITIVE Sensitive     OXACILLIN >=4 RESISTANT Resistant     TETRACYCLINE <=1 SENSITIVE Sensitive     VANCOMYCIN 1 SENSITIVE Sensitive     TRIMETH /SULFA  80 RESISTANT Resistant     CLINDAMYCIN <=0.25 SENSITIVE Sensitive     RIFAMPIN <=0.5 SENSITIVE Sensitive     Inducible Clindamycin NEGATIVE Sensitive     * STAPHYLOCOCCUS EPIDERMIDIS  Blood Culture ID Panel (Reflexed)     Status: Abnormal   Collection Time: 09/11/24  7:39 PM  Result Value Ref Range Status   Enterococcus faecalis NOT DETECTED NOT DETECTED Final   Enterococcus Faecium NOT DETECTED NOT DETECTED Final   Listeria monocytogenes NOT DETECTED NOT DETECTED Final   Staphylococcus species DETECTED (A) NOT DETECTED Final    Comment: CRITICAL RESULT CALLED TO, READ BACK BY AND VERIFIED WITH: PHARMD LYDIA CHEN ON 09/12/24 @ 1701 BY DRT    Staphylococcus aureus (BCID) NOT DETECTED NOT DETECTED Final   Staphylococcus epidermidis DETECTED (A) NOT DETECTED Final    Comment: Methicillin (oxacillin) resistant coagulase  negative staphylococcus. Possible blood culture contaminant (unless isolated from more than one blood culture draw or clinical case suggests pathogenicity). No antibiotic treatment is indicated for blood  culture contaminants. CRITICAL RESULT CALLED TO, READ BACK BY AND VERIFIED WITH: PHARMD LYDIA CHEN ON 09/12/24 @ 1701 BY DRT    Staphylococcus lugdunensis NOT DETECTED NOT DETECTED Final   Streptococcus species NOT DETECTED NOT DETECTED Final   Streptococcus agalactiae NOT DETECTED NOT DETECTED Final   Streptococcus pneumoniae NOT DETECTED NOT DETECTED Final   Streptococcus pyogenes NOT DETECTED NOT DETECTED Final   A.calcoaceticus-baumannii NOT DETECTED NOT DETECTED Final   Bacteroides fragilis NOT DETECTED NOT DETECTED Final   Enterobacterales NOT DETECTED NOT DETECTED Final   Enterobacter cloacae complex NOT DETECTED NOT DETECTED Final   Escherichia coli NOT DETECTED NOT DETECTED Final   Klebsiella aerogenes NOT DETECTED NOT DETECTED Final   Klebsiella oxytoca NOT DETECTED NOT DETECTED Final   Klebsiella pneumoniae NOT DETECTED NOT DETECTED Final   Proteus species NOT DETECTED NOT DETECTED Final   Salmonella species NOT DETECTED NOT DETECTED Final   Serratia marcescens NOT DETECTED NOT DETECTED Final   Haemophilus influenzae NOT DETECTED NOT DETECTED Final   Neisseria meningitidis NOT DETECTED NOT DETECTED Final   Pseudomonas aeruginosa NOT DETECTED NOT DETECTED Final   Stenotrophomonas maltophilia NOT DETECTED NOT DETECTED Final   Candida albicans NOT DETECTED NOT DETECTED Final   Candida auris NOT DETECTED NOT DETECTED Final   Candida glabrata NOT DETECTED NOT DETECTED Final   Candida krusei NOT DETECTED NOT DETECTED Final   Candida parapsilosis NOT DETECTED NOT DETECTED Final   Candida tropicalis NOT DETECTED NOT DETECTED Final   Cryptococcus neoformans/gattii NOT DETECTED NOT DETECTED Final  Methicillin resistance mecA/C DETECTED (A) NOT DETECTED Final    Comment: CRITICAL  RESULT CALLED TO, READ BACK BY AND VERIFIED WITH: PHARMD LYDIA CHEN ON 09/12/24 @ 1701 BY DRT Performed at [facility], [address], [place], [place] [address]   Surgical pcr screen     Status: None   Collection Time: 09/12/24 12:59 PM   Specimen: Nasal Mucosa; Nasal Swab  Result Value Ref Range Status   MRSA, PCR NEGATIVE NEGATIVE Final   Staphylococcus aureus NEGATIVE NEGATIVE Final    Comment: (NOTE) The Xpert SA Assay (FDA approved for NASAL specimens in patients 56 years of age and older), is one component of a comprehensive surveillance program. It is not intended to diagnose infection nor to guide or monitor treatment. Performed at [facility], [address], [place], [place] [address]   Aerobic/Anaerobic Culture w Gram Stain (surgical/deep wound)     Status: None (Preliminary result)   Collection Time: 09/12/24  3:41 PM   Specimen: Path fluid; Body Fluid  Result Value Ref Range Status   Specimen Description WOUND RIGHT ARM  Final   Special Requests SWABS  Final   Gram Stain   Final    NO WBC SEEN NO ORGANISMS SEEN Performed at [facility], [address] [place], [place] [address]    Culture   Final    ABUNDANT STAPHYLOCOCCUS EPIDERMIDIS NO ANAEROBES ISOLATED; CULTURE IN PROGRESS FOR 5 DAYS    Report Status PENDING  Incomplete   Organism ID, Bacteria STAPHYLOCOCCUS EPIDERMIDIS  Final      Susceptibility   Staphylococcus epidermidis - MIC*    CIPROFLOXACIN <=0.5 SENSITIVE Sensitive     ERYTHROMYCIN <=0.25 SENSITIVE Sensitive     GENTAMICIN <=0.5 SENSITIVE Sensitive     OXACILLIN >=4 RESISTANT Resistant     TETRACYCLINE <=1 SENSITIVE Sensitive     VANCOMYCIN 1 SENSITIVE Sensitive     TRIMETH /SULFA  80 RESISTANT Resistant     CLINDAMYCIN <=0.25 SENSITIVE Sensitive     RIFAMPIN <=0.5 SENSITIVE Sensitive     Inducible Clindamycin NEGATIVE Sensitive     * ABUNDANT STAPHYLOCOCCUS EPIDERMIDIS   Pertinent Lab seen by me:    Latest Ref Rng &  Units 09/13/2024    2:50 AM 09/12/2024    1:40 PM 09/12/2024    4:55 AM  CBC  WBC 4.0 - 10.5 K/uL 4.6   5.4   Hemoglobin 13.0 - 17.0 g/dL 9.7  86.3  89.0   Hematocrit 39.0 - 52.0 % 30.2  40.0  36.1   Platelets 150 - 400 K/uL 158   176       Latest Ref Rng & Units 09/13/2024    2:11 PM 09/13/2024    2:50 AM 09/12/2024    1:40 PM  CMP  Glucose 70 - 99 mg/dL  865  83   BUN 6 - 20 mg/dL  35  29   Creatinine 9.38 - 1.24 mg/dL  1.95  3.49   Sodium 864 - 145 mmol/L  132  136   Potassium 3.5 - 5.1 mmol/L 4.8  5.6  4.5   Chloride 98 - 111 mmol/L  96  96   CO2 22 - 32 mmol/L  23    Calcium 8.9 - 10.3 mg/dL  8.6    Total Protein 6.5 - 8.1 g/dL  7.0    Total Bilirubin 0.0 - 1.2 mg/dL  0.3    Alkaline Phos 38 - 126 U/L  50  AST 15 - 41 U/L  13    ALT 0 - 44 U/L  9       Pertinent Imagings/Other Imagings Plain films and CT images have been personally visualized and interpreted; radiology reports have been reviewed. Decision making incorporated into the Impression / Recommendations.  UE VENOUS DUPLEX (7am - 7pm) Result Date: 09/14/2024 UPPER VENOUS STUDY  Patient Name:  DAVEY LIMAS  Date of Exam:   09/14/2024 Medical Rec #: [id number]        Accession #:    [id number] Date of Birth: [birth year]        Patient Gender: M Patient Age:   62 years Exam Location:  [facility] Procedure:      VAS US  UPPER EXTREMITY VENOUS DUPLEX Referring Phys: [facility] --------------------------------------------------------------------------------  Indications: Swelling, and Pain Other Indications: Status post - EXCISION OF RIGHT UPPER ARM VEIN GRAFT 09/12/24. Limitations: Poor ultrasound/tissue interface and right upper arm painful to touch. Performing Technologist: Ricka Sturdivant-Jones RDMS, RVT  Examination Guidelines: A complete evaluation includes B-mode imaging, spectral Doppler, color Doppler, and power Doppler as needed of all accessible portions of each vessel. Bilateral testing is considered an  integral part of a complete examination. Limited examinations for reoccurring indications may be performed as noted.  Right Findings: +----------+------------+---------+-----------+----------+--------------+ RIGHT     CompressiblePhasicitySpontaneousProperties   Summary     +----------+------------+---------+-----------+----------+--------------+ IJV           Full       Yes       Yes                             +----------+------------+---------+-----------+----------+--------------+ Subclavian               Yes       No               dampened flow  +----------+------------+---------+-----------+----------+--------------+ Axillary                 Yes       Yes                             +----------+------------+---------+-----------+----------+--------------+ Brachial                 Yes       Yes                             +----------+------------+---------+-----------+----------+--------------+ Radial        Full                                                 +----------+------------+---------+-----------+----------+--------------+ Ulnar         Full                                                 +----------+------------+---------+-----------+----------+--------------+ Cephalic      Full                                                 +----------+------------+---------+-----------+----------+--------------+  Basilic                                             Not visualized +----------+------------+---------+-----------+----------+--------------+ Right upper arm patent with color. Limted evaluation, too painful to compress veins. Right subclavian vein is patent with dampened continuous flow which may suggest a more proximal obstruction.  Left Findings: +----------+------------+---------+-----------+----------+-------+ LEFT      CompressiblePhasicitySpontaneousPropertiesSummary  +----------+------------+---------+-----------+----------+-------+ Subclavian               Yes       Yes                      +----------+------------+---------+-----------+----------+-------+  Summary:  Right: The right upper extremity appears patent with color doppler with no evidence of deep vein thrombosis in the veins that were clearly visualized.  Left: No evidence of thrombosis in the subclavian.  *See table(s) above for measurements and observations.  Diagnosing physician: Debby Robertson Electronically signed by Debby Robertson on 09/14/2024 at 8:39:11 PM.    Final    CT HUMERUS RIGHT W CONTRAST Result Date: 09/11/2024 CLINICAL DATA:  Soft tissue mass of upper arm. Recent graft placement. EXAM: CT OF THE UPPER RIGHT EXTREMITY WITH CONTRAST TECHNIQUE: Multidetector CT imaging of the upper right extremity was performed according to the standard protocol following intravenous contrast administration. RADIATION DOSE REDUCTION: This exam was performed according to the departmental dose-optimization program which includes automated exposure control, adjustment of the mA and/or kV according to patient size and/or use of iterative reconstruction technique. CONTRAST:  75mL OMNIPAQUE  IOHEXOL  350 MG/ML SOLN COMPARISON:  None Available. FINDINGS: There is no acute fracture or focal osseous lesion. There is no significant elbow or shoulder joint effusion. A graft is present connecting the left axillary vein to a vessel at the level of the distal humerus. Graft is grossly patent, but evaluation is limited secondary to timing of the contrast bolus. There is mild subcutaneous stranding surrounding the entire graft. Adjacent to the distal 2 cm of the graft there is an enhancing fluid collection measuring 2.7 x 2.2 by 2.8 cm. This abuts the graft. There is no contrast opacification within this collection. There is surrounding inflammatory stranding. Multiple superficial collateral vessels are seen along the right  anterior chest wall and within the proximal right arm. There is a right upper lobe nodule measuring 5 mm on image 4/50. IMPRESSION: 1. 2.8 cm enhancing fluid collection adjacent to the distal 2 cm of the graft in the right upper extremity. This is concerning for abscess. 2. Mild subcutaneous stranding surrounding the entire graft. 3. 5 mm right upper lobe pulmonary nodule. No follow-up needed if patient is low-risk.This recommendation follows the consensus statement: Guidelines for Management of Incidental Pulmonary Nodules Detected on CT Images: From the Fleischner Society 2017; Radiology 2017; 284:228-243. Electronically Signed   By: Greig Pique M.D.   On: 09/11/2024 23:11   I spent 82 minutes involved in face-to-face and non-face-to-face activities for this patient on the day of the visit. Professional time spent includes the following activities: Preparing to see the patient (review of tests), Obtaining and reviewing separately obtained history (H&P, hospitalist progress note, vascular surgery note), Performing a medically appropriate examination and evaluation , Ordering medications/labs/imaging, referring and communicating with other health care professionals, Documenting clinical information in the EMR, Independently interpreting results (not separately reported), Communicating results to the patient,  Counseling and educating the patient and Care coordination (not separately reported).  Electronically signed by:   Plan d/w requesting provider as well as ID pharm D  Of note, portions of this note may have been created with voice recognition software. While this note has been edited for accuracy, occasional wrong-word or 'sound-a-like' substitutions may have occurred due to the inherent limitations of voice recognition software.   Annalee Orem, MD Infectious Disease Physician [facility] for Infect[facility] Pager: [phone]

## 2024-09-16 NOTE — TOC Transition Note (Signed)
 Transition of Care [facility]) - Discharge Note   Patient Details  Name: Jorge [name] MRN: [id number] Date of Birth: 01-09-1981  Transition of Care [facility]) CM/SW Contact:  Waddell Barnie Rama, RN Phone Number: 09/16/2024, 9:08 AM   Clinical Narrative:    For dc today, has no needs.         Patient Goals and CMS Choice            Discharge Placement                       Discharge Plan and Services Additional resources added to the After Visit Summary for                                       Social Drivers of Health (SDOH) Interventions SDOH Screenings   Food Insecurity: No Food Insecurity (09/13/2024)  Housing: Low Risk  (09/13/2024)  Transportation Needs: No Transportation Needs (09/13/2024)  Recent Concern: Transportation Needs - Unmet Transportation Needs (07/10/2024)   Received from [facility]  Utilities: Not At Risk (09/13/2024)  Social Connections: Unknown (02/05/2022)   Received from [facility]  Tobacco Use: Low Risk  (09/12/2024)     Readmission Risk Interventions    09/12/2024   12:56 PM  Readmission Risk Prevention Plan  Transportation Screening Complete  PCP or Specialist Appt within 5-7 Days Complete  Home Care Screening Complete  Medication Review (RN CM) Complete

## 2024-09-16 NOTE — Plan of Care (Signed)
  Problem: Clinical Measurements: Goal: Ability to avoid or minimize complications of infection will improve Outcome: Progressing   Problem: Skin Integrity: Goal: Skin integrity will improve Outcome: Progressing   Problem: Clinical Measurements: Goal: Ability to maintain clinical measurements within normal limits will improve Outcome: Progressing Goal: Will remain free from infection Outcome: Progressing Goal: Diagnostic test results will improve Outcome: Progressing Goal: Respiratory complications will improve Outcome: Progressing Goal: Cardiovascular complication will be avoided Outcome: Progressing

## 2024-09-16 NOTE — Progress Notes (Signed)
 Progress Note   Patient: Jorge [name] FMW:[id number] DOB: 17-Jan-1981 DOA: 09/11/2024  DOS: the patient was seen and examined on 09/16/2024   Brief hospital course:  43 y.o. African-American male with medical history significant for asthma, GERD, essential hypertension, and SLE, who presented to the emergency room with acute onset of right arm swelling and pain at the site of his AV fistula with associated warmth.    Assessment and Plan:   Concern for infected right arm AV graft with abscess -S/p drainage of abscess by vascular surgery 12/5.  Initial cultures negative however wound culture being reintubated due to possible monomicrobial gram positive cocci.  Repeat culture noting Staph epidermidis.  Continue on empiric antibiotics for now.  Wound care per vascular surgery.  Can follow-up with vascular surgery in 2-3 weeks for incision check.    Staph epidermidis bacteremia - Wound culture and blood culture show staph epidermis.  Will consult infectious disease.  Likely repeat blood cultures to ensure clearance of bacteremia.  Low concern for endocarditis.   ESRD on HD - Continue HD MWF.  Tolerated HD 12/8, -2 L.  Nephrology following closely.   Hypertensive urgency - Appears to be resolved at this time.  Continue amlodipine , labetalol .   Hyperkalemia - Potassium 6.7 on presentation.  Improved after HD.  Will DC on Lokelma  10 g daily.   Goals of care - Patient was scheduled to be discharged today however unfortunately one of his blood cultures and wound cultures returned positive for staph.  Infectious disease consulting, repeat blood cultures, and delay safe discharge.  Subjective: Patient evaluated this morning.  Denies any fever, chills, chest pain, nausea, vomiting, abdominal pain.  Felt better than yesterday after dialysis.  Eager for discharge home.  Unfortunately will not be leaving just yet secondary to above.  Physical Exam:  Vitals:   09/15/24 2300 09/16/24 0300 09/16/24  0720 09/16/24 1105  BP: 123/79 113/77 124/83 114/79  Pulse: 87 90 80 88  Resp: 16 15 12 12   Temp: 98 F (36.7 C) 98.4 F (36.9 C) 98.1 F (36.7 C) 98 F (36.7 C)  TempSrc: Oral Oral Oral Oral  SpO2: 98% 98% 97% 98%  Weight:      Height:        GENERAL:  Alert, pleasant, no acute distress  HEENT:  EOMI CARDIOVASCULAR:  RRR, no murmurs appreciated RESPIRATORY:  Clear to auscultation, no wheezing, rales, or rhonchi GASTROINTESTINAL:  Soft, nontender, nondistended EXTREMITIES:  No LE edema bilaterally NEURO:  No new focal deficits appreciated SKIN: Well-healing right AV fistula incision PSYCH:  Appropriate mood and affect    Data Reviewed:  Imaging Studies: UE VENOUS DUPLEX (7am - 7pm) Result Date: 09/14/2024 UPPER VENOUS STUDY  Patient Name:  Jorge [name]  Date of Exam:   09/14/2024 Medical Rec #: [id number]        Accession #:    [id number] Date of Birth: Mar 30, 1981        Patient Gender: M Patient Age:   42 years Exam Location:  [facility] Procedure:      VAS US  UPPER EXTREMITY VENOUS DUPLEX Referring Phys: [facility] --------------------------------------------------------------------------------  Indications: Swelling, and Pain Other Indications: Status post - EXCISION OF RIGHT UPPER ARM VEIN GRAFT 09/12/24. Limitations: Poor ultrasound/tissue interface and right upper arm painful to touch. Performing Technologist: Ricka Sturdivant-Jones RDMS, RVT  Examination Guidelines: A complete evaluation includes B-mode imaging, spectral Doppler, color Doppler, and power Doppler as needed of all accessible portions of each vessel. Bilateral testing is  considered an integral part of a complete examination. Limited examinations for reoccurring indications may be performed as noted.  Right Findings: +----------+------------+---------+-----------+----------+--------------+ RIGHT     CompressiblePhasicitySpontaneousProperties   Summary      +----------+------------+---------+-----------+----------+--------------+ IJV           Full       Yes       Yes                             +----------+------------+---------+-----------+----------+--------------+ Subclavian               Yes       No               dampened flow  +----------+------------+---------+-----------+----------+--------------+ Axillary                 Yes       Yes                             +----------+------------+---------+-----------+----------+--------------+ Brachial                 Yes       Yes                             +----------+------------+---------+-----------+----------+--------------+ Radial        Full                                                 +----------+------------+---------+-----------+----------+--------------+ Ulnar         Full                                                 +----------+------------+---------+-----------+----------+--------------+ Cephalic      Full                                                 +----------+------------+---------+-----------+----------+--------------+ Basilic                                             Not visualized +----------+------------+---------+-----------+----------+--------------+ Right upper arm patent with color. Limted evaluation, too painful to compress veins. Right subclavian vein is patent with dampened continuous flow which may suggest a more proximal obstruction.  Left Findings: +----------+------------+---------+-----------+----------+-------+ LEFT      CompressiblePhasicitySpontaneousPropertiesSummary +----------+------------+---------+-----------+----------+-------+ Subclavian               Yes       Yes                      +----------+------------+---------+-----------+----------+-------+  Summary:  Right: The right upper extremity appears patent with color doppler with no evidence of deep vein thrombosis in the veins that were  clearly visualized.  Left: No evidence of thrombosis in the subclavian.  *See table(s) above for measurements and observations.  Diagnosing physician: Debby Robertson Electronically signed  by Debby Robertson on 09/14/2024 at 8:39:11 PM.    Final    CT HUMERUS RIGHT W CONTRAST Result Date: 09/11/2024 CLINICAL DATA:  Soft tissue mass of upper arm. Recent graft placement. EXAM: CT OF THE UPPER RIGHT EXTREMITY WITH CONTRAST TECHNIQUE: Multidetector CT imaging of the upper right extremity was performed according to the standard protocol following intravenous contrast administration. RADIATION DOSE REDUCTION: This exam was performed according to the departmental dose-optimization program which includes automated exposure control, adjustment of the mA and/or kV according to patient size and/or use of iterative reconstruction technique. CONTRAST:  75mL OMNIPAQUE  IOHEXOL  350 MG/ML SOLN COMPARISON:  None Available. FINDINGS: There is no acute fracture or focal osseous lesion. There is no significant elbow or shoulder joint effusion. A graft is present connecting the left axillary vein to a vessel at the level of the distal humerus. Graft is grossly patent, but evaluation is limited secondary to timing of the contrast bolus. There is mild subcutaneous stranding surrounding the entire graft. Adjacent to the distal 2 cm of the graft there is an enhancing fluid collection measuring 2.7 x 2.2 by 2.8 cm. This abuts the graft. There is no contrast opacification within this collection. There is surrounding inflammatory stranding. Multiple superficial collateral vessels are seen along the right anterior chest wall and within the proximal right arm. There is a right upper lobe nodule measuring 5 mm on image 4/50. IMPRESSION: 1. 2.8 cm enhancing fluid collection adjacent to the distal 2 cm of the graft in the right upper extremity. This is concerning for abscess. 2. Mild subcutaneous stranding surrounding the entire graft. 3. 5 mm right  upper lobe pulmonary nodule. No follow-up needed if patient is low-risk.This recommendation follows the consensus statement: Guidelines for Management of Incidental Pulmonary Nodules Detected on CT Images: From the Fleischner Society 2017; Radiology 2017; 284:228-243. Electronically Signed   By: Greig Pique M.D.   On: 09/11/2024 23:11    There are no new results to review at this time.  Previous records (including but not limited to H&P, progress notes, nursing notes, TOC management) were reviewed in assessment of this patient.  Labs: CBC: Recent Labs  Lab 09/11/24 1939 09/12/24 0027 09/12/24 0455 09/12/24 1340 09/13/24 0250  WBC 6.3 7.0 5.4  --  4.6  NEUTROABS 4.0  --   --   --   --   HGB 13.4 12.1* 10.9* 13.6 9.7*  HCT 43.4 40.3 36.1* 40.0 30.2*  MCV 88.2 90.4 90.3  --  86.5  PLT 197 149* 176  --  158   Basic Metabolic Panel: Recent Labs  Lab 09/11/24 1939 09/12/24 0028 09/12/24 0455 09/12/24 1340 09/13/24 0250 09/13/24 1411  NA 139 138 138 136 132*  --   K 6.7* 5.2* 4.8 4.5 5.6* 4.8  CL 103 102 101 96* 96*  --   CO2 22 21* 18*  --  23  --   GLUCOSE 82 101* 96 83 134*  --   BUN 52* 57* 59* 29* 35*  --   CREATININE 10.56* 10.49* 10.61* 6.50* 8.04*  --   CALCIUM 8.7* 7.9* 8.1*  --  8.6*  --   PHOS  --  6.1*  --   --  7.5*  --    Liver Function Tests: Recent Labs  Lab 09/11/24 1939 09/12/24 0028 09/13/24 0250  AST 16  --  13*  ALT 15  --  9  ALKPHOS 72  --  50  BILITOT 0.4  --  0.3  PROT 9.3*  --  7.0  ALBUMIN  4.0 3.1* 2.9*   CBG: No results for input(s): GLUCAP in the last 168 hours.  Scheduled Meds:  amLODipine   10 mg Oral q morning   Chlorhexidine  Gluconate Cloth  6 each Topical Q0600   heparin   5,000 Units Subcutaneous Q8H   hydroxychloroquine   200 mg Oral BH-q7a   labetalol   100 mg Oral BID   linezolid   600 mg Oral Q12H   montelukast   10 mg Oral Daily   sevelamer  carbonate  1,600 mg Oral TID WC   Continuous Infusions:  [START ON 09/17/2024]  ceFEPime  (MAXIPIME ) IV     PRN Meds:.acetaminophen  **OR** acetaminophen , albuterol , hydrALAZINE , labetalol , morphine  injection, ondansetron  **OR** ondansetron  (ZOFRAN ) IV, traZODone   Family Communication: None at bedside  Disposition: Status is: Inpatient Remains inpatient appropriate because: Bacteremia     Time spent: 40 minutes  Length of inpatient stay: 4 days  Author: Carliss LELON Canales, DO 09/16/2024 12:09 PM  For on call review [email or web address].

## 2024-09-17 DIAGNOSIS — B957 Other staphylococcus as the cause of diseases classified elsewhere: Secondary | ICD-10-CM

## 2024-09-17 LAB — CK: Total CK: 87 U/L (ref 49–397)

## 2024-09-17 LAB — CBC
HCT: 29.3 % — ABNORMAL LOW (ref 39.0–52.0)
Hemoglobin: 9.1 g/dL — ABNORMAL LOW (ref 13.0–17.0)
MCH: 27.4 pg (ref 26.0–34.0)
MCHC: 31.1 g/dL (ref 30.0–36.0)
MCV: 88.3 fL (ref 80.0–100.0)
Platelets: 234 K/uL (ref 150–400)
RBC: 3.32 MIL/uL — ABNORMAL LOW (ref 4.22–5.81)
RDW: 13.2 % (ref 11.5–15.5)
WBC: 5 K/uL (ref 4.0–10.5)
nRBC: 0 % (ref 0.0–0.2)

## 2024-09-17 LAB — BASIC METABOLIC PANEL WITH GFR
Anion gap: 12 (ref 5–15)
BUN: 49 mg/dL — ABNORMAL HIGH (ref 6–20)
CO2: 27 mmol/L (ref 22–32)
Calcium: 8.4 mg/dL — ABNORMAL LOW (ref 8.9–10.3)
Chloride: 93 mmol/L — ABNORMAL LOW (ref 98–111)
Creatinine, Ser: 11 mg/dL — ABNORMAL HIGH (ref 0.61–1.24)
GFR, Estimated: 5 mL/min — ABNORMAL LOW (ref >60)
Glucose, Bld: 93 mg/dL (ref 70–99)
Potassium: 4.5 mmol/L (ref 3.5–5.1)
Sodium: 132 mmol/L — ABNORMAL LOW (ref 135–145)

## 2024-09-17 LAB — AEROBIC/ANAEROBIC CULTURE W GRAM STAIN (SURGICAL/DEEP WOUND): Gram Stain: NONE SEEN

## 2024-09-17 LAB — PHOSPHORUS: Phosphorus: 8.5 mg/dL — ABNORMAL HIGH (ref 2.5–4.6)

## 2024-09-17 LAB — MAGNESIUM: Magnesium: 2.1 mg/dL (ref 1.7–2.4)

## 2024-09-17 MED ORDER — HEPARIN SODIUM (PORCINE) 1000 UNIT/ML DIALYSIS
1000.0000 [IU] | INTRAMUSCULAR | Status: DC | PRN
  Administered 2024-09-17: 3200 [IU]

## 2024-09-17 MED ORDER — PENTAFLUOROPROP-TETRAFLUOROETH EX AERO: 1.0000 | INHALATION_SPRAY | CUTANEOUS | Status: DC | PRN

## 2024-09-17 MED ORDER — NEPRO/CARBSTEADY PO LIQD: 237.0000 mL | ORAL | Status: DC | PRN

## 2024-09-17 MED ORDER — ALTEPLASE 2 MG IJ SOLR: 2.0000 mg | Freq: Once | INTRAMUSCULAR | Status: DC | PRN

## 2024-09-17 MED ORDER — ANTICOAGULANT SODIUM CITRATE 4% (200MG/5ML) IV SOLN: 5.0000 mL | Status: DC | PRN

## 2024-09-17 MED ORDER — HEPARIN SODIUM (PORCINE) 1000 UNIT/ML IJ SOLN
INTRAMUSCULAR | Status: AC
  Filled 2024-09-17: qty 5

## 2024-09-17 MED ORDER — LIDOCAINE HCL (PF) 1 % IJ SOLN: 5.0000 mL | INTRAMUSCULAR | Status: DC | PRN

## 2024-09-17 MED ORDER — HEPARIN SODIUM (PORCINE) 1000 UNIT/ML IJ SOLN
INTRAMUSCULAR | Status: AC
  Filled 2024-09-17: qty 4

## 2024-09-17 MED ORDER — LIDOCAINE-PRILOCAINE 2.5-2.5 % EX CREA: 1.0000 | TOPICAL_CREAM | CUTANEOUS | Status: DC | PRN

## 2024-09-17 MED ORDER — HEPARIN SODIUM (PORCINE) 1000 UNIT/ML DIALYSIS
2500.0000 [IU] | Freq: Once | INTRAMUSCULAR | Status: AC
  Administered 2024-09-17: 2500 [IU] via INTRAVENOUS_CENTRAL

## 2024-09-17 MED ORDER — HEPARIN SODIUM (PORCINE) 1000 UNIT/ML DIALYSIS
1500.0000 [IU] | INTRAMUSCULAR | Status: AC | PRN
  Administered 2024-09-17: 1500 [IU] via INTRAVENOUS_CENTRAL

## 2024-09-17 NOTE — Progress Notes (Signed)
°  Jorge [name] Progress Note   Subjective: Not feeling great Seen in HD unit  Objective Vitals:   09/17/24 0849 09/17/24 0905 09/17/24 0935 09/17/24 1005  BP: (!) 144/105 (!) 154/100 (!) 138/98 (!) 135/95  Pulse: 75 74 75 76  Resp: 11 10 10 11   Temp:      TempSrc:      SpO2: 100% 97% 98% 99%  Weight:      Height:       Physical Exam General:alert, well appearing male in NAD Heart:RRR, no mrg Lungs:CTAB, nml WOB on RA Abdomen:soft , NTND  Extremities:no LE edema, Right arm dressed in ACE wrap Dialysis Access: TDC    OP HD: [facility] - MWF TDC / AVG now removed  Assessment/Plan: 1. AVG infection and seroma - excision of RU AVG and seroma drainage, with vein patch angioplasty by Dr. Gretta.  Blood cx's were 2/2+ for MRSE. Seen by ID, plan is for repeat blood cx's to make sure they are negative. IV abx changed to daptomycin , TTE ordered. Pt's outpatient unit has ordered the daptomycin .  2. ESRD -on HD MWF.  HD today.  3. Hyperkalemia - get labs in the am 4. Anemia of CKD- Hgb 9-10 range.  No indication for ESA at this time.   5. HTN/volume - Blood pressure in goal. Continue home meds. Low UF goal today.  6. Nutrition -  Renal diet w/fluid restrictions. Reviewed low K diet.   7. SLE - on plaquenil .    Myer Fret  MD  [facility] 09/17/2024, 10:21 AM  Recent Labs  Lab 09/12/24 0028 09/12/24 0455 09/13/24 0250 09/13/24 1411 09/17/24 0225 09/17/24 0825  HGB  --    < > 9.7*  --   --  9.1*  ALBUMIN  3.1*  --  2.9*  --   --   --   CALCIUM 7.9*   < > 8.6*  --  8.4*  --   PHOS 6.1*  --  7.5*  --  8.5*  --   CREATININE 10.49*   < > 8.04*  --  11.00*  --   K 5.2*   < > 5.6* 4.8 4.5  --    < > = values in this interval not displayed.    Inpatient medications:  amLODipine   10 mg Oral q morning   Chlorhexidine  Gluconate Cloth  6 each Topical Q0600   Chlorhexidine  Gluconate Cloth  6 each Topical Q0600   [START ON 09/18/2024] heparin   2,500 Units Dialysis Once in  dialysis   heparin   5,000 Units Subcutaneous Q8H   hydroxychloroquine   200 mg Oral BH-q7a   labetalol   100 mg Oral BID   linezolid   600 mg Oral Q12H   montelukast   10 mg Oral Daily   sevelamer  carbonate  1,600 mg Oral TID WC    anticoagulant sodium citrate      DAPTOmycin      acetaminophen  **OR** acetaminophen , albuterol , alteplase , anticoagulant sodium citrate , feeding supplement (NEPRO CARB STEADY), heparin , [START ON 09/18/2024] heparin , hydrALAZINE , labetalol , lidocaine  (PF), lidocaine -prilocaine , morphine  injection, ondansetron  **OR** ondansetron  (ZOFRAN ) IV, pentafluoroprop-tetrafluoroeth, traZODone

## 2024-09-17 NOTE — Plan of Care (Signed)
°  Problem: Clinical Measurements: Goal: Ability to avoid or minimize complications of infection will improve Outcome: Progressing   Problem: Skin Integrity: Goal: Skin integrity will improve Outcome: Progressing   Problem: Education: Goal: Knowledge of General Education information will improve Description: Including pain rating scale, medication(s)/side effects and non-pharmacologic comfort measures Outcome: Progressing   Problem: Clinical Measurements: Goal: Ability to maintain clinical measurements within normal limits will improve Outcome: Progressing Goal: Will remain free from infection Outcome: Progressing Goal: Diagnostic test results will improve Outcome: Progressing Goal: Respiratory complications will improve Outcome: Progressing Goal: Cardiovascular complication will be avoided Outcome: Progressing   Problem: Activity: Goal: Risk for activity intolerance will decrease Outcome: Progressing

## 2024-09-17 NOTE — Progress Notes (Signed)
 Progress Note   Patient: Masai Kidd FMW:[id number] DOB: 1980-11-22 DOA: 09/11/2024  DOS: the patient was seen and examined on 09/17/2024   Brief hospital course:  43 y.o. African-American male with medical history significant for asthma, GERD, essential hypertension, and SLE, who presented to the emergency room with acute onset of right arm swelling and pain at the site of his AV fistula with associated warmth.    Assessment and Plan:   Concern for infected right arm AV graft with abscess -S/p drainage of abscess by vascular surgery 12/5.  Initial cultures negative however wound culture being reintubated due to possible monomicrobial gram positive cocci.  Repeat culture noting Staph epidermidis.  Continue on empiric antibiotics for now.  Wound care per vascular surgery.  Can follow-up with vascular surgery in 2-3 weeks for incision check.     Staph epidermidis bacteremia - Wound culture and blood culture show staph epidermis.  Will consult infectious disease.  Likely repeat blood cultures to ensure clearance of bacteremia.  Low concern for endocarditis.  Antibiotics switched to daptomycin .  Ongoing workup and duration of antibiotic therapy dependent on repeat blood cultures.   ESRD on HD - Continue HD MWF.  Tolerated HD 12/8, -2 L.  Nephrology following closely.  HD today.     Hypertensive urgency - Appears to be resolved at this time.  Continue amlodipine , labetalol .   Hyperkalemia - Potassium 6.7 on presentation.  Improved after HD.  Will DC on Lokelma  10 g daily.   Goals of care - Patient was scheduled to be discharged 12/9 however unfortunately one of his blood cultures and wound cultures returned positive for staph.  Infectious disease consulting, repeat blood cultures, and delay safe discharge.   Subjective: Patient evaluated while at HD.  Pleasant, denies any fever, chills, chest pain, nausea, vomiting, abdominal pain.  Was excited about possibly being discharged yesterday  however understands that he needs to confirm his infection is cleared.  Physical Exam:  Vitals:   09/17/24 1105 09/17/24 1134 09/17/24 1155 09/17/24 1204  BP: (!) 139/98 (!) 143/97 (!) 147/101 (!) 145/87  Pulse: 75 81 84 84  Resp: 13 13 11 10   Temp:    98.5 F (36.9 C)  TempSrc:      SpO2: 98% 99% 99% 100%  Weight:    67.9 kg  Height:        GENERAL:  Alert, pleasant, no acute distress  HEENT:  EOMI CARDIOVASCULAR:  RRR, no murmurs appreciated RESPIRATORY:  Clear to auscultation, no wheezing, rales, or rhonchi GASTROINTESTINAL:  Soft, nontender, nondistended EXTREMITIES:  No LE edema bilaterally NEURO:  No new focal deficits appreciated SKIN: Well-healing right AV fistula incision PSYCH:  Appropriate mood and affect    Data Reviewed:  Imaging Studies: ECHOCARDIOGRAM COMPLETE Result Date: 09/16/2024    ECHOCARDIOGRAM REPORT   Patient Name:   Justino Boze Date of Exam: 09/16/2024 Medical Rec #:  [id number]       Height:       68.0 in Accession #:    [id number]      Weight:       144.8 lb Date of Birth:  23-Mar-1981       BSA:          1.782 m Patient Age:    43 years        BP:           114/79 mmHg Patient Gender: M  HR:           80 bpm. Exam Location:  Inpatient Procedure: 2D Echo, Cardiac Doppler and Color Doppler (Both Spectral and Color            Flow Doppler were utilized during procedure). Indications:    Bacteremia  History:        Patient has no prior history of Echocardiogram examinations.                 Risk Factors:Hypertension.  Sonographer:    Philomena Daring Referring Phys: ANNALEE OREM IMPRESSIONS  1. Left ventricular ejection fraction, by estimation, is 60 to 65%. The left ventricle has normal function. The left ventricle has no regional wall motion abnormalities. There is mild concentric left ventricular hypertrophy. Left ventricular diastolic parameters were normal.  2. Right ventricular systolic function is normal. The right ventricular size is  normal.  3. A small pericardial effusion is present. The pericardial effusion is circumferential.  4. The mitral valve is normal in structure. Trivial mitral valve regurgitation. No evidence of mitral stenosis.  5. The aortic valve is normal in structure. Aortic valve regurgitation is not visualized. No aortic stenosis is present.  6. The inferior vena cava is normal in size with greater than 50% respiratory variability, suggesting right atrial pressure of 3 mmHg. FINDINGS  Left Ventricle: Left ventricular ejection fraction, by estimation, is 60 to 65%. The left ventricle has normal function. The left ventricle has no regional wall motion abnormalities. The left ventricular internal cavity size was normal in size. There is  mild concentric left ventricular hypertrophy. Left ventricular diastolic parameters were normal. Right Ventricle: The right ventricular size is normal. No increase in right ventricular wall thickness. Right ventricular systolic function is normal. Left Atrium: Left atrial size was normal in size. Right Atrium: Right atrial size was normal in size. Pericardium: A small pericardial effusion is present. The pericardial effusion is circumferential. Mitral Valve: The mitral valve is normal in structure. Trivial mitral valve regurgitation. No evidence of mitral valve stenosis. Tricuspid Valve: The tricuspid valve is normal in structure. Tricuspid valve regurgitation is not demonstrated. No evidence of tricuspid stenosis. Aortic Valve: The aortic valve is normal in structure. Aortic valve regurgitation is not visualized. No aortic stenosis is present. Aortic valve mean gradient measures 6.0 mmHg. Aortic valve peak gradient measures 11.8 mmHg. Aortic valve area, by VTI measures 1.70 cm. Pulmonic Valve: The pulmonic valve was normal in structure. Pulmonic valve regurgitation is not visualized. No evidence of pulmonic stenosis. Aorta: The aortic root is normal in size and structure. Venous: The inferior  vena cava is normal in size with greater than 50% respiratory variability, suggesting right atrial pressure of 3 mmHg. IAS/Shunts: No atrial level shunt detected by color flow Doppler.  LEFT VENTRICLE PLAX 2D LVIDd:         4.00 cm   Diastology LVIDs:         2.60 cm   LV e' medial:    8.27 cm/s LV PW:         1.00 cm   LV E/e' medial:  11.1 LV IVS:        1.20 cm   LV e' lateral:   10.70 cm/s LVOT diam:     1.90 cm   LV E/e' lateral: 8.6 LV SV:         52 LV SV Index:   29 LVOT Area:     2.84 cm  RIGHT VENTRICLE  IVC RV Basal diam:  3.20 cm     IVC diam: 1.50 cm RV Mid diam:    2.80 cm RV S prime:     11.00 cm/s TAPSE (M-mode): 1.8 cm LEFT ATRIUM             Index        RIGHT ATRIUM           Index LA diam:        2.50 cm 1.40 cm/m   RA Area:     11.90 cm LA Vol (A2C):   39.4 ml 22.11 ml/m  RA Volume:   21.40 ml  12.01 ml/m LA Vol (A4C):   32.1 ml 18.02 ml/m LA Biplane Vol: 36.9 ml 20.71 ml/m  AORTIC VALVE AV Area (Vmax):    1.83 cm AV Area (Vmean):   1.73 cm AV Area (VTI):     1.70 cm AV Vmax:           172.00 cm/s AV Vmean:          118.000 cm/s AV VTI:            0.308 m AV Peak Grad:      11.8 mmHg AV Mean Grad:      6.0 mmHg LVOT Vmax:         111.00 cm/s LVOT Vmean:        71.800 cm/s LVOT VTI:          0.185 m LVOT/AV VTI ratio: 0.60  AORTA Ao Root diam: 2.50 cm MITRAL VALVE                TRICUSPID VALVE MV Area (PHT): 4.57 cm     TR Peak grad:   21.0 mmHg MV Decel Time: 166 msec     TR Vmax:        229.00 cm/s MV E velocity: 91.70 cm/s MV A velocity: 102.00 cm/s  SHUNTS MV E/A ratio:  0.90         Systemic VTI:  0.18 m                             Systemic Diam: 1.90 cm Kardie Tobb DO Electronically signed by Dub Huntsman DO Signature Date/Time: 09/16/2024/4:20:40 PM    Final    UE VENOUS DUPLEX (7am - 7pm) Result Date: 09/14/2024 UPPER VENOUS STUDY  Patient Name:  HIAWATHA MERRIOTT  Date of Exam:   09/14/2024 Medical Rec #: [id number]        Accession #:    [id number] Date of Birth:  [birth year]        Patient Gender: M Patient Age:   56 years Exam Location:  [facility] Procedure:      VAS US  UPPER EXTREMITY VENOUS DUPLEX Referring Phys: [facility] --------------------------------------------------------------------------------  Indications: Swelling, and Pain Other Indications: Status post - EXCISION OF RIGHT UPPER ARM VEIN GRAFT 09/12/24. Limitations: Poor ultrasound/tissue interface and right upper arm painful to touch. Performing Technologist: Ricka Sturdivant-Jones RDMS, RVT  Examination Guidelines: A complete evaluation includes B-mode imaging, spectral Doppler, color Doppler, and power Doppler as needed of all accessible portions of each vessel. Bilateral testing is considered an integral part of a complete examination. Limited examinations for reoccurring indications may be performed as noted.  Right Findings: +----------+------------+---------+-----------+----------+--------------+ RIGHT     CompressiblePhasicitySpontaneousProperties   Summary     +----------+------------+---------+-----------+----------+--------------+ IJV           Full  Yes       Yes                             +----------+------------+---------+-----------+----------+--------------+ Subclavian               Yes       No               dampened flow  +----------+------------+---------+-----------+----------+--------------+ Axillary                 Yes       Yes                             +----------+------------+---------+-----------+----------+--------------+ Brachial                 Yes       Yes                             +----------+------------+---------+-----------+----------+--------------+ Radial        Full                                                 +----------+------------+---------+-----------+----------+--------------+ Ulnar         Full                                                  +----------+------------+---------+-----------+----------+--------------+ Cephalic      Full                                                 +----------+------------+---------+-----------+----------+--------------+ Basilic                                             Not visualized +----------+------------+---------+-----------+----------+--------------+ Right upper arm patent with color. Limted evaluation, too painful to compress veins. Right subclavian vein is patent with dampened continuous flow which may suggest a more proximal obstruction.  Left Findings: +----------+------------+---------+-----------+----------+-------+ LEFT      CompressiblePhasicitySpontaneousPropertiesSummary +----------+------------+---------+-----------+----------+-------+ Subclavian               Yes       Yes                      +----------+------------+---------+-----------+----------+-------+  Summary:  Right: The right upper extremity appears patent with color doppler with no evidence of deep vein thrombosis in the veins that were clearly visualized.  Left: No evidence of thrombosis in the subclavian.  *See table(s) above for measurements and observations.  Diagnosing physician: Debby Robertson Electronically signed by Debby Robertson on 09/14/2024 at 8:39:11 PM.    Final    CT HUMERUS RIGHT W CONTRAST Result Date: 09/11/2024 CLINICAL DATA:  Soft tissue mass of upper arm. Recent graft placement. EXAM: CT OF THE UPPER RIGHT EXTREMITY WITH CONTRAST TECHNIQUE: Multidetector CT imaging of the upper right extremity was performed  according to the standard protocol following intravenous contrast administration. RADIATION DOSE REDUCTION: This exam was performed according to the departmental dose-optimization program which includes automated exposure control, adjustment of the mA and/or kV according to patient size and/or use of iterative reconstruction technique. CONTRAST:  75mL OMNIPAQUE  IOHEXOL  350 MG/ML SOLN  COMPARISON:  None Available. FINDINGS: There is no acute fracture or focal osseous lesion. There is no significant elbow or shoulder joint effusion. A graft is present connecting the left axillary vein to a vessel at the level of the distal humerus. Graft is grossly patent, but evaluation is limited secondary to timing of the contrast bolus. There is mild subcutaneous stranding surrounding the entire graft. Adjacent to the distal 2 cm of the graft there is an enhancing fluid collection measuring 2.7 x 2.2 by 2.8 cm. This abuts the graft. There is no contrast opacification within this collection. There is surrounding inflammatory stranding. Multiple superficial collateral vessels are seen along the right anterior chest wall and within the proximal right arm. There is a right upper lobe nodule measuring 5 mm on image 4/50. IMPRESSION: 1. 2.8 cm enhancing fluid collection adjacent to the distal 2 cm of the graft in the right upper extremity. This is concerning for abscess. 2. Mild subcutaneous stranding surrounding the entire graft. 3. 5 mm right upper lobe pulmonary nodule. No follow-up needed if patient is low-risk.This recommendation follows the consensus statement: Guidelines for Management of Incidental Pulmonary Nodules Detected on CT Images: From the Fleischner Society 2017; Radiology 2017; 284:228-243. Electronically Signed   By: Greig Pique M.D.   On: 09/11/2024 23:11    Results are pending, will review when available.  Previous records (including but not limited to H&P, progress notes, nursing notes, TOC management) were reviewed in assessment of this patient.  Labs: CBC: Recent Labs  Lab 09/11/24 1939 09/12/24 0027 09/12/24 0455 09/12/24 1340 09/13/24 0250 09/17/24 0825  WBC 6.3 7.0 5.4  --  4.6 5.0  NEUTROABS 4.0  --   --   --   --   --   HGB 13.4 12.1* 10.9* 13.6 9.7* 9.1*  HCT 43.4 40.3 36.1* 40.0 30.2* 29.3*  MCV 88.2 90.4 90.3  --  86.5 88.3  PLT 197 149* 176  --  158 234    Basic Metabolic Panel: Recent Labs  Lab 09/11/24 1939 09/12/24 0028 09/12/24 0455 09/12/24 1340 09/13/24 0250 09/13/24 1411 09/17/24 0225  NA 139 138 138 136 132*  --  132*  K 6.7* 5.2* 4.8 4.5 5.6* 4.8 4.5  CL 103 102 101 96* 96*  --  93*  CO2 22 21* 18*  --  23  --  27  GLUCOSE 82 101* 96 83 134*  --  93  BUN 52* 57* 59* 29* 35*  --  49*  CREATININE 10.56* 10.49* 10.61* 6.50* 8.04*  --  11.00*  CALCIUM 8.7* 7.9* 8.1*  --  8.6*  --  8.4*  MG  --   --   --   --   --   --  2.1  PHOS  --  6.1*  --   --  7.5*  --  8.5*   Liver Function Tests: Recent Labs  Lab 09/11/24 1939 09/12/24 0028 09/13/24 0250  AST 16  --  13*  ALT 15  --  9  ALKPHOS 72  --  50  BILITOT 0.4  --  0.3  PROT 9.3*  --  7.0  ALBUMIN  4.0 3.1* 2.9*   CBG:  No results for input(s): GLUCAP in the last 168 hours.  Scheduled Meds:  amLODipine   10 mg Oral q morning   Chlorhexidine  Gluconate Cloth  6 each Topical Q0600   Chlorhexidine  Gluconate Cloth  6 each Topical Q0600   heparin   5,000 Units Subcutaneous Q8H   hydroxychloroquine   200 mg Oral BH-q7a   labetalol   100 mg Oral BID   linezolid   600 mg Oral Q12H   montelukast   10 mg Oral Daily   sevelamer  carbonate  1,600 mg Oral TID WC   Continuous Infusions:  DAPTOmycin      PRN Meds:.acetaminophen  **OR** acetaminophen , albuterol , hydrALAZINE , labetalol , morphine  injection, ondansetron  **OR** ondansetron  (ZOFRAN ) IV, traZODone   Family Communication: None at bedside  Disposition: Status is: Inpatient Remains inpatient appropriate because: Staph bacteremia     Time spent: 39 minutes  Length of inpatient stay: 5 days  Author: Carliss LELON Canales, DO 09/17/2024 1:19 PM  For on call review [email or web address].

## 2024-09-18 DIAGNOSIS — T827XXD Infection and inflammatory reaction due to other cardiac and vascular devices, implants and grafts, subsequent encounter: Secondary | ICD-10-CM | POA: Diagnosis not present

## 2024-09-18 DIAGNOSIS — M329 Systemic lupus erythematosus, unspecified: Secondary | ICD-10-CM | POA: Diagnosis not present

## 2024-09-18 DIAGNOSIS — R7881 Bacteremia: Secondary | ICD-10-CM | POA: Diagnosis not present

## 2024-09-18 DIAGNOSIS — B957 Other staphylococcus as the cause of diseases classified elsewhere: Secondary | ICD-10-CM | POA: Diagnosis not present

## 2024-09-18 NOTE — Plan of Care (Signed)
  Problem: Education: Goal: Knowledge of disease and its progression will improve Outcome: Progressing Goal: Individualized Educational Video(s) Outcome: Not Applicable

## 2024-09-18 NOTE — Plan of Care (Signed)
  Problem: Clinical Measurements: Goal: Ability to avoid or minimize complications of infection will improve Outcome: Progressing   Problem: Skin Integrity: Goal: Skin integrity will improve Outcome: Progressing   Problem: Education: Goal: Knowledge of General Education information will improve Description: Including pain rating scale, medication(s)/side effects and non-pharmacologic comfort measures Outcome: Progressing   Problem: Health Behavior/Discharge Planning: Goal: Ability to manage health-related needs will improve Outcome: Progressing   Problem: Clinical Measurements: Goal: Ability to maintain clinical measurements within normal limits will improve Outcome: Progressing Goal: Will remain free from infection Outcome: Progressing Goal: Diagnostic test results will improve Outcome: Progressing Goal: Respiratory complications will improve Outcome: Progressing Goal: Cardiovascular complication will be avoided Outcome: Progressing   Problem: Activity: Goal: Risk for activity intolerance will decrease Outcome: Progressing   Problem: Nutrition: Goal: Adequate nutrition will be maintained Outcome: Progressing   Problem: Coping: Goal: Level of anxiety will decrease Outcome: Progressing   Problem: Elimination: Goal: Will not experience complications related to bowel motility Outcome: Progressing Goal: Will not experience complications related to urinary retention Outcome: Progressing   Problem: Pain Managment: Goal: General experience of comfort will improve and/or be controlled Outcome: Progressing   Problem: Safety: Goal: Ability to remain free from injury will improve Outcome: Progressing   Problem: Skin Integrity: Goal: Risk for impaired skin integrity will decrease Outcome: Progressing

## 2024-09-18 NOTE — Progress Notes (Signed)
 [facility] Infectious Diseases Follow Up Note  Patient Identification: Patient Name: Jorge [name] MRN: [id number] Admit Date: 09/11/2024  6:34 PM Age: 43 y.o.Today's Date: 09/18/2024  Reason for Visit: MRSE bacteremia  Principal Problem:   Staphylococcus epidermidis bacteremia Active Problems:   Hyperkalemia   Hypertensive urgency   ESRD on hemodialysis (HCC)   Right arm cellulitis   Arteriovenous graft infection   Antibiotics Daptomycin  12/10- Total days of antibiotics 8  Lines/Hardwares: Right IJ HD catheter   Interval Events: Remains afebrile Labs with WBC 5, hemoglobin 9.1  Assessment 43 year old male with prior history as below including asthma, HTN, GERD, SLE on hydroxychloroquine  who presented to the ED on 12/4 acute onset right arm swelling, warmth and pain at AV fistula site.  Admitted with   # MRSE bacteremia 2/2  # Infected AVG  - 12/5 s/p excision of right upper arm AV graft with drainage of seroma with vein patch angioplasty of right brachial artery with brachial vein.  Or cultures with MRSE, matching sensitivity with MRSE in blood.  Confirmed with Dr. Gretta, AVG is completely out, and nothing remaining  # SLE  - on hydroxychloroquine , low risk of immunosuppression  Recommendations - Continue daptomycin , plan for 6 weeks from date of negative blood cultures on 12/9.  Need to ensure HD has daptomycin  before discharge.  - Follow-up blood culture 12/9  to ensure negative for 72 hours before discharge - Can defer TEE as long as repeat blood cx 12/9 stay negative - Monitor CBC BMP and CPK - Universal/standard isolation precautions ID will so, recall with questions or concerns   OPAT  Diagnosis: Infected AV graft, bacteremia  Culture Result: MRSE  Allergies[1]  OPAT Orders Discharge antibiotics to be given via PICC line Discharge antibiotics: Daptomycin  to be given with HD, per pharmacy dosing Per  pharmacy protocol  Duration: 6 weeks  End Date: 10/28/24  [facility] Care Per Protocol:  Home health RN for IV administration and teaching; PICC line care and labs.    Labs weekly while on IV antibiotics: X__ CBC with differential X__ BMP __ CMP X__ CRP __ ESR __ Vancomycin trough X__ CK  X__ Please pull PIC at completion of IV antibiotics __ Please leave PIC in place until doctor has seen patient or been notified  Fax weekly labs to [phone]  Clinic Follow Up Appt: 12/29 at 2: 45 pm    Rest of the management as per the primary team. Thank you for the consult. Please page with pertinent questions or concerns.  ______________________________________________________________________ Subjective patient seen and examined at the bedside.  No complaints.  Discussed antibiotic plan.   Vitals BP (!) 162/136 (BP Location: Left Leg)   Pulse 83   Temp 98 F (36.7 C) (Oral)   Resp 15   Ht 5' 8 (1.727 m)   Wt 67.9 kg Comment: bed weight, pt refused to stand.  SpO2 100%   BMI 22.76 kg/m     Physical Exam Constitutional: Adult male sitting in the bed, nontoxic-appearing    Comments: HEENT WNL  Cardiovascular:     Rate and Rhythm: Normal rate     Heart sounds:   Pulmonary:     Effort: Pulmonary effort is normal.     Comments:   Abdominal:     Palpations: Abdomen is nondistended    Tenderness:   Musculoskeletal:        General: No swelling or tenderness in peripheral joints  Skin:    Comments: Right arm surgical site  okay with no signs of infection, right chest HD cath okay with no signs of infection  Neurological:     General: Awake, alert and oriented, grossly nonfocal  Psychiatric:        Mood and Affect: Mood normal.   Pertinent Microbiology Results for orders placed or performed during the hospital encounter of 09/11/24  Blood culture (routine x 2)     Status: Abnormal   Collection Time: 09/11/24  7:39 PM   Specimen: BLOOD LEFT HAND  Result Value Ref  Range Status   Specimen Description BLOOD LEFT HAND  Final   Special Requests   Final    BOTTLES DRAWN AEROBIC AND ANAEROBIC Blood Culture results may not be optimal due to an inadequate volume of blood received in culture bottles   Culture  Setup Time   Final    GRAM POSITIVE COCCI IN CLUSTERS IN BOTH AEROBIC AND ANAEROBIC BOTTLES CRITICAL RESULT CALLED TO, READ BACK BY AND VERIFIED WITH: PHARMD LYDIA CHEN ON 09/12/24 @ 1701 BY DRT Performed at [facility], [address] [place], [place] [address]    Culture STAPHYLOCOCCUS EPIDERMIDIS (A)  Final   Report Status 09/16/2024 FINAL  Final   Organism ID, Bacteria STAPHYLOCOCCUS EPIDERMIDIS  Final      Susceptibility   Staphylococcus epidermidis - MIC*    CIPROFLOXACIN <=0.5 SENSITIVE Sensitive     ERYTHROMYCIN <=0.25 SENSITIVE Sensitive     GENTAMICIN <=0.5 SENSITIVE Sensitive     OXACILLIN >=4 RESISTANT Resistant     TETRACYCLINE <=1 SENSITIVE Sensitive     VANCOMYCIN 1 SENSITIVE Sensitive     TRIMETH /SULFA  80 RESISTANT Resistant     CLINDAMYCIN <=0.25 SENSITIVE Sensitive     RIFAMPIN <=0.5 SENSITIVE Sensitive     Inducible Clindamycin NEGATIVE Sensitive     * STAPHYLOCOCCUS EPIDERMIDIS  Blood Culture ID Panel (Reflexed)     Status: Abnormal   Collection Time: 09/11/24  7:39 PM  Result Value Ref Range Status   Enterococcus faecalis NOT DETECTED NOT DETECTED Final   Enterococcus Faecium NOT DETECTED NOT DETECTED Final   Listeria monocytogenes NOT DETECTED NOT DETECTED Final   Staphylococcus species DETECTED (A) NOT DETECTED Final    Comment: CRITICAL RESULT CALLED TO, READ BACK BY AND VERIFIED WITH: PHARMD LYDIA CHEN ON 09/12/24 @ 1701 BY DRT    Staphylococcus aureus (BCID) NOT DETECTED NOT DETECTED Final   Staphylococcus epidermidis DETECTED (A) NOT DETECTED Final    Comment: Methicillin (oxacillin) resistant coagulase negative staphylococcus. Possible blood culture contaminant (unless isolated from more than one blood  culture draw or clinical case suggests pathogenicity). No antibiotic treatment is indicated for blood  culture contaminants. CRITICAL RESULT CALLED TO, READ BACK BY AND VERIFIED WITH: PHARMD LYDIA CHEN ON 09/12/24 @ 1701 BY DRT    Staphylococcus lugdunensis NOT DETECTED NOT DETECTED Final   Streptococcus species NOT DETECTED NOT DETECTED Final   Streptococcus agalactiae NOT DETECTED NOT DETECTED Final   Streptococcus pneumoniae NOT DETECTED NOT DETECTED Final   Streptococcus pyogenes NOT DETECTED NOT DETECTED Final   A.calcoaceticus-baumannii NOT DETECTED NOT DETECTED Final   Bacteroides fragilis NOT DETECTED NOT DETECTED Final   Enterobacterales NOT DETECTED NOT DETECTED Final   Enterobacter cloacae complex NOT DETECTED NOT DETECTED Final   Escherichia coli NOT DETECTED NOT DETECTED Final   Klebsiella aerogenes NOT DETECTED NOT DETECTED Final   Klebsiella oxytoca NOT DETECTED NOT DETECTED Final   Klebsiella pneumoniae NOT DETECTED NOT DETECTED Final   Proteus species NOT DETECTED NOT  DETECTED Final   Salmonella species NOT DETECTED NOT DETECTED Final   Serratia marcescens NOT DETECTED NOT DETECTED Final   Haemophilus influenzae NOT DETECTED NOT DETECTED Final   Neisseria meningitidis NOT DETECTED NOT DETECTED Final   Pseudomonas aeruginosa NOT DETECTED NOT DETECTED Final   Stenotrophomonas maltophilia NOT DETECTED NOT DETECTED Final   Candida albicans NOT DETECTED NOT DETECTED Final   Candida auris NOT DETECTED NOT DETECTED Final   Candida glabrata NOT DETECTED NOT DETECTED Final   Candida krusei NOT DETECTED NOT DETECTED Final   Candida parapsilosis NOT DETECTED NOT DETECTED Final   Candida tropicalis NOT DETECTED NOT DETECTED Final   Cryptococcus neoformans/gattii NOT DETECTED NOT DETECTED Final   Methicillin resistance mecA/C DETECTED (A) NOT DETECTED Final    Comment: CRITICAL RESULT CALLED TO, READ BACK BY AND VERIFIED WITH: PHARMD LYDIA CHEN ON 09/12/24 @ 1701 BY DRT Performed  at [facility], [address], [place], [place] [address]   Surgical pcr screen     Status: None   Collection Time: 09/12/24 12:59 PM   Specimen: Nasal Mucosa; Nasal Swab  Result Value Ref Range Status   MRSA, PCR NEGATIVE NEGATIVE Final   Staphylococcus aureus NEGATIVE NEGATIVE Final    Comment: (NOTE) The Xpert SA Assay (FDA approved for NASAL specimens in patients 75 years of age and older), is one component of a comprehensive surveillance program. It is not intended to diagnose infection nor to guide or monitor treatment. Performed at [facility], [address] [place], [place] [address]   Aerobic/Anaerobic Culture w Gram Stain (surgical/deep wound)     Status: None   Collection Time: 09/12/24  3:41 PM   Specimen: Path fluid; Body Fluid  Result Value Ref Range Status   Specimen Description WOUND RIGHT ARM  Final   Special Requests SWABS  Final   Gram Stain NO WBC SEEN NO ORGANISMS SEEN   Final   Culture   Final    ABUNDANT STAPHYLOCOCCUS EPIDERMIDIS NO ANAEROBES ISOLATED Performed at [facility], [address] [place], [place] [address]    Report Status 09/17/2024 FINAL  Final   Organism ID, Bacteria STAPHYLOCOCCUS EPIDERMIDIS  Final      Susceptibility   Staphylococcus epidermidis - MIC*    CIPROFLOXACIN <=0.5 SENSITIVE Sensitive     ERYTHROMYCIN <=0.25 SENSITIVE Sensitive     GENTAMICIN <=0.5 SENSITIVE Sensitive     OXACILLIN >=4 RESISTANT Resistant     TETRACYCLINE <=1 SENSITIVE Sensitive     VANCOMYCIN 1 SENSITIVE Sensitive     TRIMETH /SULFA  80 RESISTANT Resistant     CLINDAMYCIN <=0.25 SENSITIVE Sensitive     RIFAMPIN <=0.5 SENSITIVE Sensitive     Inducible Clindamycin NEGATIVE Sensitive     * ABUNDANT STAPHYLOCOCCUS EPIDERMIDIS  Culture, blood (Routine X 2) w Reflex to ID Panel     Status: None (Preliminary result)   Collection Time: 09/16/24  3:00 PM   Specimen: BLOOD  Result Value Ref Range Status   Specimen Description BLOOD SITE  NOT SPECIFIED  Final   Special Requests   Final    BOTTLES DRAWN AEROBIC AND ANAEROBIC Blood Culture adequate volume   Culture   Final    NO GROWTH 2 DAYS Performed at [facility], [address] [place], [place] [address]    Report Status PENDING  Incomplete  Culture, blood (Routine X 2) w Reflex to ID Panel     Status: None (Preliminary result)   Collection  Time: 09/16/24  3:12 PM   Specimen: BLOOD LEFT HAND  Result Value Ref Range Status   Specimen Description BLOOD LEFT HAND  Final   Special Requests   Final    BOTTLES DRAWN AEROBIC AND ANAEROBIC Blood Culture adequate volume   Culture   Final    NO GROWTH 2 DAYS Performed at [facility], [address] [place], [place] [address]    Report Status PENDING  Incomplete   Pertinent Lab.    Latest Ref Rng & Units 09/17/2024    8:25 AM 09/13/2024    2:50 AM 09/12/2024    1:40 PM  CBC  WBC 4.0 - 10.5 K/uL 5.0  4.6    Hemoglobin 13.0 - 17.0 g/dL 9.1  9.7  86.3   Hematocrit 39.0 - 52.0 % 29.3  30.2  40.0   Platelets 150 - 400 K/uL 234  158        Latest Ref Rng & Units 09/17/2024    2:25 AM 09/13/2024    2:11 PM 09/13/2024    2:50 AM  CMP  Glucose 70 - 99 mg/dL 93   865   BUN 6 - 20 mg/dL 49   35   Creatinine 9.38 - 1.24 mg/dL 88.99   1.95   Sodium 864 - 145 mmol/L 132   132   Potassium 3.5 - 5.1 mmol/L 4.5  4.8  5.6   Chloride 98 - 111 mmol/L 93   96   CO2 22 - 32 mmol/L 27   23   Calcium 8.9 - 10.3 mg/dL 8.4   8.6   Total Protein 6.5 - 8.1 g/dL   7.0   Total Bilirubin 0.0 - 1.2 mg/dL   0.3   Alkaline Phos 38 - 126 U/L   50   AST 15 - 41 U/L   13   ALT 0 - 44 U/L   9      Pertinent Imaging today Plain films and CT images have been personally visualized and interpreted; radiology reports have been reviewed. Decision making incorporated into the Impression /   ECHOCARDIOGRAM COMPLETE Result Date: 09/16/2024    ECHOCARDIOGRAM REPORT   Patient Name:   Jorge [name] Date of Exam: 09/16/2024 Medical Rec  #:  [id number]       Height:       68.0 in Accession #:    [id number]      Weight:       144.8 lb Date of Birth:  02-22-1981       BSA:          1.782 m Patient Age:    43 years        BP:           114/79 mmHg Patient Gender: M               HR:           80 bpm. Exam Location:  Inpatient Procedure: 2D Echo, Cardiac Doppler and Color Doppler (Both Spectral and Color            Flow Doppler were utilized during procedure). Indications:    Bacteremia  History:        Patient has no prior history of Echocardiogram examinations.                 Risk Factors:Hypertension.  Sonographer:    Philomena Daring Referring Phys: ANNALEE OREM IMPRESSIONS  1. Left ventricular ejection fraction, by estimation, is 60 to  65%. The left ventricle has normal function. The left ventricle has no regional wall motion abnormalities. There is mild concentric left ventricular hypertrophy. Left ventricular diastolic parameters were normal.  2. Right ventricular systolic function is normal. The right ventricular size is normal.  3. A small pericardial effusion is present. The pericardial effusion is circumferential.  4. The mitral valve is normal in structure. Trivial mitral valve regurgitation. No evidence of mitral stenosis.  5. The aortic valve is normal in structure. Aortic valve regurgitation is not visualized. No aortic stenosis is present.  6. The inferior vena cava is normal in size with greater than 50% respiratory variability, suggesting right atrial pressure of 3 mmHg. FINDINGS  Left Ventricle: Left ventricular ejection fraction, by estimation, is 60 to 65%. The left ventricle has normal function. The left ventricle has no regional wall motion abnormalities. The left ventricular internal cavity size was normal in size. There is  mild concentric left ventricular hypertrophy. Left ventricular diastolic parameters were normal. Right Ventricle: The right ventricular size is normal. No increase in right ventricular wall thickness. Right  ventricular systolic function is normal. Left Atrium: Left atrial size was normal in size. Right Atrium: Right atrial size was normal in size. Pericardium: A small pericardial effusion is present. The pericardial effusion is circumferential. Mitral Valve: The mitral valve is normal in structure. Trivial mitral valve regurgitation. No evidence of mitral valve stenosis. Tricuspid Valve: The tricuspid valve is normal in structure. Tricuspid valve regurgitation is not demonstrated. No evidence of tricuspid stenosis. Aortic Valve: The aortic valve is normal in structure. Aortic valve regurgitation is not visualized. No aortic stenosis is present. Aortic valve mean gradient measures 6.0 mmHg. Aortic valve peak gradient measures 11.8 mmHg. Aortic valve area, by VTI measures 1.70 cm. Pulmonic Valve: The pulmonic valve was normal in structure. Pulmonic valve regurgitation is not visualized. No evidence of pulmonic stenosis. Aorta: The aortic root is normal in size and structure. Venous: The inferior vena cava is normal in size with greater than 50% respiratory variability, suggesting right atrial pressure of 3 mmHg. IAS/Shunts: No atrial level shunt detected by color flow Doppler.  LEFT VENTRICLE PLAX 2D LVIDd:         4.00 cm   Diastology LVIDs:         2.60 cm   LV e' medial:    8.27 cm/s LV PW:         1.00 cm   LV E/e' medial:  11.1 LV IVS:        1.20 cm   LV e' lateral:   10.70 cm/s LVOT diam:     1.90 cm   LV E/e' lateral: 8.6 LV SV:         52 LV SV Index:   29 LVOT Area:     2.84 cm  RIGHT VENTRICLE             IVC RV Basal diam:  3.20 cm     IVC diam: 1.50 cm RV Mid diam:    2.80 cm RV S prime:     11.00 cm/s TAPSE (M-mode): 1.8 cm LEFT ATRIUM             Index        RIGHT ATRIUM           Index LA diam:        2.50 cm 1.40 cm/m   RA Area:     11.90 cm LA Vol (A2C):   39.4 ml 22.11  ml/m  RA Volume:   21.40 ml  12.01 ml/m LA Vol (A4C):   32.1 ml 18.02 ml/m LA Biplane Vol: 36.9 ml 20.71 ml/m  AORTIC VALVE AV  Area (Vmax):    1.83 cm AV Area (Vmean):   1.73 cm AV Area (VTI):     1.70 cm AV Vmax:           172.00 cm/s AV Vmean:          118.000 cm/s AV VTI:            0.308 m AV Peak Grad:      11.8 mmHg AV Mean Grad:      6.0 mmHg LVOT Vmax:         111.00 cm/s LVOT Vmean:        71.800 cm/s LVOT VTI:          0.185 m LVOT/AV VTI ratio: 0.60  AORTA Ao Root diam: 2.50 cm MITRAL VALVE                TRICUSPID VALVE MV Area (PHT): 4.57 cm     TR Peak grad:   21.0 mmHg MV Decel Time: 166 msec     TR Vmax:        229.00 cm/s MV E velocity: 91.70 cm/s MV A velocity: 102.00 cm/s  SHUNTS MV E/A ratio:  0.90         Systemic VTI:  0.18 m                             Systemic Diam: 1.90 cm Kardie Tobb DO Electronically signed by Dub Huntsman DO Signature Date/Time: 09/16/2024/4:20:40 PM    Final    UE VENOUS DUPLEX (7am - 7pm) Result Date: 09/14/2024 UPPER VENOUS STUDY  Patient Name:  Jorge [name]  Date of Exam:   09/14/2024 Medical Rec #: [id number]        Accession #:    [id number] Date of Birth: Nov 18, 1980        Patient Gender: M Patient Age:   71 years Exam Location:  [facility] Procedure:      VAS US  UPPER EXTREMITY VENOUS DUPLEX Referring Phys: [facility] --------------------------------------------------------------------------------  Indications: Swelling, and Pain Other Indications: Status post - EXCISION OF RIGHT UPPER ARM VEIN GRAFT 09/12/24. Limitations: Poor ultrasound/tissue interface and right upper arm painful to touch. Performing Technologist: Ricka Sturdivant-Jones RDMS, RVT  Examination Guidelines: A complete evaluation includes B-mode imaging, spectral Doppler, color Doppler, and power Doppler as needed of all accessible portions of each vessel. Bilateral testing is considered an integral part of a complete examination. Limited examinations for reoccurring indications may be performed as noted.  Right Findings: +----------+------------+---------+-----------+----------+--------------+ RIGHT      CompressiblePhasicitySpontaneousProperties   Summary     +----------+------------+---------+-----------+----------+--------------+ IJV           Full       Yes       Yes                             +----------+------------+---------+-----------+----------+--------------+ Subclavian               Yes       No               dampened flow  +----------+------------+---------+-----------+----------+--------------+ Axillary                 Yes  Yes                             +----------+------------+---------+-----------+----------+--------------+ Brachial                 Yes       Yes                             +----------+------------+---------+-----------+----------+--------------+ Radial        Full                                                 +----------+------------+---------+-----------+----------+--------------+ Ulnar         Full                                                 +----------+------------+---------+-----------+----------+--------------+ Cephalic      Full                                                 +----------+------------+---------+-----------+----------+--------------+ Basilic                                             Not visualized +----------+------------+---------+-----------+----------+--------------+ Right upper arm patent with color. Limted evaluation, too painful to compress veins. Right subclavian vein is patent with dampened continuous flow which may suggest a more proximal obstruction.  Left Findings: +----------+------------+---------+-----------+----------+-------+ LEFT      CompressiblePhasicitySpontaneousPropertiesSummary +----------+------------+---------+-----------+----------+-------+ Subclavian               Yes       Yes                      +----------+------------+---------+-----------+----------+-------+  Summary:  Right: The right upper extremity appears patent with color doppler with  no evidence of deep vein thrombosis in the veins that were clearly visualized.  Left: No evidence of thrombosis in the subclavian.  *See table(s) above for measurements and observations.  Diagnosing physician: Debby Robertson Electronically signed by Debby Robertson on 09/14/2024 at 8:39:11 PM.    Final    CT HUMERUS RIGHT W CONTRAST Result Date: 09/11/2024 CLINICAL DATA:  Soft tissue mass of upper arm. Recent graft placement. EXAM: CT OF THE UPPER RIGHT EXTREMITY WITH CONTRAST TECHNIQUE: Multidetector CT imaging of the upper right extremity was performed according to the standard protocol following intravenous contrast administration. RADIATION DOSE REDUCTION: This exam was performed according to the departmental dose-optimization program which includes automated exposure control, adjustment of the mA and/or kV according to patient size and/or use of iterative reconstruction technique. CONTRAST:  75mL OMNIPAQUE  IOHEXOL  350 MG/ML SOLN COMPARISON:  None Available. FINDINGS: There is no acute fracture or focal osseous lesion. There is no significant elbow or shoulder joint effusion. A graft is present connecting the left axillary vein to a vessel at the level of the distal humerus. Graft is grossly patent, but evaluation is limited secondary  to timing of the contrast bolus. There is mild subcutaneous stranding surrounding the entire graft. Adjacent to the distal 2 cm of the graft there is an enhancing fluid collection measuring 2.7 x 2.2 by 2.8 cm. This abuts the graft. There is no contrast opacification within this collection. There is surrounding inflammatory stranding. Multiple superficial collateral vessels are seen along the right anterior chest wall and within the proximal right arm. There is a right upper lobe nodule measuring 5 mm on image 4/50. IMPRESSION: 1. 2.8 cm enhancing fluid collection adjacent to the distal 2 cm of the graft in the right upper extremity. This is concerning for abscess. 2. Mild  subcutaneous stranding surrounding the entire graft. 3. 5 mm right upper lobe pulmonary nodule. No follow-up needed if patient is low-risk.This recommendation follows the consensus statement: Guidelines for Management of Incidental Pulmonary Nodules Detected on CT Images: From the Fleischner Society 2017; Radiology 2017; 284:228-243. Electronically Signed   By: Greig Pique M.D.   On: 09/11/2024 23:11   I spent 50 minutes involved in face-to-face and non-face-to-face activities for this patient on the day of the visit. Professional time spent includes the following activities: Preparing to see the patient (review of tests), Obtaining and reviewing separately obtained history (hospitalist progress note, nephrology), Performing a medically appropriate examination and evaluation, Ordering medications,  referring and communicating with other health care professionals, Documenting clinical information in the EMR, Independently interpreting results (not separately reported), Communicating results to the patient, Counseling and educating the patient and Care coordination (not separately reported).  Electronically signed by:   Plan d/w requesting provider as well as ID pharm D  Of note, portions of this note may have been created with voice recognition software. While this note has been edited for accuracy, occasional wrong-word or sound-a-like substitutions may have occurred due to the inherent limitations of voice recognition software.   Electronically signed by:   Annalee Orem, MD Infectious Disease Physician [facility] Pager: [phone]     [1]  Allergies Allergen Reactions   Vancomycin Hives   Bactoshield Chg [Chlorhexidine  Gluconate] Itching    Patient reports irritation w/ CHG, previously said scratched until bled on buttocks   Esomeprazole Rash

## 2024-09-18 NOTE — Progress Notes (Signed)
 Discussed with out-pt HD clinic [facility] regarding an update on the abx Daptomycin . Clinic stated although it has been ordered and discussed, their vendors are out of stock for this med at this time, they cannot give me an estimate on when they can get their hands on it. They are asking other clinics in the area if they have any at this time. Care team including Nephrology, attending, SW/CM, and pharmacy updated at this time. Will continue to assist.   Lavanda Fredrickson Dialysis Navigator [phone]

## 2024-09-18 NOTE — Progress Notes (Signed)
 Patient frustrated by CHG wipes offer; reports intense itching such that last time received, scratched to bleeding on buttocks.

## 2024-09-18 NOTE — Progress Notes (Signed)
 PROGRESS NOTE   Jorge [name]  FMW:[id number]    DOB: Oct 11, 1980    DOA: 09/11/2024  PCP: Austin Mutton, MD   I have briefly reviewed patients previous medical records in [facility].   Brief Hospital Course:  43 year old male with medical history significant for ESRD on hemodialysis MWF, asthma, hypertension, SLE on hydroxychloroquine , GERD, presented to the ED on 12//25 with complaints of acute onset of right arm swelling and pain at the site of his AV fistula which was not being used due to steal syndrome.  He had been getting hemodialysis through his right chest port.  CT imaging showed abscess.  Vascular surgery evaluated him for concern of infected right arm AV graft with abscess.  He underwent excision of right upper arm AV graft with drainage of seroma and vein patch angioplasty right brachial artery with brachial vein on 12/5.  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  ID consulted.  Nephrology on board for HD needs.   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   S/p excision of right upper arm AV graft with drainage of seroma and vein patch angioplasty right brachial artery with brachial vein on 12/5. OR AV graft fluid culture: MRSE.  Blood culture 12/4: MRSE. ID consultation 12/9 appreciated.  Discontinued cefepime  and started daptomycin .  Surveillance blood cultures from 12/9: Negative to date. 2D echo 12/9: LVEF 60-65%.  Small pericardial effusion that is circumferential no mention of vegetations.  Defer TEE decision to ID.  Per ID, okay to keep HD catheter as long as surveillance blood cultures are negative and TTE negative. As per nephrology team, they have not yet been able to source daptomycin  for outpatient use as there vendors are out of stock for this medication at this time and do not have an estimate  as to when they will get it. ID team aware.  ESRD on MWF HD Nephrology following for HD needs.  Last HD 12/10  Hyperkalemia Resolved after HD.  Anemia of ESRD Overall stable.  Per nephrology, no indication for erythropoietin.  Essential hypertension Controlled on amlodipine  10 mg daily and labetalol  100 mg twice daily.  Clinically euvolemic.  SLE No acute flare.  Continue Plaquenil .  Body mass index is 22.76 kg/m.   DVT prophylaxis: heparin  injection 5,000 Units Start: 09/13/24 0600     Code Status: Full Code:  Family Communication: None at bedside Disposition:  Status is: Inpatient Remains inpatient appropriate because: Pending surveillance cultures continuing to be negative and ID clearance.     Consultants:   Vascular surgery Nephrology Infectious disease  Procedures:   As noted above.  Subjective:  Denies complaints.  Has appropriate soreness of right upper arm where he had his surgery.  Objective:   Vitals:   09/18/24 0315 09/18/24 0718 09/18/24 1026 09/18/24 1202  BP: 117/79 127/79 127/79 (!) 162/136  Pulse:      Resp: 15 11  15   Temp:      TempSrc: Oral     SpO2: 100%     Weight:      Height:        General exam: Young male, moderately built and thinly nourished sitting up eating breakfast.  Did not appear in any distress. Respiratory system: Clear to auscultation. Respiratory effort normal.  Right upper anterior chest HD catheter site  without acute findings. Cardiovascular system: S1 & S2 heard, RRR. No JVD, murmurs, rubs, gallops or clicks. No pedal edema.  Telemetry personally reviewed: Sinus rhythm.  Do not see need for continued telemetry, discontinued. Gastrointestinal system: Abdomen is nondistended, soft and nontender. No organomegaly or masses felt. Normal bowel sounds heard. Central nervous system: Alert and oriented. No focal neurological deficits. Extremities: Symmetric 5 x 5 power.  Right upper arm surgical site without acute  findings. Skin: No rashes, lesions or ulcers Psychiatry: Judgement and insight appear normal. Mood & affect appropriate.     Data Reviewed:   I have personally reviewed following labs and imaging studies   CBC: Recent Labs  Lab 09/11/24 1939 09/12/24 0027 09/12/24 0455 09/12/24 1340 09/13/24 0250 09/17/24 0825  WBC 6.3   < > 5.4  --  4.6 5.0  NEUTROABS 4.0  --   --   --   --   --   HGB 13.4   < > 10.9* 13.6 9.7* 9.1*  HCT 43.4   < > 36.1* 40.0 30.2* 29.3*  MCV 88.2   < > 90.3  --  86.5 88.3  PLT 197   < > 176  --  158 234   < > = values in this interval not displayed.    Basic Metabolic Panel: Recent Labs  Lab 09/11/24 1939 09/12/24 0028 09/12/24 0455 09/12/24 1340 09/13/24 0250 09/13/24 1411 09/17/24 0225  NA 139 138 138 136 132*  --  132*  K 6.7* 5.2* 4.8 4.5 5.6* 4.8 4.5  CL 103 102 101 96* 96*  --  93*  CO2 22 21* 18*  --  23  --  27  GLUCOSE 82 101* 96 83 134*  --  93  BUN 52* 57* 59* 29* 35*  --  49*  CREATININE 10.56* 10.49* 10.61* 6.50* 8.04*  --  11.00*  CALCIUM 8.7* 7.9* 8.1*  --  8.6*  --  8.4*  MG  --   --   --   --   --   --  2.1  PHOS  --  6.1*  --   --  7.5*  --  8.5*    Liver Function Tests: Recent Labs  Lab 09/11/24 1939 09/12/24 0028 09/13/24 0250  AST 16  --  13*  ALT 15  --  9  ALKPHOS 72  --  50  BILITOT 0.4  --  0.3  PROT 9.3*  --  7.0  ALBUMIN  4.0 3.1* 2.9*    CBG: No results for input(s): GLUCAP in the last 168 hours.  Microbiology Studies:   Recent Results (from the past 240 hours)  Blood culture (routine x 2)     Status: Abnormal   Collection Time: 09/11/24  7:39 PM   Specimen: BLOOD LEFT HAND  Result Value Ref Range Status   Specimen Description BLOOD LEFT HAND  Final   Special Requests   Final    BOTTLES DRAWN AEROBIC AND ANAEROBIC Blood Culture results may not be optimal due to an inadequate volume of blood received in culture bottles   Culture  Setup Time   Final    GRAM POSITIVE COCCI IN CLUSTERS IN BOTH  AEROBIC AND ANAEROBIC BOTTLES CRITICAL RESULT CALLED TO, READ BACK BY AND VERIFIED WITH: PHARMD LYDIA CHEN ON 09/12/24 @ 1701 BY DRT Performed at [facility], [address], [place], [place] [address]    Culture STAPHYLOCOCCUS EPIDERMIDIS (A)  Final   Report Status 09/16/2024 FINAL  Final   Organism ID, Bacteria STAPHYLOCOCCUS EPIDERMIDIS  Final      Susceptibility   Staphylococcus epidermidis - MIC*    CIPROFLOXACIN <=0.5 SENSITIVE Sensitive     ERYTHROMYCIN <=0.25 SENSITIVE Sensitive     GENTAMICIN <=0.5 SENSITIVE Sensitive     OXACILLIN >=4 RESISTANT Resistant     TETRACYCLINE <=1 SENSITIVE Sensitive     VANCOMYCIN 1 SENSITIVE Sensitive     TRIMETH /SULFA  80 RESISTANT Resistant     CLINDAMYCIN <=0.25 SENSITIVE Sensitive     RIFAMPIN <=0.5 SENSITIVE Sensitive     Inducible Clindamycin NEGATIVE Sensitive     * STAPHYLOCOCCUS EPIDERMIDIS  Blood Culture ID Panel (Reflexed)     Status: Abnormal   Collection Time: 09/11/24  7:39 PM  Result Value Ref Range Status   Enterococcus faecalis NOT DETECTED NOT DETECTED Final   Enterococcus Faecium NOT DETECTED NOT DETECTED Final   Listeria monocytogenes NOT DETECTED NOT DETECTED Final   Staphylococcus species DETECTED (A) NOT DETECTED Final    Comment: CRITICAL RESULT CALLED TO, READ BACK BY AND VERIFIED WITH: PHARMD LYDIA CHEN ON 09/12/24 @ 1701 BY DRT    Staphylococcus aureus (BCID) NOT DETECTED NOT DETECTED Final   Staphylococcus epidermidis DETECTED (A) NOT DETECTED Final    Comment: Methicillin (oxacillin) resistant coagulase negative staphylococcus. Possible blood culture contaminant (unless isolated from more than one blood culture draw or clinical case suggests pathogenicity). No antibiotic treatment is indicated for blood  culture contaminants. CRITICAL RESULT CALLED TO, READ BACK BY AND VERIFIED WITH: PHARMD LYDIA CHEN ON 09/12/24 @ 1701 BY DRT    Staphylococcus lugdunensis NOT DETECTED NOT DETECTED Final   Streptococcus  species NOT DETECTED NOT DETECTED Final   Streptococcus agalactiae NOT DETECTED NOT DETECTED Final   Streptococcus pneumoniae NOT DETECTED NOT DETECTED Final   Streptococcus pyogenes NOT DETECTED NOT DETECTED Final   A.calcoaceticus-baumannii NOT DETECTED NOT DETECTED Final   Bacteroides fragilis NOT DETECTED NOT DETECTED Final   Enterobacterales NOT DETECTED NOT DETECTED Final   Enterobacter cloacae complex NOT DETECTED NOT DETECTED Final   Escherichia coli NOT DETECTED NOT DETECTED Final   Klebsiella aerogenes NOT DETECTED NOT DETECTED Final   Klebsiella oxytoca NOT DETECTED NOT DETECTED Final   Klebsiella pneumoniae NOT DETECTED NOT DETECTED Final   Proteus species NOT DETECTED NOT DETECTED Final   Salmonella species NOT DETECTED NOT DETECTED Final   Serratia marcescens NOT DETECTED NOT DETECTED Final   Haemophilus influenzae NOT DETECTED NOT DETECTED Final   Neisseria meningitidis NOT DETECTED NOT DETECTED Final   Pseudomonas aeruginosa NOT DETECTED NOT DETECTED Final   Stenotrophomonas maltophilia NOT DETECTED NOT DETECTED Final   Candida albicans NOT DETECTED NOT DETECTED Final   Candida auris NOT DETECTED NOT DETECTED Final   Candida glabrata NOT DETECTED NOT DETECTED Final   Candida krusei NOT DETECTED NOT DETECTED Final   Candida parapsilosis NOT DETECTED NOT DETECTED Final   Candida tropicalis NOT DETECTED NOT DETECTED Final   Cryptococcus neoformans/gattii NOT DETECTED NOT DETECTED Final   Methicillin resistance mecA/C DETECTED (A) NOT DETECTED Final    Comment: CRITICAL RESULT CALLED TO, READ BACK BY AND VERIFIED WITH: PHARMD LYDIA CHEN ON 09/12/24 @ 1701 BY DRT Performed at [facility], [address], [place], [place] [address]   Surgical pcr screen     Status: None   Collection Time: 09/12/24 12:59 PM   Specimen: Nasal Mucosa; Nasal Swab  Result Value Ref Range Status   MRSA, PCR NEGATIVE NEGATIVE Final  Staphylococcus aureus NEGATIVE NEGATIVE Final     Comment: (NOTE) The Xpert SA Assay (FDA approved for NASAL specimens in patients 68 years of age and older), is one component of a comprehensive surveillance program. It is not intended to diagnose infection nor to guide or monitor treatment. Performed at [facility], [address], [place], [place] [address]   Aerobic/Anaerobic Culture w Gram Stain (surgical/deep wound)     Status: None   Collection Time: 09/12/24  3:41 PM   Specimen: Path fluid; Body Fluid  Result Value Ref Range Status   Specimen Description WOUND RIGHT ARM  Final   Special Requests SWABS  Final   Gram Stain NO WBC SEEN NO ORGANISMS SEEN   Final   Culture   Final    ABUNDANT STAPHYLOCOCCUS EPIDERMIDIS NO ANAEROBES ISOLATED Performed at [facility], [address] [place], [place] [address]    Report Status 09/17/2024 FINAL  Final   Organism ID, Bacteria STAPHYLOCOCCUS EPIDERMIDIS  Final      Susceptibility   Staphylococcus epidermidis - MIC*    CIPROFLOXACIN <=0.5 SENSITIVE Sensitive     ERYTHROMYCIN <=0.25 SENSITIVE Sensitive     GENTAMICIN <=0.5 SENSITIVE Sensitive     OXACILLIN >=4 RESISTANT Resistant     TETRACYCLINE <=1 SENSITIVE Sensitive     VANCOMYCIN 1 SENSITIVE Sensitive     TRIMETH /SULFA  80 RESISTANT Resistant     CLINDAMYCIN <=0.25 SENSITIVE Sensitive     RIFAMPIN <=0.5 SENSITIVE Sensitive     Inducible Clindamycin NEGATIVE Sensitive     * ABUNDANT STAPHYLOCOCCUS EPIDERMIDIS  Culture, blood (Routine X 2) w Reflex to ID Panel     Status: None (Preliminary result)   Collection Time: 09/16/24  3:00 PM   Specimen: BLOOD  Result Value Ref Range Status   Specimen Description BLOOD SITE NOT SPECIFIED  Final   Special Requests   Final    BOTTLES DRAWN AEROBIC AND ANAEROBIC Blood Culture adequate volume   Culture   Final    NO GROWTH 2 DAYS Performed at [facility], [address], [place], [place] [address]    Report Status PENDING  Incomplete  Culture, blood (Routine X  2) w Reflex to ID Panel     Status: None (Preliminary result)   Collection Time: 09/16/24  3:12 PM   Specimen: BLOOD LEFT HAND  Result Value Ref Range Status   Specimen Description BLOOD LEFT HAND  Final   Special Requests   Final    BOTTLES DRAWN AEROBIC AND ANAEROBIC Blood Culture adequate volume   Culture   Final    NO GROWTH 2 DAYS Performed at [facility], [address], [place], [place] [address]    Report Status PENDING  Incomplete    Radiology Studies:  ECHOCARDIOGRAM COMPLETE Result Date: 09/16/2024    ECHOCARDIOGRAM REPORT   Patient Name:   Jorge [name] Date of Exam: 09/16/2024 Medical Rec #:  [id number]       Height:       68.0 in Accession #:    [id number]      Weight:       144.8 lb Date of Birth:  24-Oct-1980       BSA:          1.782 m Patient Age:    43 years        BP:           114/79 mmHg Patient Gender: M  HR:           80 bpm. Exam Location:  Inpatient Procedure: 2D Echo, Cardiac Doppler and Color Doppler (Both Spectral and Color            Flow Doppler were utilized during procedure). Indications:    Bacteremia  History:        Patient has no prior history of Echocardiogram examinations.                 Risk Factors:Hypertension.  Sonographer:    Philomena Daring Referring Phys: ANNALEE OREM IMPRESSIONS  1. Left ventricular ejection fraction, by estimation, is 60 to 65%. The left ventricle has normal function. The left ventricle has no regional wall motion abnormalities. There is mild concentric left ventricular hypertrophy. Left ventricular diastolic parameters were normal.  2. Right ventricular systolic function is normal. The right ventricular size is normal.  3. A small pericardial effusion is present. The pericardial effusion is circumferential.  4. The mitral valve is normal in structure. Trivial mitral valve regurgitation. No evidence of mitral stenosis.  5. The aortic valve is normal in structure. Aortic valve regurgitation is not visualized. No  aortic stenosis is present.  6. The inferior vena cava is normal in size with greater than 50% respiratory variability, suggesting right atrial pressure of 3 mmHg. FINDINGS  Left Ventricle: Left ventricular ejection fraction, by estimation, is 60 to 65%. The left ventricle has normal function. The left ventricle has no regional wall motion abnormalities. The left ventricular internal cavity size was normal in size. There is  mild concentric left ventricular hypertrophy. Left ventricular diastolic parameters were normal. Right Ventricle: The right ventricular size is normal. No increase in right ventricular wall thickness. Right ventricular systolic function is normal. Left Atrium: Left atrial size was normal in size. Right Atrium: Right atrial size was normal in size. Pericardium: A small pericardial effusion is present. The pericardial effusion is circumferential. Mitral Valve: The mitral valve is normal in structure. Trivial mitral valve regurgitation. No evidence of mitral valve stenosis. Tricuspid Valve: The tricuspid valve is normal in structure. Tricuspid valve regurgitation is not demonstrated. No evidence of tricuspid stenosis. Aortic Valve: The aortic valve is normal in structure. Aortic valve regurgitation is not visualized. No aortic stenosis is present. Aortic valve mean gradient measures 6.0 mmHg. Aortic valve peak gradient measures 11.8 mmHg. Aortic valve area, by VTI measures 1.70 cm. Pulmonic Valve: The pulmonic valve was normal in structure. Pulmonic valve regurgitation is not visualized. No evidence of pulmonic stenosis. Aorta: The aortic root is normal in size and structure. Venous: The inferior vena cava is normal in size with greater than 50% respiratory variability, suggesting right atrial pressure of 3 mmHg. IAS/Shunts: No atrial level shunt detected by color flow Doppler.  LEFT VENTRICLE PLAX 2D LVIDd:         4.00 cm   Diastology LVIDs:         2.60 cm   LV e' medial:    8.27 cm/s LV PW:          1.00 cm   LV E/e' medial:  11.1 LV IVS:        1.20 cm   LV e' lateral:   10.70 cm/s LVOT diam:     1.90 cm   LV E/e' lateral: 8.6 LV SV:         52 LV SV Index:   29 LVOT Area:     2.84 cm  RIGHT VENTRICLE  IVC RV Basal diam:  3.20 cm     IVC diam: 1.50 cm RV Mid diam:    2.80 cm RV S prime:     11.00 cm/s TAPSE (M-mode): 1.8 cm LEFT ATRIUM             Index        RIGHT ATRIUM           Index LA diam:        2.50 cm 1.40 cm/m   RA Area:     11.90 cm LA Vol (A2C):   39.4 ml 22.11 ml/m  RA Volume:   21.40 ml  12.01 ml/m LA Vol (A4C):   32.1 ml 18.02 ml/m LA Biplane Vol: 36.9 ml 20.71 ml/m  AORTIC VALVE AV Area (Vmax):    1.83 cm AV Area (Vmean):   1.73 cm AV Area (VTI):     1.70 cm AV Vmax:           172.00 cm/s AV Vmean:          118.000 cm/s AV VTI:            0.308 m AV Peak Grad:      11.8 mmHg AV Mean Grad:      6.0 mmHg LVOT Vmax:         111.00 cm/s LVOT Vmean:        71.800 cm/s LVOT VTI:          0.185 m LVOT/AV VTI ratio: 0.60  AORTA Ao Root diam: 2.50 cm MITRAL VALVE                TRICUSPID VALVE MV Area (PHT): 4.57 cm     TR Peak grad:   21.0 mmHg MV Decel Time: 166 msec     TR Vmax:        229.00 cm/s MV E velocity: 91.70 cm/s MV A velocity: 102.00 cm/s  SHUNTS MV E/A ratio:  0.90         Systemic VTI:  0.18 m                             Systemic Diam: 1.90 cm Kardie Tobb DO Electronically signed by Kardie Tobb DO Signature Date/Time: 09/16/2024/4:20:40 PM    Final     Scheduled Meds:    amLODipine   10 mg Oral q morning   Chlorhexidine  Gluconate Cloth  6 each Topical Q0600   Chlorhexidine  Gluconate Cloth  6 each Topical Q0600   heparin   5,000 Units Subcutaneous Q8H   hydroxychloroquine   200 mg Oral BH-q7a   labetalol   100 mg Oral BID   montelukast   10 mg Oral Daily   sevelamer  carbonate  1,600 mg Oral TID WC    Continuous Infusions:    DAPTOmycin  Stopped (09/17/24 1909)     LOS: 6 days     Trenda Mar, MD,  FACP, [facility], [facility], [facility]   [facility]  Hospitalist & Physician Advisor [place]      To contact the attending provider between 7A-7P or the covering provider during after hours 7P-7A, please log into the web site [email or web address] and access using universal [place] password for that web site. If you do not have the password, please call the hospital operator.  09/18/2024, 12:08 PM

## 2024-09-18 NOTE — Progress Notes (Signed)
°  [facility] Progress Note   Subjective: Seen in room No c/o's today  Objective Vitals:   09/18/24 0315 09/18/24 0718 09/18/24 1026 09/18/24 1202  BP: 117/79 127/79 127/79 (!) 162/136  Pulse:      Resp: 15 11  15   Temp:      TempSrc: Oral     SpO2: 100%     Weight:      Height:       Physical Exam General:alert, well appearing male in NAD Heart:RRR, no mrg Lungs:CTAB, nml WOB on RA Abdomen:soft , NTND  Extremities:no LE edema, Right arm dressed in ACE wrap Dialysis Access: [facility]    OP HD: [facility] MWF [address]   [facility]   Assessment/Plan: 1. AVG infection/ MRSE bacteremia - excision of RU AVG and seroma with vein patch angioplasty by Dr. Gretta.  Blood cx's were 2/2+ for MRSE. Seen by ID. IV abx changed to daptomycin , TTE ordered. Repeat blood cx's neg to date. Pt's HD unit stated today that their vendor is out of daptomycin . They are calling other units to see if they have extra.  2. ESRD -on HD MWF.  HD Friday.  3. Hyperkalemia - get labs in the am 4. Anemia of CKD- Hgb 9-10 range.  No indication for ESA at this time.   5. HTN/volume - Blood pressure in goal. Continue home meds. Low UF goal today.  6. Nutrition -  Renal diet w/fluid restrictions. Reviewed low K diet.   7. SLE - on plaquenil .    Myer Fret  MD  [facility] 09/18/2024, 1:19 PM  Recent Labs  Lab 09/12/24 0028 09/12/24 0455 09/13/24 0250 09/13/24 1411 09/17/24 0225 09/17/24 0825  HGB  --    < > 9.7*  --   --  9.1*  ALBUMIN  3.1*  --  2.9*  --   --   --   CALCIUM 7.9*   < > 8.6*  --  8.4*  --   PHOS 6.1*  --  7.5*  --  8.5*  --   CREATININE 10.49*   < > 8.04*  --  11.00*  --   K 5.2*   < > 5.6* 4.8 4.5  --    < > = values in this interval not displayed.    Inpatient medications:  amLODipine   10 mg Oral q morning   Chlorhexidine  Gluconate Cloth  6 each Topical Q0600   Chlorhexidine  Gluconate Cloth  6 each Topical Q0600   heparin   5,000 Units Subcutaneous Q8H   hydroxychloroquine   200 mg  Oral BH-q7a   labetalol   100 mg Oral BID   montelukast   10 mg Oral Daily   sevelamer  carbonate  1,600 mg Oral TID WC    DAPTOmycin  Stopped (09/17/24 1909)   acetaminophen  **OR** acetaminophen , albuterol , hydrALAZINE , morphine  injection, ondansetron  **OR** ondansetron  (ZOFRAN ) IV, traZODone

## 2024-09-19 LAB — CBC
HCT: 29.4 % — ABNORMAL LOW (ref 39.0–52.0)
HCT: 28.5 % — ABNORMAL LOW (ref 39.0–52.0)
Hemoglobin: 9.4 g/dL — ABNORMAL LOW (ref 13.0–17.0)
Hemoglobin: 8.9 g/dL — ABNORMAL LOW (ref 13.0–17.0)
MCH: 28 pg (ref 26.0–34.0)
MCH: 27.6 pg (ref 26.0–34.0)
MCHC: 32 g/dL (ref 30.0–36.0)
MCHC: 31.2 g/dL (ref 30.0–36.0)
MCV: 87.5 fL (ref 80.0–100.0)
MCV: 88.2 fL (ref 80.0–100.0)
Platelets: 217 K/uL (ref 150–400)
Platelets: 222 K/uL (ref 150–400)
RBC: 3.36 MIL/uL — ABNORMAL LOW (ref 4.22–5.81)
RBC: 3.23 MIL/uL — ABNORMAL LOW (ref 4.22–5.81)
RDW: 13.1 % (ref 11.5–15.5)
RDW: 13.1 % (ref 11.5–15.5)
WBC: 5.2 K/uL (ref 4.0–10.5)
WBC: 5.1 K/uL (ref 4.0–10.5)
nRBC: 0 % (ref 0.0–0.2)
nRBC: 0 % (ref 0.0–0.2)

## 2024-09-19 LAB — RENAL FUNCTION PANEL
Albumin: 3 g/dL — ABNORMAL LOW (ref 3.5–5.0)
Anion gap: 11 (ref 5–15)
BUN: 50 mg/dL — ABNORMAL HIGH (ref 6–20)
CO2: 28 mmol/L (ref 22–32)
Calcium: 8.7 mg/dL — ABNORMAL LOW (ref 8.9–10.3)
Chloride: 94 mmol/L — ABNORMAL LOW (ref 98–111)
Creatinine, Ser: 10.58 mg/dL — ABNORMAL HIGH (ref 0.61–1.24)
GFR, Estimated: 6 mL/min — ABNORMAL LOW (ref >60)
Glucose, Bld: 119 mg/dL — ABNORMAL HIGH (ref 70–99)
Phosphorus: 8.5 mg/dL — ABNORMAL HIGH (ref 2.5–4.6)
Potassium: 4.8 mmol/L (ref 3.5–5.1)
Sodium: 133 mmol/L — ABNORMAL LOW (ref 135–145)

## 2024-09-19 MED ORDER — HEPARIN SODIUM (PORCINE) 1000 UNIT/ML DIALYSIS
2500.0000 [IU] | Freq: Once | INTRAMUSCULAR | Status: AC
  Administered 2024-09-19: 2500 [IU] via INTRAVENOUS_CENTRAL

## 2024-09-19 MED ORDER — DARBEPOETIN ALFA 40 MCG/0.4ML IJ SOSY
40.0000 ug | PREFILLED_SYRINGE | INTRAMUSCULAR | Status: DC
  Administered 2024-09-19: 40 ug via SUBCUTANEOUS
  Filled 2024-09-19 (×2): qty 0.4

## 2024-09-19 MED ORDER — HEPARIN SODIUM (PORCINE) 1000 UNIT/ML DIALYSIS
1000.0000 [IU] | INTRAMUSCULAR | Status: DC | PRN
  Administered 2024-09-19: 3200 [IU]

## 2024-09-19 MED ORDER — NEPRO/CARBSTEADY PO LIQD: 237.0000 mL | ORAL | Status: DC | PRN

## 2024-09-19 MED ORDER — HEPARIN SODIUM (PORCINE) 1000 UNIT/ML IJ SOLN
INTRAMUSCULAR | Status: AC
  Filled 2024-09-19: qty 4

## 2024-09-19 MED ORDER — LIDOCAINE HCL (PF) 1 % IJ SOLN: 5.0000 mL | INTRAMUSCULAR | Status: DC | PRN

## 2024-09-19 MED ORDER — PENTAFLUOROPROP-TETRAFLUOROETH EX AERO: 1.0000 | INHALATION_SPRAY | CUTANEOUS | Status: DC | PRN

## 2024-09-19 MED ORDER — HEPARIN SODIUM (PORCINE) 1000 UNIT/ML IJ SOLN
INTRAMUSCULAR | Status: AC
  Filled 2024-09-19: qty 2

## 2024-09-19 MED ORDER — ANTICOAGULANT SODIUM CITRATE 4% (200MG/5ML) IV SOLN: 5.0000 mL | Status: DC | PRN

## 2024-09-19 MED ORDER — LIDOCAINE-PRILOCAINE 2.5-2.5 % EX CREA: 1.0000 | TOPICAL_CREAM | CUTANEOUS | Status: DC | PRN

## 2024-09-19 MED ORDER — ALTEPLASE 2 MG IJ SOLR: 2.0000 mg | Freq: Once | INTRAMUSCULAR | Status: DC | PRN

## 2024-09-19 MED ORDER — HEPARIN SODIUM (PORCINE) 1000 UNIT/ML IJ SOLN
3200.0000 [IU] | Freq: Once | INTRAMUSCULAR | Status: AC
  Administered 2024-09-19: 3200 [IU] via INTRAVENOUS

## 2024-09-19 MED ORDER — HEPARIN SODIUM (PORCINE) 1000 UNIT/ML IJ SOLN
INTRAMUSCULAR | Status: AC
  Filled 2024-09-19: qty 3

## 2024-09-19 MED ORDER — HEPARIN SODIUM (PORCINE) 1000 UNIT/ML DIALYSIS
1500.0000 [IU] | INTRAMUSCULAR | Status: AC | PRN
  Administered 2024-09-19: 1500 [IU] via INTRAVENOUS_CENTRAL

## 2024-09-19 NOTE — Plan of Care (Signed)
 ID brief note   Afebrile 12/9 blood cx NG in 3 days   Per d/w ID pharmacy, issues with obtaining daptomycin  at the HD center and they are checking to see if other options available for purchase daptomycin   Patient has h/o Vancomycin allergy with hives and severe itching and seems reluctant to trial  TEE has been scheduled for  12/16 just in case not able to get Daptomycin  at the HD center to complete full 6 weeks course and pushed to think for alternative options like early po switch  If able to get full 6 weeks course of daptomycin  at the HD center and blood cultures stay 12/9  negative, can cancel TEE from ID standpoint   D/w primary team and ID pharmacy   Dr Eben covering this weekend, Dr Dennise starting Monday.   Annalee Orem, MD Infectious Disease Physician [facility] [address] [place], [place] [address] Phone: [phone]  Fax: [phone]

## 2024-09-19 NOTE — Progress Notes (Addendum)
 Contacted by pharmacy regarding update of iv abx at clinic. Contacted clinic manager, Marcey, at [facility] who told me they have been asking around like they said, however, they found enough for only 4 doses, not 12 as needed. There is a sport and exercise psychologist per [employer]. This clinic stated they will not be able to accommodate and asking if it can be changed to anything else. This is not a [facility] clinic, so I can not escalate any further. Care team informed.   Lysa Livengood Dialysis Navigator [phone]

## 2024-09-19 NOTE — Plan of Care (Signed)
°  Problem: Clinical Measurements: Goal: Ability to avoid or minimize complications of infection will improve Outcome: Progressing   Problem: Skin Integrity: Goal: Skin integrity will improve Outcome: Progressing   Problem: Education: Goal: Knowledge of General Education information will improve Description: Including pain rating scale, medication(s)/side effects and non-pharmacologic comfort measures Outcome: Progressing   Problem: Health Behavior/Discharge Planning: Goal: Ability to manage health-related needs will improve Outcome: Progressing   Problem: Clinical Measurements: Goal: Ability to maintain clinical measurements within normal limits will improve Outcome: Progressing Goal: Will remain free from infection Outcome: Progressing Goal: Diagnostic test results will improve Outcome: Progressing Goal: Respiratory complications will improve Outcome: Progressing Goal: Cardiovascular complication will be avoided Outcome: Progressing   Problem: Activity: Goal: Risk for activity intolerance will decrease Outcome: Progressing   Problem: Nutrition: Goal: Adequate nutrition will be maintained Outcome: Progressing   Problem: Coping: Goal: Level of anxiety will decrease Outcome: Progressing   Problem: Elimination: Goal: Will not experience complications related to bowel motility Outcome: Progressing Goal: Will not experience complications related to urinary retention Outcome: Progressing   Problem: Pain Managment: Goal: General experience of comfort will improve and/or be controlled Outcome: Progressing   Problem: Safety: Goal: Ability to remain free from injury will improve Outcome: Progressing   Problem: Skin Integrity: Goal: Risk for impaired skin integrity will decrease Outcome: Progressing   Problem: Education: Goal: Knowledge of disease and its progression will improve Outcome: Progressing   Problem: Fluid Volume: Goal: Compliance with measures to  maintain balanced fluid volume will improve Outcome: Progressing   Problem: Health Behavior/Discharge Planning: Goal: Ability to manage health-related needs will improve Outcome: Progressing   Problem: Nutritional: Goal: Ability to make healthy dietary choices will improve Outcome: Progressing   Problem: Clinical Measurements: Goal: Complications related to the disease process, condition or treatment will be avoided or minimized Outcome: Progressing

## 2024-09-19 NOTE — Progress Notes (Signed)
 PROGRESS NOTE   Jorge [name]  FMW:[id number]    DOB: Jun 10, 1981    DOA: 09/11/2024  PCP: Austin Mutton, MD   I have briefly reviewed patients previous medical records in [facility].   Brief Hospital Course:  43 year old male with medical history significant for ESRD on hemodialysis MWF, asthma, hypertension, SLE on hydroxychloroquine , GERD, presented to the ED on 12//25 with complaints of acute onset of right arm swelling and pain at the site of his AV fistula which was not being used due to steal syndrome.  He had been getting hemodialysis through his right chest port.  CT imaging showed abscess.  Vascular surgery evaluated him for concern of infected right arm AV graft with abscess.  He underwent excision of right upper arm AV graft with drainage of seroma and vein patch angioplasty right brachial artery with brachial vein on 12/5.  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  ID consulted.  Nephrology on board for HD needs.   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   S/p excision of right upper arm AV graft with drainage of seroma and vein patch angioplasty right brachial artery with brachial vein on 12/5. OR AV graft fluid culture: MRSE.  Blood culture 12/4: MRSE. ID consultation 12/9 appreciated.  Discontinued cefepime  and started daptomycin .  Surveillance blood cultures from 12/9: Negative to date after 3 days. 2D echo 12/9: LVEF 60-65%.  Small pericardial effusion that is circumferential no mention of vegetations.   As per ID follow-up and signoff note 12/12: Continue daptomycin  across HD x 6 weeks (refer to OPAT orders with appropriate labs from 12/11) with end date of 10/28/2024, okay to defer TEE as long as surveillance blood cultures from 12/9 stay negative and has outpatient follow-up with ID on 12/29 at 2:45  PM. As per nephrology team, they have not yet been able to source daptomycin  for outpatient use as there vendors are out of stock for this medication at this time and do not have an estimate as to when they will get it. ID team aware.  ESRD on MWF HD Nephrology following for HD needs.  Seen at HD on 12/12.  Hyperkalemia Resolved after HD.  Anemia of ESRD Overall stable.  Per nephrology, no indication for erythropoietin.  Essential hypertension Controlled on amlodipine  10 mg daily and labetalol  100 mg twice daily.  Clinically euvolemic.  SLE No acute flare.  Continue Plaquenil .  Body mass index is 21.92 kg/m.   DVT prophylaxis: heparin  injection 5,000 Units Start: 09/13/24 0600     Code Status: Full Code:  Family Communication: None at bedside Disposition:  Status is: Inpatient Remains inpatient appropriate because: Pending Nephrology able to source Daptomycin  for outpatient, patient could possibly DC home as early as 12/13 pending continued negative cultures.     Consultants:   Vascular surgery Nephrology Infectious disease  Procedures:   As noted above.  Subjective:  Seen at HD.  Denies complaints.  Aware that we are waiting for his outpatient IV antibiotics to be arranged.  Objective:   Vitals:   09/19/24 1130 09/19/24 1200 09/19/24 1217 09/19/24 1219  BP: 115/72 122/79 104/64 117/72  Pulse: 85 89 90 92  Resp: 16 18 18 17   Temp:    98.4 F (36.9 C)  TempSrc:      SpO2: 97%  98% 98% 98%  Weight:    65.4 kg  Height:        General exam: Young male, moderately built and thinly undergoing HD.  Did not appear in any distress. Respiratory system: Clear to auscultation. Respiratory effort normal.  Right upper anterior chest HD catheter site without acute findings. Cardiovascular system: S1 & S2 heard, RRR. No JVD, murmurs, rubs, gallops or clicks. No pedal edema.  Off telemetry. Gastrointestinal system: Abdomen is nondistended, soft and nontender. No organomegaly  or masses felt. Normal bowel sounds heard. Central nervous system: Alert and oriented. No focal neurological deficits. Extremities: Symmetric 5 x 5 power.  Right upper arm surgical site without acute findings. Skin: No rashes, lesions or ulcers Psychiatry: Judgement and insight appear normal. Mood & affect appropriate.     Data Reviewed:   I have personally reviewed following labs and imaging studies   CBC: Recent Labs  Lab 09/17/24 0825 09/19/24 0437 09/19/24 0807  WBC 5.0 5.2 5.1  HGB 9.1* 9.4* 8.9*  HCT 29.3* 29.4* 28.5*  MCV 88.3 87.5 88.2  PLT 234 217 222    Basic Metabolic Panel: Recent Labs  Lab 09/12/24 1340 09/13/24 0250 09/13/24 1411 09/17/24 0225 09/19/24 0807  NA 136 132*  --  132* 133*  K 4.5 5.6* 4.8 4.5 4.8  CL 96* 96*  --  93* 94*  CO2  --  23  --  27 28  GLUCOSE 83 134*  --  93 119*  BUN 29* 35*  --  49* 50*  CREATININE 6.50* 8.04*  --  11.00* 10.58*  CALCIUM  --  8.6*  --  8.4* 8.7*  MG  --   --   --  2.1  --   PHOS  --  7.5*  --  8.5* 8.5*    Liver Function Tests: Recent Labs  Lab 09/13/24 0250 09/19/24 0807  AST 13*  --   ALT 9  --   ALKPHOS 50  --   BILITOT 0.3  --   PROT 7.0  --   ALBUMIN  2.9* 3.0*    CBG: No results for input(s): GLUCAP in the last 168 hours.  Microbiology Studies:   Recent Results (from the past 240 hours)  Blood culture (routine x 2)     Status: Abnormal (Preliminary result)   Collection Time: 09/11/24  7:39 PM   Specimen: BLOOD LEFT HAND  Result Value Ref Range Status   Specimen Description BLOOD LEFT HAND  Final   Special Requests   Final    BOTTLES DRAWN AEROBIC AND ANAEROBIC Blood Culture results may not be optimal due to an inadequate volume of blood received in culture bottles   Culture  Setup Time   Final    GRAM POSITIVE COCCI IN CLUSTERS IN BOTH AEROBIC AND ANAEROBIC BOTTLES CRITICAL RESULT CALLED TO, READ BACK BY AND VERIFIED WITH: PHARMD LYDIA CHEN ON 09/12/24 @ 1701 BY DRT Performed at  [facility], [address] [place], [place] [address]    Culture STAPHYLOCOCCUS EPIDERMIDIS (A)  Final   Report Status PENDING  Incomplete   Organism ID, Bacteria STAPHYLOCOCCUS EPIDERMIDIS  Final      Susceptibility   Staphylococcus epidermidis - MIC*    CIPROFLOXACIN <=0.5 SENSITIVE Sensitive     ERYTHROMYCIN <=0.25 SENSITIVE Sensitive     GENTAMICIN <=0.5 SENSITIVE Sensitive     OXACILLIN >=4 RESISTANT Resistant     TETRACYCLINE <=1 SENSITIVE Sensitive     VANCOMYCIN 1 SENSITIVE Sensitive  TRIMETH /SULFA  80 RESISTANT Resistant     CLINDAMYCIN <=0.25 SENSITIVE Sensitive     RIFAMPIN <=0.5 SENSITIVE Sensitive     Inducible Clindamycin NEGATIVE Sensitive     * STAPHYLOCOCCUS EPIDERMIDIS  Blood Culture ID Panel (Reflexed)     Status: Abnormal   Collection Time: 09/11/24  7:39 PM  Result Value Ref Range Status   Enterococcus faecalis NOT DETECTED NOT DETECTED Final   Enterococcus Faecium NOT DETECTED NOT DETECTED Final   Listeria monocytogenes NOT DETECTED NOT DETECTED Final   Staphylococcus species DETECTED (A) NOT DETECTED Final    Comment: CRITICAL RESULT CALLED TO, READ BACK BY AND VERIFIED WITH: PHARMD LYDIA CHEN ON 09/12/24 @ 1701 BY DRT    Staphylococcus aureus (BCID) NOT DETECTED NOT DETECTED Final   Staphylococcus epidermidis DETECTED (A) NOT DETECTED Final    Comment: Methicillin (oxacillin) resistant coagulase negative staphylococcus. Possible blood culture contaminant (unless isolated from more than one blood culture draw or clinical case suggests pathogenicity). No antibiotic treatment is indicated for blood  culture contaminants. CRITICAL RESULT CALLED TO, READ BACK BY AND VERIFIED WITH: PHARMD LYDIA CHEN ON 09/12/24 @ 1701 BY DRT    Staphylococcus lugdunensis NOT DETECTED NOT DETECTED Final   Streptococcus species NOT DETECTED NOT DETECTED Final   Streptococcus agalactiae NOT DETECTED NOT DETECTED Final   Streptococcus pneumoniae NOT DETECTED NOT DETECTED  Final   Streptococcus pyogenes NOT DETECTED NOT DETECTED Final   A.calcoaceticus-baumannii NOT DETECTED NOT DETECTED Final   Bacteroides fragilis NOT DETECTED NOT DETECTED Final   Enterobacterales NOT DETECTED NOT DETECTED Final   Enterobacter cloacae complex NOT DETECTED NOT DETECTED Final   Escherichia coli NOT DETECTED NOT DETECTED Final   Klebsiella aerogenes NOT DETECTED NOT DETECTED Final   Klebsiella oxytoca NOT DETECTED NOT DETECTED Final   Klebsiella pneumoniae NOT DETECTED NOT DETECTED Final   Proteus species NOT DETECTED NOT DETECTED Final   Salmonella species NOT DETECTED NOT DETECTED Final   Serratia marcescens NOT DETECTED NOT DETECTED Final   Haemophilus influenzae NOT DETECTED NOT DETECTED Final   Neisseria meningitidis NOT DETECTED NOT DETECTED Final   Pseudomonas aeruginosa NOT DETECTED NOT DETECTED Final   Stenotrophomonas maltophilia NOT DETECTED NOT DETECTED Final   Candida albicans NOT DETECTED NOT DETECTED Final   Candida auris NOT DETECTED NOT DETECTED Final   Candida glabrata NOT DETECTED NOT DETECTED Final   Candida krusei NOT DETECTED NOT DETECTED Final   Candida parapsilosis NOT DETECTED NOT DETECTED Final   Candida tropicalis NOT DETECTED NOT DETECTED Final   Cryptococcus neoformans/gattii NOT DETECTED NOT DETECTED Final   Methicillin resistance mecA/C DETECTED (A) NOT DETECTED Final    Comment: CRITICAL RESULT CALLED TO, READ BACK BY AND VERIFIED WITH: PHARMD LYDIA CHEN ON 09/12/24 @ 1701 BY DRT Performed at [facility], [address] [place], [place] [address]   Surgical pcr screen     Status: None   Collection Time: 09/12/24 12:59 PM   Specimen: Nasal Mucosa; Nasal Swab  Result Value Ref Range Status   MRSA, PCR NEGATIVE NEGATIVE Final   Staphylococcus aureus NEGATIVE NEGATIVE Final    Comment: (NOTE) The Xpert SA Assay (FDA approved for NASAL specimens in patients 75 years of age and older), is one component of a  comprehensive surveillance program. It is not intended to diagnose infection nor to guide or monitor treatment. Performed at [facility], [address], [place], [place] [address]   Aerobic/Anaerobic Culture w Gram Stain (surgical/deep wound)  Status: None   Collection Time: 09/12/24  3:41 PM   Specimen: Path fluid; Body Fluid  Result Value Ref Range Status   Specimen Description WOUND RIGHT ARM  Final   Special Requests SWABS  Final   Gram Stain NO WBC SEEN NO ORGANISMS SEEN   Final   Culture   Final    ABUNDANT STAPHYLOCOCCUS EPIDERMIDIS NO ANAEROBES ISOLATED Performed at [facility], [address] [place], [place] [address]    Report Status 09/17/2024 FINAL  Final   Organism ID, Bacteria STAPHYLOCOCCUS EPIDERMIDIS  Final      Susceptibility   Staphylococcus epidermidis - MIC*    CIPROFLOXACIN <=0.5 SENSITIVE Sensitive     ERYTHROMYCIN <=0.25 SENSITIVE Sensitive     GENTAMICIN <=0.5 SENSITIVE Sensitive     OXACILLIN >=4 RESISTANT Resistant     TETRACYCLINE <=1 SENSITIVE Sensitive     VANCOMYCIN 1 SENSITIVE Sensitive     TRIMETH /SULFA  80 RESISTANT Resistant     CLINDAMYCIN <=0.25 SENSITIVE Sensitive     RIFAMPIN <=0.5 SENSITIVE Sensitive     Inducible Clindamycin NEGATIVE Sensitive     * ABUNDANT STAPHYLOCOCCUS EPIDERMIDIS  Culture, blood (Routine X 2) w Reflex to ID Panel     Status: None (Preliminary result)   Collection Time: 09/16/24  3:00 PM   Specimen: BLOOD  Result Value Ref Range Status   Specimen Description BLOOD SITE NOT SPECIFIED  Final   Special Requests   Final    BOTTLES DRAWN AEROBIC AND ANAEROBIC Blood Culture adequate volume   Culture   Final    NO GROWTH 3 DAYS Performed at [facility], [address], [place], [place] [address]    Report Status PENDING  Incomplete  Culture, blood (Routine X 2) w Reflex to ID Panel     Status: None (Preliminary result)   Collection Time: 09/16/24  3:12 PM   Specimen: BLOOD LEFT HAND   Result Value Ref Range Status   Specimen Description BLOOD LEFT HAND  Final   Special Requests   Final    BOTTLES DRAWN AEROBIC AND ANAEROBIC Blood Culture adequate volume   Culture   Final    NO GROWTH 3 DAYS Performed at [facility], [address], [place], [place] [address]    Report Status PENDING  Incomplete    Radiology Studies:  No results found.   Scheduled Meds:    amLODipine   10 mg Oral q morning   Chlorhexidine  Gluconate Cloth  6 each Topical Q0600   Chlorhexidine  Gluconate Cloth  6 each Topical Q0600   darbepoetin (ARANESP) injection - DIALYSIS  40 mcg Subcutaneous Q Fri-1800   heparin   5,000 Units Subcutaneous Q8H   hydroxychloroquine   200 mg Oral BH-q7a   labetalol   100 mg Oral BID   montelukast   10 mg Oral Daily   sevelamer  carbonate  1,600 mg Oral TID WC    Continuous Infusions:    anticoagulant sodium citrate      DAPTOmycin  Stopped (09/17/24 1909)     LOS: 7 days     Trenda Mar, MD,  FACP, [facility], [facility], [facility]   [facility] [place]      To contact the attending provider between 7A-7P or the covering provider during after hours 7P-7A, please log into the web site [email or web address] and access using universal [place] password for that web site. If you do not have the password, please call the hospital operator.  09/19/2024, 12:40 PM

## 2024-09-19 NOTE — Progress Notes (Signed)
 Received patient in bed to unit.  Alert and oriented.  Informed consent signed and in chart.   TX duration:3.5   Patient tolerated well.  Transported back to the room  Alert, without acute distress.  Hand-off given to patient's nurse.   Access used: Right HD internal jugular cath  Access issues: Needed midrun PRN heparin  per order and BFR to 350 due to high arterial pressures.  Total UF removed: 1L Medication(s) given: Heparin  in the beginning, and Heparin  midrun   09/19/24 1217  Vitals  Temp 98.4 F (36.9 C)  BP 104/64  Pulse Rate 90  Resp 18  Oxygen Therapy  SpO2 98 %  O2 Device Room Air  During Treatment Monitoring  Dialysate Potassium Concentration 3  Dialysate Calcium Concentration 2.5  Duration of HD Treatment -hour(s) 3.5 hour(s)  HD Safety Checks Performed Yes  Intra-Hemodialysis Comments Tx completed;Tolerated well  Post Treatment  Dialyzer Clearance Clear  Liters Processed 79.4  Fluid Removed (mL) 1000 mL  Tolerated HD Treatment Yes  Post-Hemodialysis Comments see notes, but tolerated well  Hemodialysis Catheter Right Internal jugular Double lumen Permanent (Tunneled)  Placement Date/Time: (c) 09/12/24 1202   Placed prior to admission: Yes  Time Out: Correct patient;Correct site;Correct procedure  Maximum sterile barrier precautions: Hand hygiene;Cap;Mask  Orientation: Right  Access Location: Internal jugular  Hemod...  Site Condition No complications  Blue Lumen Status Flushed;Antimicrobial dead end cap;Heparin  locked  Red Lumen Status Flushed;Antimicrobial dead end cap;Heparin  locked  Purple Lumen Status N/A  Catheter fill solution Heparin  1000 units/ml  Catheter fill volume (Arterial) 1.6 cc  Catheter fill volume (Venous) 1.6  Dressing Type Transparent  Dressing Status Clean, Dry, Intact  Drainage Description None  Dressing Change Due 09/22/24     Camellia Brasil LPN [facility]

## 2024-09-19 NOTE — Progress Notes (Signed)
 [facility] Progress Note   Subjective:   Seen on HD. Reports some nausea this AM but was able to eat breakfast. Denies SOB, CP, dizziness.   Objective Vitals:   09/19/24 0829 09/19/24 0830 09/19/24 0900 09/19/24 0930  BP: 135/80 135/80 127/77 124/66  Pulse: 87 87 83 90  Resp: 17 16 15 17   Temp:      TempSrc:      SpO2: 97% 96% 98% 98%  Weight:      Height:       Physical Exam General: Alert, well appearing male in NAD Heart: RRR, no murmurs Lungs: CTA bilaterally, respirations unlabored on RA Abdomen: Soft, non-distended, +BS Extremities: No edema b/l lower extremities Dialysis Access:  [facility]  Additional Objective Labs: Basic Metabolic Panel: Recent Labs  Lab 09/13/24 0250 09/13/24 1411 09/17/24 0225 09/19/24 0807  NA 132*  --  132* 133*  K 5.6* 4.8 4.5 4.8  CL 96*  --  93* 94*  CO2 23  --  27 28  GLUCOSE 134*  --  93 119*  BUN 35*  --  49* 50*  CREATININE 8.04*  --  11.00* 10.58*  CALCIUM 8.6*  --  8.4* 8.7*  PHOS 7.5*  --  8.5* PENDING   Liver Function Tests: Recent Labs  Lab 09/13/24 0250 09/19/24 0807  AST 13*  --   ALT 9  --   ALKPHOS 50  --   BILITOT 0.3  --   PROT 7.0  --   ALBUMIN  2.9* 3.0*   No results for input(s): LIPASE, AMYLASE in the last 168 hours. CBC: Recent Labs  Lab 09/13/24 0250 09/17/24 0825 09/19/24 0437 09/19/24 0807  WBC 4.6 5.0 5.2 5.1  HGB 9.7* 9.1* 9.4* 8.9*  HCT 30.2* 29.3* 29.4* 28.5*  MCV 86.5 88.3 87.5 88.2  PLT 158 234 217 222   Blood Culture    Component Value Date/Time   SDES BLOOD LEFT HAND 09/16/2024 1512   SPECREQUEST  09/16/2024 1512    BOTTLES DRAWN AEROBIC AND ANAEROBIC Blood Culture adequate volume   CULT  09/16/2024 1512    NO GROWTH 3 DAYS Performed at [facility], [address] [place], [place] [address]    REPTSTATUS PENDING 09/16/2024 1512    Cardiac Enzymes: Recent Labs  Lab 09/17/24 0225  CKTOTAL 87   CBG: No results for input(s): GLUCAP in the last  168 hours. Iron Studies: No results for input(s): IRON, TIBC, TRANSFERRIN, FERRITIN in the last 72 hours. @lablastinr3 @ Studies/Results: No results found. Medications:  anticoagulant sodium citrate      DAPTOmycin  Stopped (09/17/24 1909)    amLODipine   10 mg Oral q morning   Chlorhexidine  Gluconate Cloth  6 each Topical Q0600   Chlorhexidine  Gluconate Cloth  6 each Topical Q0600   heparin   5,000 Units Subcutaneous Q8H   heparin  sodium (porcine)  3,200 Units Intravenous Once   hydroxychloroquine   200 mg Oral BH-q7a   labetalol   100 mg Oral BID   montelukast   10 mg Oral Daily   sevelamer  carbonate  1,600 mg Oral TID WC    Dialysis Orders:  [facility] MWF [address]   [facility]   Assessment/Plan: 1. AVG infection/ MRSE bacteremia - excision of RU AVG and seroma with vein patch angioplasty by Dr. Gretta.  Blood cx's were 2/2+ for MRSE. Seen by ID. IV abx changed to daptomycin  but having trouble obtaining an adequate supply.  2. ESRD -on HD MWF.  HD Friday.  3. Hyperkalemia -  Resolved with HD.  4. Anemia of CKD- Hgb 8.9. Will start aranesp.  5. HTN/volume - Blood pressure in goal. Continue home meds. Low UF goal today.  6. Nutrition -  Renal diet w/fluid restrictions. Reviewed low K diet.   7. SLE - on plaquenil .     Lucie Collet, PA-C 09/19/2024, 9:58 AM  [facility] Pager: [phone]

## 2024-09-19 NOTE — Progress Notes (Signed)
 Pharmacy: Antimicrobial Stewardship Note  28 YOM with plans for Daptomycin  for 6 weeks to treat for AVG intravascular infection - s/p excision 12/5. Plan was to defer TEE since duration would be similar.   Informed on issues from the nephrology team that the patient's center ([facility] in [facility]) cannot obtain Daptomycin  due to national shortage  - see note from [employer].   There is no shortage or backorder that [facility] is aware of - so seems to be an issue with the buyer that the HD center utilizes. Called the center and discussed with Alm, the manager, who will check again with the buyer to see if other options may be available for purchase.   Discussed patient's allergy of hives/severe itching with Vancomycin and he does not want to pursue this at this time - even with benadryl.   In the meantime, plan is to pursue TEE next week to determine plan for antibiotics moving forward.  Will continue to monitor for any updates in ability for the patient's HD center to purchase. Appreciate nephrology's help with this situation.  Thank you for allowing pharmacy to be a part of this patients care.  Almarie Lunger, PharmD, BCPS, [facility] Infectious Diseases Clinical Pharmacist [phone] 09/19/2024 3:23 PM   **Pharmacist phone directory can now be found on [email or web address] ([email or web address]).  Listed under [facility].

## 2024-09-20 NOTE — Progress Notes (Signed)
 PROGRESS NOTE    Jorge [name]  FMW:[id number]  DOB: [birth year]  DOA: 09/11/2024 PCP: Austin Mutton, MD Outpatient Specialists:   Hospital course:  43 year old man with ESRD on HD, HTN, SLE on hydroxychloroquine , GERD was admitted infected nonfunctional right arm AV graft.  Patient underwent excision of the RUE AV graft with drainage and vein angioplasty.  Now with MRSA bacteremia with concern for endocarditis.  Patient was treated with cefepime , ID consulting recommends daptomycin  to complete 6 weeks course however there has been difficulty obtaining daptomycin  at his hemodialysis center.  Patient history of vancomycin allergy.  TEE scheduled for 12/16 in case he cannot get 6 full weeks of daptomycin  at HD.  Following surveillance blood cultures.  Subjective:  Patient has no complaints or concerns, understands they are waiting for antibiotics to be obtained by hemodialysis center and also awaiting results of surveillance cultures.   Objective: Vitals:   09/19/24 1318 09/19/24 2007 09/20/24 0514 09/20/24 0840  BP: 125/82 118/75 118/83 (!) 123/91  Pulse: 90 94 86 97  Resp:  18 18 18   Temp: 97.6 F (36.4 C) 98.3 F (36.8 C) 97.8 F (36.6 C) 97.9 F (36.6 C)  TempSrc: Oral Oral Oral Oral  SpO2: 97% 95% 96% 97%  Weight:      Height:        Intake/Output Summary (Last 24 hours) at 09/20/2024 1512 Last data filed at 09/20/2024 9390 Gross per 24 hour  Intake 150 ml  Output --  Net 150 ml   Filed Weights   09/17/24 1204 09/19/24 0803 09/19/24 1219  Weight: 67.9 kg 63.9 kg 65.4 kg     Exam:  General: Thin man sitting in bed watching TV, appears comfortable Eyes: sclera anicteric, conjuctiva mild injection bilaterally CVS: S1-S2, regular  Respiratory:  decreased air entry bilaterally secondary to decreased inspiratory effort, rales at bases  GI: NABS, soft, NT  LE: Warm and well-perfused Neuro: A/O x 3,  grossly nonfocal.  Psych: patient is logical and coherent,  judgement and insight appear normal, mood and affect appropriate to situation.  Data Reviewed:  Basic Metabolic Panel: Recent Labs  Lab 09/17/24 0225 09/19/24 0807  NA 132* 133*  K 4.5 4.8  CL 93* 94*  CO2 27 28  GLUCOSE 93 119*  BUN 49* 50*  CREATININE 11.00* 10.58*  CALCIUM 8.4* 8.7*  MG 2.1  --   PHOS 8.5* 8.5*    CBC: Recent Labs  Lab 09/17/24 0825 09/19/24 0437 09/19/24 0807  WBC 5.0 5.2 5.1  HGB 9.1* 9.4* 8.9*  HCT 29.3* 29.4* 28.5*  MCV 88.3 87.5 88.2  PLT 234 217 222     Scheduled Meds:  amLODipine   10 mg Oral q morning   Chlorhexidine  Gluconate Cloth  6 each Topical Q0600   Chlorhexidine  Gluconate Cloth  6 each Topical Q0600   darbepoetin (ARANESP ) injection - DIALYSIS  40 mcg Subcutaneous Q Fri-1800   heparin   5,000 Units Subcutaneous Q8H   hydroxychloroquine   200 mg Oral BH-q7a   labetalol   100 mg Oral BID   montelukast   10 mg Oral Daily   sevelamer  carbonate  1,600 mg Oral TID WC   Continuous Infusions:  DAPTOmycin  500 mg (09/19/24 1813)     Assessment & Plan:   Infected nonfunctional RUE AV graft s/p excision of graft MRSA bacteremia with concern for endocarditis Immunocompromised on Plaquenil  RUE AV graft was excised 12/5, s/p vein patch angioplasty, R brachial artery to brachial vein Graft growing out MRSA, blood cultures positive  for MRSA 12/4 Started on cefepime , changed to daptomycin  per ID, will need 6 weeks of daptomycin  to end 10/28/2024.  Has been difficult getting daptomycin  at HD center. Surveillance blood cultures have been negative to date ID recommends TEE, scheduled for 12/16 if unable to secure 6 weeks of daptomycin .  ESRD Followed by nephrology  HTN Well-controlled on amlodipine  10  SLE No evidence for acute flare Continue Plaquenil     DVT prophylaxis: Subcu heparin  Code Status: Full Family Communication: None today     Studies: No results found.  Principal Problem:   Staphylococcus epidermidis  bacteremia Active Problems:   Hyperkalemia   Hypertensive urgency   Right arm cellulitis   ESRD on hemodialysis (HCC)   Arteriovenous graft infection     Shaynah Hund Tublu Chessica Audia, [facility]  If 7PM-7AM, please contact night-coverage [email or web address]   LOS: 8 days

## 2024-09-20 NOTE — Progress Notes (Signed)
 [facility] Progress Note   Subjective:   Reports feeling well, no concerns. Denies SOB, CP, dizziness. Awaiting plan for outpatient antibiotics.   Objective Vitals:   09/19/24 1318 09/19/24 2007 09/20/24 0514 09/20/24 0840  BP: 125/82 118/75 118/83 (!) 123/91  Pulse: 90 94 86 97  Resp:  18 18 18   Temp: 97.6 F (36.4 C) 98.3 F (36.8 C) 97.8 F (36.6 C) 97.9 F (36.6 C)  TempSrc: Oral Oral Oral Oral  SpO2: 97% 95% 96% 97%  Weight:      Height:       Physical Exam General: Alert, well appearing male in NAD Heart: RRR, no murmurs Lungs: CTA bilaterally, respirations unlabored on RA Abdomen: Soft, non-distended, +BS Extremities: No edema b/l lower extremities Dialysis Access:  [facility]  Additional Objective Labs: Basic Metabolic Panel: Recent Labs  Lab 09/13/24 1411 09/17/24 0225 09/19/24 0807  NA  --  132* 133*  K 4.8 4.5 4.8  CL  --  93* 94*  CO2  --  27 28  GLUCOSE  --  93 119*  BUN  --  49* 50*  CREATININE  --  11.00* 10.58*  CALCIUM  --  8.4* 8.7*  PHOS  --  8.5* 8.5*   Liver Function Tests: Recent Labs  Lab 09/19/24 0807  ALBUMIN  3.0*   No results for input(s): LIPASE, AMYLASE in the last 168 hours. CBC: Recent Labs  Lab 09/17/24 0825 09/19/24 0437 09/19/24 0807  WBC 5.0 5.2 5.1  HGB 9.1* 9.4* 8.9*  HCT 29.3* 29.4* 28.5*  MCV 88.3 87.5 88.2  PLT 234 217 222   Blood Culture    Component Value Date/Time   SDES BLOOD LEFT HAND 09/16/2024 1512   SPECREQUEST  09/16/2024 1512    BOTTLES DRAWN AEROBIC AND ANAEROBIC Blood Culture adequate volume   CULT  09/16/2024 1512    NO GROWTH 4 DAYS Performed at [facility], [address] [place], [place] [address]    REPTSTATUS PENDING 09/16/2024 1512    Cardiac Enzymes: Recent Labs  Lab 09/17/24 0225  CKTOTAL 87   CBG: No results for input(s): GLUCAP in the last 168 hours. Iron Studies: No results for input(s): IRON, TIBC, TRANSFERRIN, FERRITIN in the last 72  hours. @lablastinr3 @ Studies/Results: No results found. Medications:  DAPTOmycin  500 mg (09/19/24 1813)    amLODipine   10 mg Oral q morning   Chlorhexidine  Gluconate Cloth  6 each Topical Q0600   Chlorhexidine  Gluconate Cloth  6 each Topical Q0600   darbepoetin (ARANESP ) injection - DIALYSIS  40 mcg Subcutaneous Q Fri-1800   heparin   5,000 Units Subcutaneous Q8H   hydroxychloroquine   200 mg Oral BH-q7a   labetalol   100 mg Oral BID   montelukast   10 mg Oral Daily   sevelamer  carbonate  1,600 mg Oral TID WC    Dialysis Orders: [facility] MWF [address]   [facility]   Assessment/Plan: 1. AVG infection/ MRSE bacteremia - excision of RU AVG and seroma with vein patch angioplasty by Dr. Gretta.  Blood cx's were 2/2+ for MRSE. Seen by ID. IV abx changed to daptomycin  but having trouble obtaining an adequate supply.  2. ESRD -on HD MWF.  Tolerating HD without issues. Next HD Monday.  3. Hyperkalemia - Resolved with HD.  4. Anemia of CKD- Hgb 8.9. Started aranesp .  5. HTN/volume - Blood pressure in goal. Continue home meds. 6. Nutrition -  Renal diet w/fluid restrictions. Reviewed low K diet.   7. SLE - on  plaquenil .   Lucie Collet, PA-C 09/20/2024, 12:10 PM  [facility] Pager: [phone]

## 2024-09-21 LAB — CULTURE, BLOOD (ROUTINE X 2)
Culture: NO GROWTH
Culture: NO GROWTH
Special Requests: ADEQUATE
Special Requests: ADEQUATE

## 2024-09-21 NOTE — Progress Notes (Signed)
 PROGRESS NOTE    Jorge [name]  FMW:[id number]  DOB: 1981-07-01  DOA: 09/11/2024 PCP: Austin Mutton, MD Outpatient Specialists:   Hospital course:  43 year old man with ESRD on HD, HTN, SLE on hydroxychloroquine , GERD was admitted infected nonfunctional right arm AV graft.  Patient underwent excision of the RUE AV graft with drainage and vein angioplasty.  Now with MRSA bacteremia with concern for endocarditis.  Patient was treated with cefepime , ID consulting recommends daptomycin  to complete 6 weeks course however there has been difficulty obtaining daptomycin  at his hemodialysis center.  Patient history of vancomycin allergy.  TEE scheduled for 12/16 in case he cannot get 6 full weeks of daptomycin  at HD.  Following surveillance blood cultures.  Subjective:  Without any new complaints, not looking forward to TEE tomorrow but understands the need for it.  Objective: Vitals:   09/20/24 2118 09/21/24 0437 09/21/24 0804 09/21/24 1311  BP: 127/87 119/69 (!) 105/52 (P) 137/87  Pulse: 90 88 97 (P) 93  Resp: 19 19 18  (P) 18  Temp: 97.8 F (36.6 C) 98.2 F (36.8 C) 97.7 F (36.5 C)   TempSrc: Oral Oral Oral (P) Oral  SpO2: 95% 95% 96% (P) 97%  Weight:      Height:        Intake/Output Summary (Last 24 hours) at 09/21/2024 1645 Last data filed at 09/21/2024 0700 Gross per 24 hour  Intake 300 ml  Output --  Net 300 ml   Filed Weights   09/17/24 1204 09/19/24 0803 09/19/24 1219  Weight: 67.9 kg 63.9 kg 65.4 kg     Exam:  General: Thin man sitting in bed watching TV, appears comfortable Eyes: sclera anicteric, conjuctiva mild injection bilaterally CVS: S1-S2, regular  Respiratory:  decreased air entry bilaterally secondary to decreased inspiratory effort, rales at bases  GI: NABS, soft, NT  LE: Warm and well-perfused Neuro: A/O x 3,  grossly nonfocal.  Psych: patient is logical and coherent, judgement and insight appear normal, mood and affect appropriate to  situation.  Data Reviewed:  Basic Metabolic Panel: Recent Labs  Lab 09/17/24 0225 09/19/24 0807  NA 132* 133*  K 4.5 4.8  CL 93* 94*  CO2 27 28  GLUCOSE 93 119*  BUN 49* 50*  CREATININE 11.00* 10.58*  CALCIUM 8.4* 8.7*  MG 2.1  --   PHOS 8.5* 8.5*    CBC: Recent Labs  Lab 09/17/24 0825 09/19/24 0437 09/19/24 0807  WBC 5.0 5.2 5.1  HGB 9.1* 9.4* 8.9*  HCT 29.3* 29.4* 28.5*  MCV 88.3 87.5 88.2  PLT 234 217 222     Scheduled Meds:  amLODipine   10 mg Oral q morning   Chlorhexidine  Gluconate Cloth  6 each Topical Q0600   Chlorhexidine  Gluconate Cloth  6 each Topical Q0600   darbepoetin (ARANESP ) injection - DIALYSIS  40 mcg Subcutaneous Q Fri-1800   heparin   5,000 Units Subcutaneous Q8H   hydroxychloroquine   200 mg Oral BH-q7a   labetalol   100 mg Oral BID   montelukast   10 mg Oral Daily   sevelamer  carbonate  1,600 mg Oral TID WC   Continuous Infusions:  DAPTOmycin  500 mg (09/19/24 1813)     Assessment & Plan:   Infected nonfunctional RUE AV graft s/p excision of graft MRSA bacteremia with concern for endocarditis Immunocompromised on Plaquenil  RUE AV graft was excised 12/5, s/p vein patch angioplasty, R brachial artery to brachial vein Graft growing out MRSA, blood cultures positive for MRSA 12/4 Started on cefepime , changed to  daptomycin  per ID, will need 6 weeks of daptomycin  to end 10/28/2024.  Has been difficult getting daptomycin  at HD center. Surveillance blood cultures have been negative to date ID recommends TEE, scheduled for 12/16 if unable to secure 6 weeks of daptomycin .  ESRD Followed by nephrology  HTN Well-controlled on amlodipine  10  SLE No evidence for acute flare Continue Plaquenil     DVT prophylaxis: Subcu heparin  Code Status: Full Family Communication: None today     Studies: No results found.  Principal Problem:   Staphylococcus epidermidis bacteremia Active Problems:   Hyperkalemia   Hypertensive urgency   Right  arm cellulitis   ESRD on hemodialysis (HCC)   Arteriovenous graft infection     Jorge [name], [facility]  If 7PM-7AM, please contact night-coverage [email or web address]   LOS: 9 days

## 2024-09-21 NOTE — Progress Notes (Signed)
°  [facility] Progress Note   Subjective:   Feeling well, no concerns. Denies SOB, CP, dizziness, nausea.   Objective Vitals:   09/20/24 2114 09/20/24 2118 09/21/24 0437 09/21/24 0804  BP: (!) 153/119 127/87 119/69 (!) 105/52  Pulse: 98 90 88 97  Resp: 19 19 19 18   Temp: 97.8 F (36.6 C) 97.8 F (36.6 C) 98.2 F (36.8 C) 97.7 F (36.5 C)  TempSrc: Oral Oral Oral Oral  SpO2: 98% 95% 95% 96%  Weight:      Height:       Physical Exam General: Alert, well appearing male in NAD Heart: RRR, no murmurs Lungs: CTA bilaterally, respirations unlabored on RA Abdomen: Soft, non-distended, +BS Extremities: No edema b/l lower extremities Dialysis Access:  [facility]   Additional Objective Labs: Basic Metabolic Panel: Recent Labs  Lab 09/17/24 0225 09/19/24 0807  NA 132* 133*  K 4.5 4.8  CL 93* 94*  CO2 27 28  GLUCOSE 93 119*  BUN 49* 50*  CREATININE 11.00* 10.58*  CALCIUM 8.4* 8.7*  PHOS 8.5* 8.5*   Liver Function Tests: Recent Labs  Lab 09/19/24 0807  ALBUMIN  3.0*   No results for input(s): LIPASE, AMYLASE in the last 168 hours. CBC: Recent Labs  Lab 09/17/24 0825 09/19/24 0437 09/19/24 0807  WBC 5.0 5.2 5.1  HGB 9.1* 9.4* 8.9*  HCT 29.3* 29.4* 28.5*  MCV 88.3 87.5 88.2  PLT 234 217 222   Blood Culture    Component Value Date/Time   SDES BLOOD LEFT HAND 09/16/2024 1512   SPECREQUEST  09/16/2024 1512    BOTTLES DRAWN AEROBIC AND ANAEROBIC Blood Culture adequate volume   CULT  09/16/2024 1512    NO GROWTH 5 DAYS Performed at [facility], [address], [place], [place] [address]    REPTSTATUS 09/21/2024 FINAL 09/16/2024 1512    Cardiac Enzymes: Recent Labs  Lab 09/17/24 0225  CKTOTAL 87   CBG: No results for input(s): GLUCAP in the last 168 hours. Iron Studies: No results for input(s): IRON, TIBC, TRANSFERRIN, FERRITIN in the last 72 hours. @lablastinr3 @ Studies/Results: No results found. Medications:   DAPTOmycin  500 mg (09/19/24 1813)    amLODipine   10 mg Oral q morning   Chlorhexidine  Gluconate Cloth  6 each Topical Q0600   Chlorhexidine  Gluconate Cloth  6 each Topical Q0600   darbepoetin (ARANESP ) injection - DIALYSIS  40 mcg Subcutaneous Q Fri-1800   heparin   5,000 Units Subcutaneous Q8H   hydroxychloroquine   200 mg Oral BH-q7a   labetalol   100 mg Oral BID   montelukast   10 mg Oral Daily   sevelamer  carbonate  1,600 mg Oral TID WC    Dialysis Orders: [facility] MWF [address]   [facility]   Assessment/Plan: 1. AVG infection/ MRSE bacteremia - excision of RU AVG and seroma with vein patch angioplasty by Dr. Gretta.  Blood cx's were 2/2+ for MRSE. Seen by ID. IV abx changed to daptomycin  but having trouble obtaining an adequate supply.  2. ESRD -on HD MWF.  Tolerating HD without issues. Next HD Monday.  3. Hyperkalemia - Resolved with HD.  4. Anemia of CKD- Hgb 8.9. Started aranesp .  5. HTN/volume - Blood pressure in goal. Continue home meds. 6. Nutrition -  Renal diet w/fluid restrictions. Reviewed low K diet.   7. SLE - on plaquenil .   Lucie Collet, PA-C 09/21/2024, 10:31 AM  [facility] Pager: [phone]

## 2024-09-22 LAB — BASIC METABOLIC PANEL WITH GFR
Anion gap: 14 (ref 5–15)
BUN: 67 mg/dL — ABNORMAL HIGH (ref 6–20)
CO2: 24 mmol/L (ref 22–32)
Calcium: 9 mg/dL (ref 8.9–10.3)
Chloride: 100 mmol/L (ref 98–111)
Creatinine, Ser: 12.68 mg/dL — ABNORMAL HIGH (ref 0.61–1.24)
GFR, Estimated: 5 mL/min — ABNORMAL LOW (ref >60)
Glucose, Bld: 86 mg/dL (ref 70–99)
Potassium: 5.8 mmol/L — ABNORMAL HIGH (ref 3.5–5.1)
Sodium: 138 mmol/L (ref 135–145)

## 2024-09-22 LAB — CBC
HCT: 28.1 % — ABNORMAL LOW (ref 39.0–52.0)
Hemoglobin: 9 g/dL — ABNORMAL LOW (ref 13.0–17.0)
MCH: 27.8 pg (ref 26.0–34.0)
MCHC: 32 g/dL (ref 30.0–36.0)
MCV: 86.7 fL (ref 80.0–100.0)
Platelets: 198 K/uL (ref 150–400)
RBC: 3.24 MIL/uL — ABNORMAL LOW (ref 4.22–5.81)
RDW: 13.2 % (ref 11.5–15.5)
WBC: 7.5 K/uL (ref 4.0–10.5)
nRBC: 0 % (ref 0.0–0.2)

## 2024-09-22 MED ORDER — METOPROLOL TARTRATE 5 MG/5ML IV SOLN: 5.0000 mg | INTRAVENOUS | Status: DC | PRN

## 2024-09-22 MED ORDER — IPRATROPIUM-ALBUTEROL 0.5-2.5 (3) MG/3ML IN SOLN: 3.0000 mL | RESPIRATORY_TRACT | Status: DC | PRN

## 2024-09-22 MED ORDER — ONDANSETRON HCL 4 MG/2ML IJ SOLN
INTRAMUSCULAR | Status: AC
  Filled 2024-09-22: qty 2

## 2024-09-22 NOTE — Progress Notes (Signed)
 [facility] Progress Note   Subjective:    Seen and examined patient on HD. Tolerating UFG 2L. Noted plan for TEE today.  Objective Vitals:   09/22/24 1130 09/22/24 1200 09/22/24 1217 09/22/24 1218  BP: 122/81 123/80 128/86 134/86  Pulse: 78 78 80 84  Resp: 13 11 14 13   Temp:    97.6 F (36.4 C)  TempSrc:      SpO2:    100%  Weight:      Height:       Physical Exam General: Alert, well appearing male in NAD Heart: RRR, no murmurs Lungs: CTA bilaterally, respirations unlabored on RA Abdomen: Soft, non-distended, +BS Extremities: No edema b/l lower extremities Dialysis Access:  [facility]   Filed Weights   09/19/24 0803 09/19/24 1219 09/22/24 0821  Weight: 63.9 kg 65.4 kg 66.8 kg    Intake/Output Summary (Last 24 hours) at 09/22/2024 1227 Last data filed at 09/22/2024 9377 Gross per 24 hour  Intake 180 ml  Output 0 ml  Net 180 ml    Additional Objective Labs: Basic Metabolic Panel: Recent Labs  Lab 09/17/24 0225 09/19/24 0807 09/22/24 0358  NA 132* 133* 138  K 4.5 4.8 5.8*  CL 93* 94* 100  CO2 27 28 24   GLUCOSE 93 119* 86  BUN 49* 50* 67*  CREATININE 11.00* 10.58* 12.68*  CALCIUM 8.4* 8.7* 9.0  PHOS 8.5* 8.5*  --    Liver Function Tests: Recent Labs  Lab 09/19/24 0807  ALBUMIN  3.0*   No results for input(s): LIPASE, AMYLASE in the last 168 hours. CBC: Recent Labs  Lab 09/17/24 0825 09/19/24 0437 09/19/24 0807 09/22/24 0358  WBC 5.0 5.2 5.1 7.5  HGB 9.1* 9.4* 8.9* 9.0*  HCT 29.3* 29.4* 28.5* 28.1*  MCV 88.3 87.5 88.2 86.7  PLT 234 217 222 198   Blood Culture    Component Value Date/Time   SDES BLOOD LEFT HAND 09/16/2024 1512   SPECREQUEST  09/16/2024 1512    BOTTLES DRAWN AEROBIC AND ANAEROBIC Blood Culture adequate volume   CULT  09/16/2024 1512    NO GROWTH 5 DAYS Performed at [facility], [address], [place], [place] [address]    REPTSTATUS 09/21/2024 FINAL 09/16/2024 1512    Cardiac Enzymes: Recent  Labs  Lab 09/17/24 0225  CKTOTAL 87   CBG: No results for input(s): GLUCAP in the last 168 hours. Iron Studies: No results for input(s): IRON, TIBC, TRANSFERRIN, FERRITIN in the last 72 hours. Lab Results  Component Value Date   INR 1.1 01/14/2024   Studies/Results: No results found.  Medications:  DAPTOmycin  500 mg (09/19/24 1813)    amLODipine   10 mg Oral q morning   Chlorhexidine  Gluconate Cloth  6 each Topical Q0600   Chlorhexidine  Gluconate Cloth  6 each Topical Q0600   darbepoetin (ARANESP ) injection - DIALYSIS  40 mcg Subcutaneous Q Fri-1800   heparin   5,000 Units Subcutaneous Q8H   hydroxychloroquine   200 mg Oral BH-q7a   labetalol   100 mg Oral BID   montelukast   10 mg Oral Daily   sevelamer  carbonate  1,600 mg Oral TID WC    Dialysis Orders: [facility] MWF [address]  [facility]   Assessment/Plan: 1. AVG infection/ MRSE bacteremia - excision of RU AVG and seroma with vein patch angioplasty by Dr. Gretta. Blood cx's were 2/2+ for MRSE. Seen by ID. IV abx changed to daptomycin  but having trouble obtaining an adequate supply. Discussed with our Renal Navigator today who informed me  his outpatient HD clinic will have 10 doses of Daptomycin  soon but more doses are needed for full completion of ABX course. Plan to TEE today. 2. ESRD -on HD MWF.  Tolerating HD without issues. On HD.  3. Hyperkalemia - Resolved with HD.  4. Anemia of CKD- Hgb 8.9. Started aranesp .  5. HTN/volume - Blood pressure in goal. Continue home meds. 6. Nutrition -  Renal diet w/fluid restrictions. Reviewed low K diet.   7. SLE - on plaquenil   Charmaine Piety, NP [facility] 09/22/2024,12:27 PM  LOS: 10 days

## 2024-09-22 NOTE — TOC Progression Note (Signed)
 Transition of Care [facility]) - Progression Note    Patient Details  Name: Jorge [name] MRN: [id number] Date of Birth: 12/26/1980  [facility]) CM/SW Contact  Tom-Johnson, Johney Perotti Daphne, RN Phone Number: 09/22/2024, 4:10 PM  Clinical Narrative:     TEE scheduled for tomorrow 09/23/24 to r/o Endocarditis in case outpatient HD cannot get full 6 weeks of Daptomycin  at HD.  Patient continues iHD.   CM will continue to follow as patient progresses with care towards discharge.                    Expected Discharge Plan and Services                                               Social Drivers of Health (SDOH) Interventions SDOH Screenings   Food Insecurity: No Food Insecurity (09/13/2024)  Housing: Low Risk (09/13/2024)  Transportation Needs: No Transportation Needs (09/13/2024)  Recent Concern: Transportation Needs - Unmet Transportation Needs (07/10/2024)   Received from [facility]  Utilities: Not At Risk (09/13/2024)  Social Connections: Unknown (02/05/2022)   Received from [facility]  Tobacco Use: Low Risk (09/12/2024)    Readmission Risk Interventions    09/12/2024   12:56 PM  Readmission Risk Prevention Plan  Transportation Screening Complete  PCP or Specialist Appt within 5-7 Days Complete  Home Care Screening Complete  Medication Review (RN CM) Complete

## 2024-09-22 NOTE — Progress Notes (Signed)
° °  [name] has been requested to perform a transesophageal echocardiogram on Jorge [name] for bacteremia.  After careful review of history and examination, the risks and benefits of transesophageal echocardiogram have been explained including risks of esophageal damage, perforation (1:10,000 risk), bleeding, pharyngeal hematoma as well as other potential complications associated with conscious sedation including aspiration, arrhythmia, respiratory failure and death. Alternatives to treatment were discussed, questions were answered. Patient is willing to proceed.   Patient with history of esophageal strictures noted on EGD in 2017.  Prior notes suggest that his dysphagia had improved significantly, by that time with only mild disease noted.  He feels that his dysphagia has also improved.  However given plans of TEE and larger probe size, performing cardiologist may want GI to evaluate prior to.  Will send staff message to primary cardiologist to review tomorrow, make n.p.o. at midnight.  Assuming okay to continue patient is agreeable to plan.  Will hold off on placing orders until decision has formally been made to continue.  Thom LITTIE Sluder, PA-C  09/22/2024 4:03 PM

## 2024-09-22 NOTE — Progress Notes (Signed)
 1.5 Liters ultrafiltration, condition stable, and report was given to the primary RN.

## 2024-09-22 NOTE — H&P (View-Only) (Signed)
° °  [name] has been requested to perform a transesophageal echocardiogram on Jorge [name] for bacteremia.  After careful review of history and examination, the risks and benefits of transesophageal echocardiogram have been explained including risks of esophageal damage, perforation (1:10,000 risk), bleeding, pharyngeal hematoma as well as other potential complications associated with conscious sedation including aspiration, arrhythmia, respiratory failure and death. Alternatives to treatment were discussed, questions were answered. Patient is willing to proceed.   Patient with history of esophageal strictures noted on EGD in 2017.  Prior notes suggest that his dysphagia had improved significantly, by that time with only mild disease noted.  He feels that his dysphagia has also improved.  However given plans of TEE and larger probe size, performing cardiologist may want GI to evaluate prior to.  Will send staff message to primary cardiologist to review tomorrow, make n.p.o. at midnight.  Assuming okay to continue patient is agreeable to plan.  Will hold off on placing orders until decision has formally been made to continue.  Thom LITTIE Sluder, PA-C  09/22/2024 4:03 PM

## 2024-09-22 NOTE — Progress Notes (Signed)
 Contacted by Marcey at [facility], clinic has obtained daptomycin  and has 10 doses on the way this am. Have informed care team including nephrology, attending, pharmacy and sw/cm.   Lavanda Shakelia Scrivner Dialysis Navigator [phone]

## 2024-09-22 NOTE — Hospital Course (Addendum)
 Brief Narrative:   43 year old man with ESRD on HD, HTN, SLE on hydroxychloroquine , GERD was admitted infected nonfunctional right arm AV graft.  Patient underwent excision of the RUE AV graft with drainage and vein angioplasty.  Now with MRSA bacteremia with concern for endocarditis.  Patient was treated with cefepime , ID consulting recommends daptomycin  to complete 6 weeks course however there has been difficulty obtaining daptomycin  at his hemodialysis center.  Patient history of vancomycin allergy.  TEE scheduled for 12/16 in case he cannot get 6 full weeks of daptomycin  at HD.  Following surveillance blood cultures.  Eventually echocardiogram was unremarkable.  Currently doing well.  Once cleared by ID, discharge with antibiotics.  EOT would be October 10, 2024.  Assessment & Plan:    Infected nonfunctional RUE AV graft s/p excision of graft MRSA bacteremia with concern for endocarditis Immunocompromised on Plaquenil  RUE AV graft was excised 12/5, s/p vein patch angioplasty, R brachial artery to brachial vein Graft growing out MRSA, blood cultures positive for MRSA 12/4 Surveillance blood cultures have been negative to date TEE is negative, 4 weeks of antibiotics should suffice.  Daptomycin  EOT 10/10/2024.  Will confirm with ID. May need HD catheter to be changed   ESRD on HD Hyperkalemia Followed by nephrology Give Lokelma  rest of it can be managed with dialysis   HTN On Norvasc , labetalol  IV as needed   SLE No evidence for acute flare Continue Plaquenil        DVT prophylaxis: Subcu heparin  Code Status: Full Family Communication: None today Status is: Inpatient Remains inpatient appropriate because:   PT Follow up Recs:   Subjective: Feeling well no complaints Tolerated TEE today.  Wishing to go home   Examination: General exam: Appears calm and comfortable  Respiratory system: Clear to auscultation. Respiratory effort normal. Cardiovascular system: S1 & S2 heard,  RRR. No JVD, murmurs, rubs, gallops or clicks. No pedal edema. Gastrointestinal system: Abdomen is nondistended, soft and nontender. No organomegaly or masses felt. Normal bowel sounds heard. Central nervous system: Alert and oriented. No focal neurological deficits. Extremities: Symmetric 5 x 5 power. Skin: No rashes, lesions or ulcers Psychiatry: Judgement and insight appear normal. Mood & affect appropriate.

## 2024-09-22 NOTE — Progress Notes (Signed)
 PROGRESS NOTE    Jorge [name]  FMW:[id number] DOB: [birth year] DOA: 09/11/2024 PCP: Austin Mutton, MD    Brief Narrative:   43 year old man with ESRD on HD, HTN, SLE on hydroxychloroquine , GERD was admitted infected nonfunctional right arm AV graft.  Patient underwent excision of the RUE AV graft with drainage and vein angioplasty.  Now with MRSA bacteremia with concern for endocarditis.  Patient was treated with cefepime , ID consulting recommends daptomycin  to complete 6 weeks course however there has been difficulty obtaining daptomycin  at his hemodialysis center.  Patient history of vancomycin allergy.  TEE scheduled for 12/16 in case he cannot get 6 full weeks of daptomycin  at HD.  Following surveillance blood cultures.   Assessment & Plan:    Infected nonfunctional RUE AV graft s/p excision of graft MRSA bacteremia with concern for endocarditis Immunocompromised on Plaquenil  RUE AV graft was excised 12/5, s/p vein patch angioplasty, R brachial artery to brachial vein Graft growing out MRSA, blood cultures positive for MRSA 12/4 Started on cefepime , changed to daptomycin  per ID, will need 6 weeks of daptomycin  to end 10/28/2024.  Has been difficult getting daptomycin  at HD center. Surveillance blood cultures have been negative to date ID recommends TEE, scheduled for 12/16 if unable to secure 6 weeks of daptomycin .   ESRD on HD Followed by nephrology   HTN On Norvasc , labetalol  IV as needed   SLE No evidence for acute flare Continue Plaquenil        DVT prophylaxis: Subcu heparin  Code Status: Full Family Communication: None today Status is: Inpatient Remains inpatient appropriate because: Plans for TEE 12/16   PT Follow up Recs:   Subjective: Seen during HD which had to be paused briefly due to hypotension. Otherwise doing better   Examination:  General exam: Appears calm and comfortable  Respiratory system: Clear to auscultation. Respiratory effort  normal. Cardiovascular system: S1 & S2 heard, RRR. No JVD, murmurs, rubs, gallops or clicks. No pedal edema. Gastrointestinal system: Abdomen is nondistended, soft and nontender. No organomegaly or masses felt. Normal bowel sounds heard. Central nervous system: Alert and oriented. No focal neurological deficits. Extremities: Symmetric 5 x 5 power. Skin: No rashes, lesions or ulcers Psychiatry: Judgement and insight appear normal. Mood & affect appropriate.                Diet Orders (From admission, onward)     Start     Ordered   09/12/24 1832  Diet renal with fluid restriction Fluid restriction: 1200 mL Fluid; Room service appropriate? Yes; Fluid consistency: Thin  Diet effective now       Question Answer Comment  Fluid restriction: 1200 mL Fluid   Room service appropriate? Yes   Fluid consistency: Thin      09/12/24 1831            Objective: Vitals:   09/22/24 1130 09/22/24 1200 09/22/24 1217 09/22/24 1218  BP: 122/81 123/80 128/86 134/86  Pulse: 78 78 80 84  Resp: 13 11 14 13   Temp:    97.6 F (36.4 C)  TempSrc:      SpO2:    100%  Weight:      Height:        Intake/Output Summary (Last 24 hours) at 09/22/2024 1241 Last data filed at 09/22/2024 1218 Gross per 24 hour  Intake 180 ml  Output 400 ml  Net -220 ml   Filed Weights   09/19/24 0803 09/19/24 1219 09/22/24 0821  Weight: 63.9 kg 65.4 kg 66.8  kg    Scheduled Meds:  amLODipine   10 mg Oral q morning   Chlorhexidine  Gluconate Cloth  6 each Topical Q0600   Chlorhexidine  Gluconate Cloth  6 each Topical Q0600   darbepoetin (ARANESP ) injection - DIALYSIS  40 mcg Subcutaneous Q Fri-1800   heparin   5,000 Units Subcutaneous Q8H   hydroxychloroquine   200 mg Oral BH-q7a   labetalol   100 mg Oral BID   montelukast   10 mg Oral Daily   sevelamer  carbonate  1,600 mg Oral TID WC   Continuous Infusions:  DAPTOmycin  500 mg (09/19/24 1813)    Nutritional status     Body mass index is 22.39  kg/m.  Data Reviewed:   CBC: Recent Labs  Lab 09/17/24 0825 09/19/24 0437 09/19/24 0807 09/22/24 0358  WBC 5.0 5.2 5.1 7.5  HGB 9.1* 9.4* 8.9* 9.0*  HCT 29.3* 29.4* 28.5* 28.1*  MCV 88.3 87.5 88.2 86.7  PLT 234 217 222 198   Basic Metabolic Panel: Recent Labs  Lab 09/17/24 0225 09/19/24 0807 09/22/24 0358  NA 132* 133* 138  K 4.5 4.8 5.8*  CL 93* 94* 100  CO2 27 28 24   GLUCOSE 93 119* 86  BUN 49* 50* 67*  CREATININE 11.00* 10.58* 12.68*  CALCIUM 8.4* 8.7* 9.0  MG 2.1  --   --   PHOS 8.5* 8.5*  --    GFR: Estimated Creatinine Clearance: 7.2 mL/min (A) (by C-G formula based on SCr of 12.68 mg/dL (H)). Liver Function Tests: Recent Labs  Lab 09/19/24 0807  ALBUMIN  3.0*   No results for input(s): LIPASE, AMYLASE in the last 168 hours. No results for input(s): AMMONIA in the last 168 hours. Coagulation Profile: No results for input(s): INR, PROTIME in the last 168 hours. Cardiac Enzymes: Recent Labs  Lab 09/17/24 0225  CKTOTAL 87   BNP (last 3 results) No results for input(s): PROBNP in the last 8760 hours. HbA1C: No results for input(s): HGBA1C in the last 72 hours. CBG: No results for input(s): GLUCAP in the last 168 hours. Lipid Profile: No results for input(s): CHOL, HDL, LDLCALC, TRIG, CHOLHDL, LDLDIRECT in the last 72 hours. Thyroid Function Tests: No results for input(s): TSH, T4TOTAL, FREET4, T3FREE, THYROIDAB in the last 72 hours. Anemia Panel: No results for input(s): VITAMINB12, FOLATE, FERRITIN, TIBC, IRON, RETICCTPCT in the last 72 hours. Sepsis Labs: No results for input(s): PROCALCITON, LATICACIDVEN in the last 168 hours.  Recent Results (from the past 240 hours)  Surgical pcr screen     Status: None   Collection Time: 09/12/24 12:59 PM   Specimen: Nasal Mucosa; Nasal Swab  Result Value Ref Range Status   MRSA, PCR NEGATIVE NEGATIVE Final   Staphylococcus aureus NEGATIVE NEGATIVE  Final    Comment: (NOTE) The Xpert SA Assay (FDA approved for NASAL specimens in patients 17 years of age and older), is one component of a comprehensive surveillance program. It is not intended to diagnose infection nor to guide or monitor treatment. Performed at [facility], [address], [place], [place] [address]   Aerobic/Anaerobic Culture w Gram Stain (surgical/deep wound)     Status: None   Collection Time: 09/12/24  3:41 PM   Specimen: Path fluid; Body Fluid  Result Value Ref Range Status   Specimen Description WOUND RIGHT ARM  Final   Special Requests SWABS  Final   Gram Stain NO WBC SEEN NO ORGANISMS SEEN   Final   Culture   Final    ABUNDANT STAPHYLOCOCCUS EPIDERMIDIS  NO ANAEROBES ISOLATED Performed at [facility], [address] [place], [place] [address]    Report Status 09/17/2024 FINAL  Final   Organism ID, Bacteria STAPHYLOCOCCUS EPIDERMIDIS  Final      Susceptibility   Staphylococcus epidermidis - MIC*    CIPROFLOXACIN <=0.5 SENSITIVE Sensitive     ERYTHROMYCIN <=0.25 SENSITIVE Sensitive     GENTAMICIN <=0.5 SENSITIVE Sensitive     OXACILLIN >=4 RESISTANT Resistant     TETRACYCLINE <=1 SENSITIVE Sensitive     VANCOMYCIN 1 SENSITIVE Sensitive     TRIMETH /SULFA  80 RESISTANT Resistant     CLINDAMYCIN <=0.25 SENSITIVE Sensitive     RIFAMPIN <=0.5 SENSITIVE Sensitive     Inducible Clindamycin NEGATIVE Sensitive     * ABUNDANT STAPHYLOCOCCUS EPIDERMIDIS  Culture, blood (Routine X 2) w Reflex to ID Panel     Status: None   Collection Time: 09/16/24  3:00 PM   Specimen: BLOOD  Result Value Ref Range Status   Specimen Description BLOOD SITE NOT SPECIFIED  Final   Special Requests   Final    BOTTLES DRAWN AEROBIC AND ANAEROBIC Blood Culture adequate volume   Culture   Final    NO GROWTH 5 DAYS Performed at [facility], [address] [place], [place] [address]    Report Status 09/21/2024 FINAL  Final  Culture, blood (Routine X 2) w  Reflex to ID Panel     Status: None   Collection Time: 09/16/24  3:12 PM   Specimen: BLOOD LEFT HAND  Result Value Ref Range Status   Specimen Description BLOOD LEFT HAND  Final   Special Requests   Final    BOTTLES DRAWN AEROBIC AND ANAEROBIC Blood Culture adequate volume   Culture   Final    NO GROWTH 5 DAYS Performed at [facility], [address], [place], [place] [address]    Report Status 09/21/2024 FINAL  Final         Radiology Studies: No results found.         LOS: 10 days   Time spent= 35 mins    Burgess JAYSON Dare, MD [facility]  If 7PM-7AM, please contact night-coverage  09/22/2024, 12:41 PM

## 2024-09-22 NOTE — Anesthesia Preprocedure Evaluation (Signed)
 Anesthesia Evaluation  Patient identified by MRN, date of birth, ID band Patient awake    Reviewed: Allergy & Precautions, NPO status , Patient's Chart, lab work & pertinent test results  History of Anesthesia Complications Negative for: history of anesthetic complications  Airway Mallampati: III  TM Distance: >3 FB Neck ROM: Full   Comment: Previous grade IV view with MAC 4, easy mask Dental  (+) Dental Advisory Given   Pulmonary neg shortness of breath, asthma , neg sleep apnea, neg COPD, neg recent URI Nodule    Pulmonary exam normal breath sounds clear to auscultation       Cardiovascular hypertension (amlodipine , labetalol ), Pt. on medications and Pt. on home beta blockers (-) CAD, (-) Past MI, (-) Cardiac Stents and (-) CABG (-) dysrhythmias  Rhythm:Regular Rate:Normal  TTE 09/16/2024: IMPRESSIONS    1. Left ventricular ejection fraction, by estimation, is 60 to 65%. The  left ventricle has normal function. The left ventricle has no regional  wall motion abnormalities. There is mild concentric left ventricular  hypertrophy. Left ventricular diastolic  parameters were normal.   2. Right ventricular systolic function is normal. The right ventricular  size is normal.   3. A small pericardial effusion is present. The pericardial effusion is  circumferential.   4. The mitral valve is normal in structure. Trivial mitral valve  regurgitation. No evidence of mitral stenosis.   5. The aortic valve is normal in structure. Aortic valve regurgitation is  not visualized. No aortic stenosis is present.   6. The inferior vena cava is normal in size with greater than 50%  respiratory variability, suggesting right atrial pressure of 3 mmHg.     Neuro/Psych negative neurological ROS     GI/Hepatic Neg liver ROS,GERD  ,,  Endo/Other    Renal/GU ESRF and DialysisRenal disease (last HD yesterday)     Musculoskeletal    Abdominal   Peds  Hematology  (+) Blood dyscrasia, anemia Lab Results      Component                Value               Date                      WBC                      7.5                 09/22/2024                HGB                      9.0 (L)             09/22/2024                HCT                      28.1 (L)            09/22/2024                MCV                      86.7                09/22/2024  PLT                      198                 09/22/2024              Anesthesia Other Findings 43 year old man with ESRD on HD, HTN, SLE on hydroxychloroquine , GERD was admitted infected nonfunctional right arm AV graft.  Patient underwent excision of the RUE AV graft with drainage and vein angioplasty.  Now with MRSA bacteremia with concern for endocarditis.  Reproductive/Obstetrics                              Anesthesia Physical Anesthesia Plan  ASA: 3  Anesthesia Plan: MAC   Post-op Pain Management: Minimal or no pain anticipated   Induction: Intravenous  PONV Risk Score and Plan: 1 and Propofol  infusion, TIVA and Treatment may vary due to age or medical condition  Airway Management Planned: Natural Airway and Nasal Cannula  Additional Equipment:   Intra-op Plan:   Post-operative Plan:   Informed Consent: I have reviewed the patients History and Physical, chart, labs and discussed the procedure including the risks, benefits and alternatives for the proposed anesthesia with the patient or authorized representative who has indicated his/her understanding and acceptance.     Dental advisory given  Plan Discussed with: CRNA and Anesthesiologist  Anesthesia Plan Comments: (Discussed with patient risks of MAC including, but not limited to, minor pain or discomfort, hearing people in the room, and possible need for backup general anesthesia. Risks for general anesthesia also discussed including, but not limited to, sore throat,  hoarse voice, chipped/damaged teeth, injury to vocal cords, nausea and vomiting, allergic reactions, lung infection, heart attack, stroke, and death. All questions answered. )         Anesthesia Quick Evaluation

## 2024-09-22 NOTE — Progress Notes (Signed)
 PHARMACY CONSULT NOTE FOR:  OUTPATIENT  PARENTERAL ANTIBIOTIC THERAPY (OPAT)  Informational as the patient will receive antibiotics outpatient at the hemodialysis center  Indication: MRSE bacteremia + AVG infection Regimen: Daptomycin  500 mg/HD-MWF End date: 10/14/24 (4 weeks from neg BCx 12/9)  IV antibiotic discharge orders are pended. To discharging provider:  please sign these orders via discharge navigator,  Select New Orders & click on the button choice - Manage This Unsigned Work.     Thank you for allowing pharmacy to be a part of this patients care.  Almarie Lunger, PharmD, BCPS, BCIDP Infectious Diseases Clinical Pharmacist 09/23/2024 3:45 PM   **Pharmacist phone directory can now be found on [email or web address] ([email or web address]).  Listed under [facility].

## 2024-09-23 ENCOUNTER — Inpatient Hospital Stay (HOSPITAL_COMMUNITY): Payer: Self-pay | Admitting: Anesthesiology

## 2024-09-23 ENCOUNTER — Inpatient Hospital Stay (HOSPITAL_COMMUNITY)

## 2024-09-23 ENCOUNTER — Encounter (HOSPITAL_COMMUNITY): Admission: EM | Disposition: A | Payer: Self-pay | Source: Home / Self Care | Attending: Internal Medicine

## 2024-09-23 ENCOUNTER — Encounter (HOSPITAL_COMMUNITY): Payer: Self-pay | Admitting: Cardiovascular Disease

## 2024-09-23 DIAGNOSIS — N186 End stage renal disease: Secondary | ICD-10-CM

## 2024-09-23 DIAGNOSIS — Z992 Dependence on renal dialysis: Secondary | ICD-10-CM

## 2024-09-23 DIAGNOSIS — I120 Hypertensive chronic kidney disease with stage 5 chronic kidney disease or end stage renal disease: Secondary | ICD-10-CM

## 2024-09-23 DIAGNOSIS — R7881 Bacteremia: Secondary | ICD-10-CM

## 2024-09-23 HISTORY — PX: TRANSESOPHAGEAL ECHOCARDIOGRAM (CATH LAB): EP1270

## 2024-09-23 LAB — BASIC METABOLIC PANEL WITH GFR
Anion gap: 9 (ref 5–15)
BUN: 40 mg/dL — ABNORMAL HIGH (ref 6–20)
CO2: 31 mmol/L (ref 22–32)
Calcium: 9.1 mg/dL (ref 8.9–10.3)
Chloride: 94 mmol/L — ABNORMAL LOW (ref 98–111)
Creatinine, Ser: 8.39 mg/dL — ABNORMAL HIGH (ref 0.61–1.24)
GFR, Estimated: 8 mL/min — ABNORMAL LOW (ref >60)
Glucose, Bld: 82 mg/dL (ref 70–99)
Potassium: 5.5 mmol/L — ABNORMAL HIGH (ref 3.5–5.1)
Sodium: 134 mmol/L — ABNORMAL LOW (ref 135–145)

## 2024-09-23 LAB — CBC
HCT: 30.5 % — ABNORMAL LOW (ref 39.0–52.0)
Hemoglobin: 9.8 g/dL — ABNORMAL LOW (ref 13.0–17.0)
MCH: 28.2 pg (ref 26.0–34.0)
MCHC: 32.1 g/dL (ref 30.0–36.0)
MCV: 87.9 fL (ref 80.0–100.0)
Platelets: 206 K/uL (ref 150–400)
RBC: 3.47 MIL/uL — ABNORMAL LOW (ref 4.22–5.81)
RDW: 13.2 % (ref 11.5–15.5)
WBC: 6.4 K/uL (ref 4.0–10.5)
nRBC: 0 % (ref 0.0–0.2)

## 2024-09-23 LAB — ECHO TEE

## 2024-09-23 LAB — MAGNESIUM: Magnesium: 2.1 mg/dL (ref 1.7–2.4)

## 2024-09-23 SURGERY — TRANSESOPHAGEAL ECHOCARDIOGRAM (TEE) (CATHLAB)
Anesthesia: Monitor Anesthesia Care

## 2024-09-23 MED ORDER — LIDOCAINE 2% (20 MG/ML) 5 ML SYRINGE
INTRAMUSCULAR | Status: DC | PRN
  Administered 2024-09-23: 08:00:00 80 mg via INTRAVENOUS

## 2024-09-23 MED ORDER — DAPTOMYCIN-SODIUM CHLORIDE 500-0.9 MG/50ML-% IV SOLN: 500.0000 mg | INTRAVENOUS | Status: DC

## 2024-09-23 MED ORDER — SODIUM CHLORIDE 0.9 % IV SOLN: INTRAVENOUS | Status: DC | PRN

## 2024-09-23 MED ORDER — SODIUM CHLORIDE 0.9 % IV SOLN
400.0000 mg | Freq: Once | INTRAVENOUS | Status: AC
  Administered 2024-09-23: 18:00:00 400 mg via INTRAVENOUS
  Filled 2024-09-23: qty 8

## 2024-09-23 MED ORDER — PROPOFOL 500 MG/50ML IV EMUL
INTRAVENOUS | Status: DC | PRN
  Administered 2024-09-23: 08:00:00 120 ug/kg/min via INTRAVENOUS

## 2024-09-23 MED ORDER — SODIUM ZIRCONIUM CYCLOSILICATE 10 G PO PACK
10.0000 g | PACK | Freq: Two times a day (BID) | ORAL | Status: AC
  Administered 2024-09-23 (×2): 10 g via ORAL
  Filled 2024-09-23 (×2): qty 1

## 2024-09-23 MED ORDER — PROPOFOL 10 MG/ML IV BOLUS
INTRAVENOUS | Status: DC | PRN
  Administered 2024-09-23: 08:00:00 50 mg via INTRAVENOUS
  Administered 2024-09-23: 08:00:00 20 mg via INTRAVENOUS

## 2024-09-23 NOTE — Progress Notes (Signed)
 Pharmacy Antibiotic Note  Jorge [name] is a 44 y.o. male admitted on 09/11/2024 with MRSE bacteremia + AVG infection.  Pharmacy has been consulted for Daptomycin  dosing.  The patient is ESRD-MWF - last hemodialysis on Mon, 12/15 however Daptomycin  was not charted as given that evening.  Plan: - Daptomycin  400 mg IV x 1 (~6 mg/kg) to make up for missed dose on 12/15 - Resume Daptomycin  500 mg after HD MWF @ 1800 starting on Wed, 12/17 - Will continue to follow HD schedule/duration, culture results, LOT, and antibiotic de-escalation plans   Height: 5' 8 (172.7 cm) Weight: 66.8 kg (147 lb 4.3 oz) IBW/kg (Calculated) : 68.4  Temp (24hrs), Avg:97.9 F (36.6 C), Min:97.7 F (36.5 C), Max:98.4 F (36.9 C)  Recent Labs  Lab 09/17/24 0225 09/17/24 0825 09/19/24 0437 09/19/24 0807 09/22/24 0358 09/23/24 0444  WBC  --  5.0 5.2 5.1 7.5 6.4  CREATININE 11.00*  --   --  10.58* 12.68* 8.39*    Estimated Creatinine Clearance: 10.9 mL/min (A) (by C-G formula based on SCr of 8.39 mg/dL (H)).    Allergies  Allergen Reactions   Vancomycin Hives   Bactoshield Chg [Chlorhexidine  Gluconate] Itching    Patient reports irritation w/ CHG, previously said scratched until bled on buttocks   Esomeprazole Rash    Antimicrobials this admission: Ceftriaxone  12/4 x 1 dose Linezolid  12/5 >> (12/10) Cefepime  12/5 >> 12/8 Daptomycin  12/10 >>  Dose adjustments this admission:   Microbiology results: 12/4 BCx >> 2/2 MRSE 12/5 WCx >> MRSE  Thank you for allowing pharmacy to be a part of this patients care.  Almarie Lunger, PharmD, BCPS, BCIDP Infectious Diseases Clinical Pharmacist 09/23/2024 3:46 PM   **Pharmacist phone directory can now be found on [email or web address] ([email or web address]).  Listed under [facility].

## 2024-09-23 NOTE — Progress Notes (Signed)
 [facility]  Date of Admission:  09/11/2024     Principal Problem:   Staphylococcus epidermidis bacteremia Active Problems:   Hyperkalemia   Hypertensive urgency   ESRD on hemodialysis (HCC)   Right arm cellulitis   Arteriovenous graft infection          Assessment: # MRSE Bacteremia likely 2/2  # Infected AVG  12/5 s/p excision of right upper arm AV graft with drainage of seroma with vein patch angioplasty of right brachial artery with brachial vein.  OR culture with MRSE, matching sensitivity with MRSE in  blood  - Sens from blood 1/1 set and avg match -TTE and TEE without vegetation  Recommendations: -Continue daptomycin , plan on 4 weeks antibiotics from negative blood cultures on 12/9 with HD, eot 1/5/285 - Patient had new HD catheter placed on 12/5, repeat blood cultures obtained on 12/9, vascular.  Patient had undergone AVG excision on 12/5 as well.  Given cultures were not clear and pt only recived one dose of linezolid  prior to new line placement would recommend HD cath removal. Ok to exchange, given CONS.  Discussed with pt. Relayed plan to primay -10/06/24 f/u with ID   Microbiology:   Antibiotics: Cefepime  12/4 - 12/8 Linezolid  12/5 - 12/10 Daptomycin  12/10-present  Cultures: Blood 12/4 1 out of 1 sets MRSE 12/9 no growth Urine  Other  12/5 wound cultures MRSE   SUBJECTIVE: Afebrile overnight.  Resting in bed. Interval:   Review of Systems: Review of Systems  All other systems reviewed and are negative.    Scheduled Meds:  amLODipine   10 mg Oral q morning   Chlorhexidine  Gluconate Cloth  6 each Topical Q0600   Chlorhexidine  Gluconate Cloth  6 each Topical Q0600   darbepoetin (ARANESP ) injection - DIALYSIS  40 mcg Subcutaneous Q Fri-1800   heparin   5,000 Units Subcutaneous Q8H   hydroxychloroquine   200 mg Oral BH-q7a   labetalol   100 mg Oral BID   montelukast   10 mg Oral Daily   sevelamer  carbonate  1,600  mg Oral TID WC   sodium zirconium cyclosilicate   10 g Oral BID   Continuous Infusions:  DAPTOmycin  500 mg (09/19/24 1813)   PRN Meds:.acetaminophen  **OR** acetaminophen , hydrALAZINE , ipratropium-albuterol , metoprolol  tartrate, morphine  injection, ondansetron  **OR** ondansetron  (ZOFRAN ) IV, traZODone  Allergies[1]  OBJECTIVE: Vitals:   09/23/24 0819 09/23/24 0829 09/23/24 0839 09/23/24 0920  BP: 136/66 133/69 (!) 136/97 (!) 160/87  Pulse: 94 94  82  Resp: 20 20  19   Temp: 97.7 F (36.5 C)   97.7 F (36.5 C)  TempSrc: Tympanic   Oral  SpO2: 93% 95%  97%  Weight:      Height:       Body mass index is 22.39 kg/m.  Physical Exam Constitutional:      General: He is not in acute distress.    Appearance: He is normal weight. He is not toxic-appearing.  HENT:     Head: Normocephalic and atraumatic.     Right Ear: External ear normal.     Left Ear: External ear normal.     Nose: No congestion or rhinorrhea.     Mouth/Throat:     Mouth: Mucous membranes are moist.     Pharynx: Oropharynx is clear.  Eyes:     Extraocular Movements: Extraocular movements intact.     Conjunctiva/sclera: Conjunctivae normal.     Pupils: Pupils are equal, round, and reactive to light.  Cardiovascular:  Rate and Rhythm: Normal rate and regular rhythm.     Heart sounds: No murmur heard.    No friction rub. No gallop.  Pulmonary:     Effort: Pulmonary effort is normal.     Breath sounds: Normal breath sounds.  Abdominal:     General: Abdomen is flat. Bowel sounds are normal.     Palpations: Abdomen is soft.  Musculoskeletal:        General: No swelling. Normal range of motion.     Cervical back: Normal range of motion and neck supple.  Skin:    General: Skin is warm and dry.  Neurological:     General: No focal deficit present.     Mental Status: He is oriented to person, place, and time.  Psychiatric:        Mood and Affect: Mood normal.    AVG wound   Lab Results Lab Results   Component Value Date   WBC 6.4 09/23/2024   HGB 9.8 (L) 09/23/2024   HCT 30.5 (L) 09/23/2024   MCV 87.9 09/23/2024   PLT 206 09/23/2024    Lab Results  Component Value Date   CREATININE 8.39 (H) 09/23/2024   BUN 40 (H) 09/23/2024   NA 134 (L) 09/23/2024   K 5.5 (H) 09/23/2024   CL 94 (L) 09/23/2024   CO2 31 09/23/2024    Lab Results  Component Value Date   ALT 9 09/13/2024   AST 13 (L) 09/13/2024   ALKPHOS 50 09/13/2024   BILITOT 0.3 09/13/2024        Loney Stank, MD [facility] 09/23/2024, 1:32 PM     [1]  Allergies Allergen Reactions   Vancomycin Hives   Bactoshield Chg [Chlorhexidine  Gluconate] Itching    Patient reports irritation w/ CHG, previously said scratched until bled on buttocks   Esomeprazole Rash

## 2024-09-23 NOTE — Anesthesia Postprocedure Evaluation (Signed)
 Anesthesia Post Note  Patient: Jorge [name]  Procedure(s) Performed: TRANSESOPHAGEAL ECHOCARDIOGRAM     Patient location during evaluation: PACU Anesthesia Type: MAC Level of consciousness: awake Pain management: pain level controlled Vital Signs Assessment: post-procedure vital signs reviewed and stable Respiratory status: spontaneous breathing, nonlabored ventilation and respiratory function stable Cardiovascular status: stable and blood pressure returned to baseline Postop Assessment: no apparent nausea or vomiting Anesthetic complications: no   No notable events documented.  Last Vitals:  Vitals:   09/23/24 0839 09/23/24 0920  BP: (!) 136/97 (!) 160/87  Pulse:  82  Resp:  19  Temp:  36.5 C  SpO2:  97%    Last Pain:  Vitals:   09/23/24 0920  TempSrc: Oral  PainSc:                  Jorge [name]

## 2024-09-23 NOTE — Interval H&P Note (Signed)
 History and Physical Interval Note:  09/23/2024 7:38 AM  Jorge [name]  has presented today for surgery, with the diagnosis of bacteremia.  The various methods of treatment have been discussed with the patient and family. After consideration of risks, benefits and other options for treatment, the patient has consented to  Procedures: TRANSESOPHAGEAL ECHOCARDIOGRAM (N/A) as a surgical intervention.  The patient's history has been reviewed, patient examined, no change in status, stable for surgery.  I have reviewed the patient's chart and labs.  Questions were answered to the patient's satisfaction.    Clinically dysphagia is better. Will use mini probe instead of normal adult probe If unable to pass will arrange GI consult and combined procedure at latter date Risk of esophageal injury discussed with patient    [name]

## 2024-09-23 NOTE — Plan of Care (Signed)
?  Problem: Skin Integrity: °Goal: Skin integrity will improve °Outcome: Progressing °  °Problem: Skin Integrity: °Goal: Skin integrity will improve °Outcome: Progressing °  °

## 2024-09-23 NOTE — Transfer of Care (Signed)
 Immediate Anesthesia Transfer of Care Note  Patient: Jorge [name]  Procedure(s) Performed: TRANSESOPHAGEAL ECHOCARDIOGRAM  Patient Location: Cath Lab  Anesthesia Type:MAC  Level of Consciousness: awake  Airway & Oxygen Therapy: Patient Spontanous Breathing  Post-op Assessment: Report given to RN and Post -op Vital signs reviewed and stable  Post vital signs: Reviewed and stable  Last Vitals:  Vitals Value Taken Time  BP 136/66 09/23/24 08:19  Temp 36.5 C 09/23/24 08:19  Pulse 95 09/23/24 08:23  Resp 23 09/23/24 08:23  SpO2 90 % 09/23/24 08:23  Vitals shown include unfiled device data.  Last Pain:  Vitals:   09/23/24 0819  TempSrc: Tympanic  PainSc: Asleep      Patients Stated Pain Goal: 0 (09/22/24 9372)  Complications: No notable events documented.

## 2024-09-23 NOTE — CV Procedure (Signed)
 TEE: Anesthesia: Propofol  Given patients history of esophageal stricture the Philips mini probe was used It passed easily The patient had significant bronchospasm and apnea No aspiration Procedure terminated early. Sats never dropped  Mild AV thickening  Normal MV Normal TV Normal PV No SBE vegetations Dialysis catheter in RA EF 60%  Normal RV No PFO/ASD  See full report in [employer]  [name] MD [facility]

## 2024-09-23 NOTE — Progress Notes (Signed)
 [facility] Progress Note   Subjective:    Seen and examined patient at bedside. He denies any acute issues. Blood cultures from 12/12 (+) staphylococcus epidermidis. Per ID, patient does not need a line holiday but a HD catheter exchange instead. I re-consulted VVS and spoke with the VVS PA. Awaiting response from Dr. Sheree. Plan for HD tomorrow and will work treatment around upcoming catheter exchange. Current K+ 5.5 and Lokelma  X 2 doses already ordered. Re-checking labs in the AM.  Objective Vitals:   09/23/24 0819 09/23/24 0829 09/23/24 0839 09/23/24 0920  BP: 136/66 133/69 (!) 136/97 (!) 160/87  Pulse: 94 94  82  Resp: 20 20  19   Temp: 97.7 F (36.5 C)   97.7 F (36.5 C)  TempSrc: Tympanic   Oral  SpO2: 93% 95%  97%  Weight:      Height:       Physical Exam General: Alert, well appearing male in NAD Heart: RRR, no murmurs Lungs: CTA bilaterally, respirations unlabored on RA Abdomen: Soft, non-distended, +BS Extremities: No edema b/l lower extremities Dialysis Access:  [facility]   Filed Weights   09/19/24 0803 09/19/24 1219 09/22/24 0821  Weight: 63.9 kg 65.4 kg 66.8 kg    Intake/Output Summary (Last 24 hours) at 09/23/2024 1408 Last data filed at 09/23/2024 0900 Gross per 24 hour  Intake 390 ml  Output --  Net 390 ml    Additional Objective Labs: Basic Metabolic Panel: Recent Labs  Lab 09/17/24 0225 09/19/24 0807 09/22/24 0358 09/23/24 0444  NA 132* 133* 138 134*  K 4.5 4.8 5.8* 5.5*  CL 93* 94* 100 94*  CO2 27 28 24 31   GLUCOSE 93 119* 86 82  BUN 49* 50* 67* 40*  CREATININE 11.00* 10.58* 12.68* 8.39*  CALCIUM 8.4* 8.7* 9.0 9.1  PHOS 8.5* 8.5*  --   --    Liver Function Tests: Recent Labs  Lab 09/19/24 0807  ALBUMIN  3.0*   No results for input(s): LIPASE, AMYLASE in the last 168 hours. CBC: Recent Labs  Lab 09/17/24 0825 09/19/24 0437 09/19/24 0807 09/22/24 0358 09/23/24 0444  WBC 5.0 5.2 5.1 7.5 6.4  HGB 9.1* 9.4* 8.9*  9.0* 9.8*  HCT 29.3* 29.4* 28.5* 28.1* 30.5*  MCV 88.3 87.5 88.2 86.7 87.9  PLT 234 217 222 198 206   Blood Culture    Component Value Date/Time   SDES BLOOD LEFT HAND 09/16/2024 1512   SPECREQUEST  09/16/2024 1512    BOTTLES DRAWN AEROBIC AND ANAEROBIC Blood Culture adequate volume   CULT  09/16/2024 1512    NO GROWTH 5 DAYS Performed at [facility], [address] [place], [place] [address]    REPTSTATUS 09/21/2024 FINAL 09/16/2024 1512    Cardiac Enzymes: Recent Labs  Lab 09/17/24 0225  CKTOTAL 87   CBG: No results for input(s): GLUCAP in the last 168 hours. Iron Studies: No results for input(s): IRON, TIBC, TRANSFERRIN, FERRITIN in the last 72 hours. Lab Results  Component Value Date   INR 1.1 01/14/2024   Studies/Results: ECHO TEE Result Date: 09/23/2024    TRANSESOPHOGEAL ECHO REPORT   Patient Name:   Jorge [name] Date of Exam: 09/23/2024 Medical Rec #:  [id number]       Height:       68.0 in Accession #:    [id number]      Weight:       147.3 lb Date of Birth:  1981-07-05       BSA:  1.794 m Patient Age:    43 years        BP:           136/97 mmHg Patient Gender: M               HR:           84 bpm. Exam Location:  Inpatient Procedure: Transesophageal Echo, Color Doppler and Cardiac Doppler (Both            Spectral and Color Flow Doppler were utilized during procedure). Indications:     Endocarditis  History:         Patient has prior history of Echocardiogram examinations.                  Endocarditis; Signs/Symptoms:Bacteremia.  Sonographer:     Koleen Popper RDCS Referring Phys:  [id number] MAUDE JAYSON EMMER Diagnosing Phys: [name] MD PROCEDURE: After discussion of the risks and benefits of a TEE, an informed consent was obtained from the patient. The transesophogeal probe was passed without difficulty through the esophogus of the patient. Imaged were obtained with the patient in a left lateral decubitus position. Sedation performed by  different physician. The patient was monitored while under deep sedation. Anesthestetic sedation was provided intravenously by Anesthesiology: 135mg  of Propofol , 80mg  of Lidocaine . Image quality was technically difficult. The patient's vital signs; including heart rate, blood pressure, and oxygen saturation; remained stable throughout the procedure. The patient developed no complications during the procedure. Transgastric views not obtained: patient did not tolerate.  IMPRESSIONS  1. Philips mini probe used due to history of strictures. No problems passing probe Study terminated early due to apnea and bronchospasm Sats never dropped Bronchospasm and apnea resolved after probe removed.  2. Left ventricular ejection fraction, by estimation, is 60 to 65%. The left ventricle has normal function. The left ventricle has no regional wall motion abnormalities.  3. Right ventricular systolic function is normal. The right ventricular size is normal.  4. No left atrial/left atrial appendage thrombus was detected.  5. The mitral valve is normal in structure. No evidence of mitral valve regurgitation. No evidence of mitral stenosis.  6. The aortic valve is normal in structure. Aortic valve regurgitation is not visualized. No aortic stenosis is present.  7. The inferior vena cava is normal in size with greater than 50% respiratory variability, suggesting right atrial pressure of 3 mmHg.  8. None and demonstrates None performed. Conclusion(s)/Recommendation(s): Normal biventricular function without evidence of hemodynamically significant valvular heart disease. FINDINGS  Left Ventricle: Left ventricular ejection fraction, by estimation, is 60 to 65%. The left ventricle has normal function. The left ventricle has no regional wall motion abnormalities. The left ventricular internal cavity size was normal in size. There is  no left ventricular hypertrophy. Right Ventricle: The right ventricular size is normal. No increase in right  ventricular wall thickness. Right ventricular systolic function is normal. Left Atrium: Left atrial size was normal in size. No left atrial/left atrial appendage thrombus was detected. Right Atrium: Right atrial size was normal in size. Pericardium: There is no evidence of pericardial effusion. Mitral Valve: The mitral valve is normal in structure. No evidence of mitral valve regurgitation. No evidence of mitral valve stenosis. Tricuspid Valve: The tricuspid valve is normal in structure. Tricuspid valve regurgitation is not demonstrated. No evidence of tricuspid stenosis. Aortic Valve: The aortic valve is normal in structure. Aortic valve regurgitation is not visualized. No aortic stenosis is present. Pulmonic Valve: The pulmonic valve  was normal in structure. Pulmonic valve regurgitation is not visualized. No evidence of pulmonic stenosis. Aorta: The aortic root is normal in size and structure. Venous: The inferior vena cava is normal in size with greater than 50% respiratory variability, suggesting right atrial pressure of 3 mmHg. IAS/Shunts: No atrial level shunt detected by color flow Doppler. Additional Comments: Philips mini probe used due to history of strictures. No problems passing probe Study terminated early due to apnea and bronchospasm Sats never dropped Bronchospasm and apnea resolved after probe removed. 3D was performed not requiring image post processing on an independent workstation and was indeterminate. LEFT VENTRICLE PLAX 2D LVOT diam:     1.90 cm LVOT Area:     2.84 cm   AORTA Ao Root diam: 2.90 cm  SHUNTS Systemic Diam: 1.90 cm [name] MD Electronically signed by [name] MD Signature Date/Time: 09/23/2024/9:02:23 AM    Final    EP STUDY Result Date: 09/23/2024 See surgical note for result.   Medications:  DAPTOmycin  500 mg (09/19/24 1813)    amLODipine   10 mg Oral q morning   Chlorhexidine  Gluconate Cloth  6 each Topical Q0600   Chlorhexidine  Gluconate Cloth  6 each  Topical Q0600   darbepoetin (ARANESP ) injection - DIALYSIS  40 mcg Subcutaneous Q Fri-1800   heparin   5,000 Units Subcutaneous Q8H   hydroxychloroquine   200 mg Oral BH-q7a   labetalol   100 mg Oral BID   montelukast   10 mg Oral Daily   sevelamer  carbonate  1,600 mg Oral TID WC   sodium zirconium cyclosilicate   10 g Oral BID    Dialysis Orders: [facility] M[facility] [address]  [facility]   Assessment/Plan: 1. AVG infection/ MRSE bacteremia - excision of RU AVG and seroma with vein patch angioplasty by Dr. Gretta. Blood cx's from 12/12 (+) staphylococcus epidermidis. Followed by ID. TEE was negative and his outpatient HD clinic has 10 Daptomycon doses ready. Since TEE is negative, this will be enough supply to complete 4-week course end date 10/13/24. Per ID, patient will NOT need a line holiday but a HD catheter exchange instead. Re-consulted VVS. Plan for catheter exchange tomorrow 12pm so will schedule HD 1st shift tomorrow. 2. ESRD -on HD MWF.  Tolerating HD without issues. Next HD 12/17.  3. Hyperkalemia - Current K+ 5.5 and Lokelma  X 2 doses already ordered. Re-checking labs in AM. See above. 4. Anemia of CKD- Hgb 8.9. Started aranesp .  5. HTN/volume - Blood pressure in goal. Continue home meds. 6. Nutrition -  Renal diet w/fluid restrictions. Reviewed low K diet.   7. SLE - on plaquenil   Charmaine Piety, NP [facility] 09/23/2024,2:08 PM  LOS: 11 days

## 2024-09-23 NOTE — Plan of Care (Signed)
°  Problem: Clinical Measurements: Goal: Ability to avoid or minimize complications of infection will improve Outcome: Progressing   Problem: Skin Integrity: Goal: Skin integrity will improve Outcome: Progressing   Problem: Education: Goal: Knowledge of General Education information will improve Description: Including pain rating scale, medication(s)/side effects and non-pharmacologic comfort measures Outcome: Progressing   Problem: Health Behavior/Discharge Planning: Goal: Ability to manage health-related needs will improve Outcome: Progressing   Problem: Clinical Measurements: Goal: Ability to maintain clinical measurements within normal limits will improve Outcome: Progressing Goal: Will remain free from infection Outcome: Progressing Goal: Diagnostic test results will improve Outcome: Progressing Goal: Respiratory complications will improve Outcome: Progressing Goal: Cardiovascular complication will be avoided Outcome: Progressing   Problem: Activity: Goal: Risk for activity intolerance will decrease Outcome: Progressing   Problem: Nutrition: Goal: Adequate nutrition will be maintained Outcome: Progressing   Problem: Coping: Goal: Level of anxiety will decrease Outcome: Progressing   Problem: Elimination: Goal: Will not experience complications related to bowel motility Outcome: Progressing Goal: Will not experience complications related to urinary retention Outcome: Progressing   Problem: Pain Managment: Goal: General experience of comfort will improve and/or be controlled Outcome: Progressing   Problem: Safety: Goal: Ability to remain free from injury will improve Outcome: Progressing   Problem: Skin Integrity: Goal: Risk for impaired skin integrity will decrease Outcome: Progressing   Problem: Education: Goal: Knowledge of disease and its progression will improve Outcome: Progressing   Problem: Fluid Volume: Goal: Compliance with measures to  maintain balanced fluid volume will improve Outcome: Progressing   Problem: Health Behavior/Discharge Planning: Goal: Ability to manage health-related needs will improve Outcome: Progressing   Problem: Nutritional: Goal: Ability to make healthy dietary choices will improve Outcome: Progressing   Problem: Clinical Measurements: Goal: Complications related to the disease process, condition or treatment will be avoided or minimized Outcome: Progressing

## 2024-09-23 NOTE — Progress Notes (Signed)
 PROGRESS NOTE    Jorge [name]  FMW:[id number] DOB: 03/02/1981 DOA: 09/11/2024 PCP: Austin Mutton, MD    Brief Narrative:   43 year old man with ESRD on HD, HTN, SLE on hydroxychloroquine , GERD was admitted infected nonfunctional right arm AV graft.  Patient underwent excision of the RUE AV graft with drainage and vein angioplasty.  Now with MRSA bacteremia with concern for endocarditis.  Patient was treated with cefepime , ID consulting recommends daptomycin  to complete 6 weeks course however there has been difficulty obtaining daptomycin  at his hemodialysis center.  Patient history of vancomycin allergy.  TEE scheduled for 12/16 in case he cannot get 6 full weeks of daptomycin  at HD.  Following surveillance blood cultures.  Eventually echocardiogram was unremarkable.  Currently doing well.  Once cleared by ID, discharge with antibiotics.  EOT would be October 10, 2024.  Assessment & Plan:    Infected nonfunctional RUE AV graft s/p excision of graft MRSA bacteremia with concern for endocarditis Immunocompromised on Plaquenil  RUE AV graft was excised 12/5, s/p vein patch angioplasty, R brachial artery to brachial vein Graft growing out MRSA, blood cultures positive for MRSA 12/4 Surveillance blood cultures have been negative to date TEE is negative, 4 weeks of antibiotics should suffice.  Daptomycin  EOT 10/10/2024.  Will confirm with ID. May need HD catheter to be changed   ESRD on HD Hyperkalemia Followed by nephrology Give Lokelma  rest of it can be managed with dialysis   HTN On Norvasc , labetalol  IV as needed   SLE No evidence for acute flare Continue Plaquenil        DVT prophylaxis: Subcu heparin  Code Status: Full Family Communication: None today Status is: Inpatient Remains inpatient appropriate because:   PT Follow up Recs:   Subjective: Feeling well no complaints Tolerated TEE today.  Wishing to go home   Examination: General exam: Appears calm and comfortable   Respiratory system: Clear to auscultation. Respiratory effort normal. Cardiovascular system: S1 & S2 heard, RRR. No JVD, murmurs, rubs, gallops or clicks. No pedal edema. Gastrointestinal system: Abdomen is nondistended, soft and nontender. No organomegaly or masses felt. Normal bowel sounds heard. Central nervous system: Alert and oriented. No focal neurological deficits. Extremities: Symmetric 5 x 5 power. Skin: No rashes, lesions or ulcers Psychiatry: Judgement and insight appear normal. Mood & affect appropriate.                Diet Orders (From admission, onward)     Start     Ordered   09/23/24 0931  Diet renal with fluid restriction Fluid restriction: 1200 mL Fluid; Room service appropriate? Yes; Fluid consistency: Thin  Diet effective now       Question Answer Comment  Fluid restriction: 1200 mL Fluid   Room service appropriate? Yes   Fluid consistency: Thin      09/23/24 0931            Objective: Vitals:   09/23/24 0819 09/23/24 0829 09/23/24 0839 09/23/24 0920  BP: 136/66 133/69 (!) 136/97 (!) 160/87  Pulse: 94 94  82  Resp: 20 20  19   Temp: 97.7 F (36.5 C)   97.7 F (36.5 C)  TempSrc: Tympanic   Oral  SpO2: 93% 95%  97%  Weight:      Height:        Intake/Output Summary (Last 24 hours) at 09/23/2024 0959 Last data filed at 09/23/2024 0817 Gross per 24 hour  Intake 150 ml  Output 400 ml  Net -250 ml   Fredricka  Weights   09/19/24 0803 09/19/24 1219 09/22/24 0821  Weight: 63.9 kg 65.4 kg 66.8 kg    Scheduled Meds:  amLODipine   10 mg Oral q morning   Chlorhexidine  Gluconate Cloth  6 each Topical Q0600   Chlorhexidine  Gluconate Cloth  6 each Topical Q0600   darbepoetin (ARANESP ) injection - DIALYSIS  40 mcg Subcutaneous Q Fri-1800   heparin   5,000 Units Subcutaneous Q8H   hydroxychloroquine   200 mg Oral BH-q7a   labetalol   100 mg Oral BID   montelukast   10 mg Oral Daily   sevelamer  carbonate  1,600 mg Oral TID WC   sodium zirconium  cyclosilicate  10 g Oral BID   Continuous Infusions:  DAPTOmycin  500 mg (09/19/24 1813)    Nutritional status     Body mass index is 22.39 kg/m.  Data Reviewed:   CBC: Recent Labs  Lab 09/17/24 0825 09/19/24 0437 09/19/24 0807 09/22/24 0358 09/23/24 0444  WBC 5.0 5.2 5.1 7.5 6.4  HGB 9.1* 9.4* 8.9* 9.0* 9.8*  HCT 29.3* 29.4* 28.5* 28.1* 30.5*  MCV 88.3 87.5 88.2 86.7 87.9  PLT 234 217 222 198 206   Basic Metabolic Panel: Recent Labs  Lab 09/17/24 0225 09/19/24 0807 09/22/24 0358 09/23/24 0444  NA 132* 133* 138 134*  K 4.5 4.8 5.8* 5.5*  CL 93* 94* 100 94*  CO2 27 28 24 31   GLUCOSE 93 119* 86 82  BUN 49* 50* 67* 40*  CREATININE 11.00* 10.58* 12.68* 8.39*  CALCIUM 8.4* 8.7* 9.0 9.1  MG 2.1  --   --  2.1  PHOS 8.5* 8.5*  --   --    GFR: Estimated Creatinine Clearance: 10.9 mL/min (A) (by C-G formula based on SCr of 8.39 mg/dL (H)). Liver Function Tests: Recent Labs  Lab 09/19/24 0807  ALBUMIN  3.0*   No results for input(s): LIPASE, AMYLASE in the last 168 hours. No results for input(s): AMMONIA in the last 168 hours. Coagulation Profile: No results for input(s): INR, PROTIME in the last 168 hours. Cardiac Enzymes: Recent Labs  Lab 09/17/24 0225  CKTOTAL 87   BNP (last 3 results) No results for input(s): PROBNP in the last 8760 hours. HbA1C: No results for input(s): HGBA1C in the last 72 hours. CBG: No results for input(s): GLUCAP in the last 168 hours. Lipid Profile: No results for input(s): CHOL, HDL, LDLCALC, TRIG, CHOLHDL, LDLDIRECT in the last 72 hours. Thyroid Function Tests: No results for input(s): TSH, T4TOTAL, FREET4, T3FREE, THYROIDAB in the last 72 hours. Anemia Panel: No results for input(s): VITAMINB12, FOLATE, FERRITIN, TIBC, IRON, RETICCTPCT in the last 72 hours. Sepsis Labs: No results for input(s): PROCALCITON, LATICACIDVEN in the last 168 hours.  Recent Results (from the  past 240 hours)  Culture, blood (Routine X 2) w Reflex to ID Panel     Status: None   Collection Time: 09/16/24  3:00 PM   Specimen: BLOOD  Result Value Ref Range Status   Specimen Description BLOOD SITE NOT SPECIFIED  Final   Special Requests   Final    BOTTLES DRAWN AEROBIC AND ANAEROBIC Blood Culture adequate volume   Culture   Final    NO GROWTH 5 DAYS Performed at [facility], [address], [place], [place] [address]    Report Status 09/21/2024 FINAL  Final  Culture, blood (Routine X 2) w Reflex to ID Panel     Status: None   Collection Time: 09/16/24  3:12 PM   Specimen: BLOOD LEFT HAND  Result Value Ref Range Status   Specimen Description BLOOD LEFT HAND  Final   Special Requests   Final    BOTTLES DRAWN AEROBIC AND ANAEROBIC Blood Culture adequate volume   Culture   Final    NO GROWTH 5 DAYS Performed at [facility], [address] [place], [place] [address]    Report Status 09/21/2024 FINAL  Final         Radiology Studies: ECHO TEE Result Date: 09/23/2024    TRANSESOPHOGEAL ECHO REPORT   Patient Name:   Jorge [name] Date of Exam: 09/23/2024 Medical Rec #:  [id number]       Height:       68.0 in Accession #:    [id number]      Weight:       147.3 lb Date of Birth:  1981/09/14       BSA:          1.794 m Patient Age:    43 years        BP:           136/97 mmHg Patient Gender: M               HR:           84 bpm. Exam Location:  Inpatient Procedure: Transesophageal Echo, Color Doppler and Cardiac Doppler (Both            Spectral and Color Flow Doppler were utilized during procedure). Indications:     Endocarditis  History:         Patient has prior history of Echocardiogram examinations.                  Endocarditis; Signs/Symptoms:Bacteremia.  Sonographer:     Koleen Popper RDCS Referring Phys:  [id number] MAUDE JAYSON EMMER Diagnosing Phys: [name] MD PROCEDURE: After discussion of the risks and benefits of a TEE, an informed consent was obtained from the  patient. The transesophogeal probe was passed without difficulty through the esophogus of the patient. Imaged were obtained with the patient in a left lateral decubitus position. Sedation performed by different physician. The patient was monitored while under deep sedation. Anesthestetic sedation was provided intravenously by Anesthesiology: 135mg  of Propofol , 80mg  of Lidocaine . Image quality was technically difficult. The patient's vital signs; including heart rate, blood pressure, and oxygen saturation; remained stable throughout the procedure. The patient developed no complications during the procedure. Transgastric views not obtained: patient did not tolerate.  IMPRESSIONS  1. Philips mini probe used due to history of strictures. No problems passing probe Study terminated early due to apnea and bronchospasm Sats never dropped Bronchospasm and apnea resolved after probe removed.  2. Left ventricular ejection fraction, by estimation, is 60 to 65%. The left ventricle has normal function. The left ventricle has no regional wall motion abnormalities.  3. Right ventricular systolic function is normal. The right ventricular size is normal.  4. No left atrial/left atrial appendage thrombus was detected.  5. The mitral valve is normal in structure. No evidence of mitral valve regurgitation. No evidence of mitral stenosis.  6. The aortic valve is normal in structure. Aortic valve regurgitation is not visualized. No aortic stenosis is present.  7. The inferior vena cava is normal in size with greater than 50% respiratory variability, suggesting right atrial pressure of 3 mmHg.  8. None and demonstrates None performed. Conclusion(s)/Recommendation(s): Normal biventricular function without evidence of hemodynamically significant valvular heart disease. FINDINGS  Left Ventricle: Left ventricular  ejection fraction, by estimation, is 60 to 65%. The left ventricle has normal function. The left ventricle has no regional wall  motion abnormalities. The left ventricular internal cavity size was normal in size. There is  no left ventricular hypertrophy. Right Ventricle: The right ventricular size is normal. No increase in right ventricular wall thickness. Right ventricular systolic function is normal. Left Atrium: Left atrial size was normal in size. No left atrial/left atrial appendage thrombus was detected. Right Atrium: Right atrial size was normal in size. Pericardium: There is no evidence of pericardial effusion. Mitral Valve: The mitral valve is normal in structure. No evidence of mitral valve regurgitation. No evidence of mitral valve stenosis. Tricuspid Valve: The tricuspid valve is normal in structure. Tricuspid valve regurgitation is not demonstrated. No evidence of tricuspid stenosis. Aortic Valve: The aortic valve is normal in structure. Aortic valve regurgitation is not visualized. No aortic stenosis is present. Pulmonic Valve: The pulmonic valve was normal in structure. Pulmonic valve regurgitation is not visualized. No evidence of pulmonic stenosis. Aorta: The aortic root is normal in size and structure. Venous: The inferior vena cava is normal in size with greater than 50% respiratory variability, suggesting right atrial pressure of 3 mmHg. IAS/Shunts: No atrial level shunt detected by color flow Doppler. Additional Comments: Philips mini probe used due to history of strictures. No problems passing probe Study terminated early due to apnea and bronchospasm Sats never dropped Bronchospasm and apnea resolved after probe removed. 3D was performed not requiring image post processing on an independent workstation and was indeterminate. LEFT VENTRICLE PLAX 2D LVOT diam:     1.90 cm LVOT Area:     2.84 cm   AORTA Ao Root diam: 2.90 cm  SHUNTS Systemic Diam: 1.90 cm [name] MD Electronically signed by [name] MD Signature Date/Time: 09/23/2024/9:02:23 AM    Final    EP STUDY Result Date: 09/23/2024 See surgical note  for result.          LOS: 11 days   Time spent= 35 mins    [place]urgess JAYSON Dare, MD [facility]  If 7PM-7AM, please contact night-coverage  09/23/2024, 9:59 AM

## 2024-09-23 NOTE — Progress Notes (Signed)
°  Echocardiogram Echocardiogram Transesophageal has been performed.  Jorge [name], RDCS 09/23/2024, 8:41 AM

## 2024-09-23 NOTE — Progress Notes (Addendum)
°  Progress Note    09/23/2024 12:44 PM   Patient status post excision of right arm AV graft with vein patch of the brachial artery secondary to abscess by Dr. Gretta on 12/5 Blood cultures have been negative to date He has remained inpatient for MRSA bacteremia with concern of endocarditis Per ID is okay for catheter exchange.   Teretha Damme, [facility] [phone] 09/23/2024 12:44 PM   [facility] exchange tomorrow in the Cath Lab with Dr. Lanis and he will be n.p.o. past midnight.  [place], MD

## 2024-09-24 ENCOUNTER — Encounter (HOSPITAL_COMMUNITY): Payer: Self-pay | Admitting: Vascular Surgery

## 2024-09-24 ENCOUNTER — Encounter (HOSPITAL_COMMUNITY): Admission: EM | Disposition: A | Payer: Self-pay | Source: Home / Self Care | Attending: Internal Medicine

## 2024-09-24 DIAGNOSIS — R7881 Bacteremia: Secondary | ICD-10-CM | POA: Diagnosis not present

## 2024-09-24 DIAGNOSIS — T8249XA Other complication of vascular dialysis catheter, initial encounter: Secondary | ICD-10-CM

## 2024-09-24 DIAGNOSIS — B957 Other staphylococcus as the cause of diseases classified elsewhere: Secondary | ICD-10-CM | POA: Diagnosis not present

## 2024-09-24 HISTORY — PX: TUNNELLED CATHETER EXCHANGE: CATH118373

## 2024-09-24 LAB — RENAL FUNCTION PANEL
Albumin: 3.8 g/dL (ref 3.5–5.0)
Anion gap: 15 (ref 5–15)
BUN: 59 mg/dL — ABNORMAL HIGH (ref 6–20)
CO2: 25 mmol/L (ref 22–32)
Calcium: 9.3 mg/dL (ref 8.9–10.3)
Chloride: 94 mmol/L — ABNORMAL LOW (ref 98–111)
Creatinine, Ser: 10.6 mg/dL — ABNORMAL HIGH (ref 0.61–1.24)
GFR, Estimated: 6 mL/min — ABNORMAL LOW (ref >60)
Glucose, Bld: 84 mg/dL (ref 70–99)
Phosphorus: 8.3 mg/dL — ABNORMAL HIGH (ref 2.5–4.6)
Potassium: 5.6 mmol/L — ABNORMAL HIGH (ref 3.5–5.1)
Sodium: 134 mmol/L — ABNORMAL LOW (ref 135–145)

## 2024-09-24 LAB — CBC WITH DIFFERENTIAL/PLATELET
Abs Immature Granulocytes: 0.1 K/uL — ABNORMAL HIGH (ref 0.00–0.07)
Basophils Absolute: 0 K/uL (ref 0.0–0.1)
Basophils Relative: 1 %
Eosinophils Absolute: 0.2 K/uL (ref 0.0–0.5)
Eosinophils Relative: 4 %
HCT: 27.9 % — ABNORMAL LOW (ref 39.0–52.0)
Hemoglobin: 9 g/dL — ABNORMAL LOW (ref 13.0–17.0)
Immature Granulocytes: 2 %
Lymphocytes Relative: 30 %
Lymphs Abs: 1.9 K/uL (ref 0.7–4.0)
MCH: 28.1 pg (ref 26.0–34.0)
MCHC: 32.3 g/dL (ref 30.0–36.0)
MCV: 87.2 fL (ref 80.0–100.0)
Monocytes Absolute: 1 K/uL (ref 0.1–1.0)
Monocytes Relative: 17 %
Neutro Abs: 2.9 K/uL (ref 1.7–7.7)
Neutrophils Relative %: 46 %
Platelets: 210 K/uL (ref 150–400)
RBC: 3.2 MIL/uL — ABNORMAL LOW (ref 4.22–5.81)
RDW: 13.2 % (ref 11.5–15.5)
WBC: 6.1 K/uL (ref 4.0–10.5)
nRBC: 0 % (ref 0.0–0.2)

## 2024-09-24 LAB — CK: Total CK: 36 U/L — ABNORMAL LOW (ref 49–397)

## 2024-09-24 SURGERY — TUNNELLED CATHETER EXCHANGE

## 2024-09-24 MED ORDER — HEPARIN SODIUM (PORCINE) 1000 UNIT/ML DIALYSIS
1000.0000 [IU] | INTRAMUSCULAR | Status: DC | PRN
  Administered 2024-09-24: 10:00:00 3200 [IU] via INTRAVENOUS_CENTRAL
  Filled 2024-09-24: qty 1

## 2024-09-24 MED ORDER — HEPARIN SODIUM (PORCINE) 1000 UNIT/ML IJ SOLN
INTRAMUSCULAR | Status: AC
  Filled 2024-09-24: qty 4

## 2024-09-24 MED ORDER — HEPARIN (PORCINE) IN NACL 1000-0.9 UT/500ML-% IV SOLN
INTRAVENOUS | Status: DC | PRN
  Administered 2024-09-24: 14:00:00 500 mL

## 2024-09-24 MED ORDER — LIDOCAINE HCL (PF) 1 % IJ SOLN
INTRAMUSCULAR | Status: AC
  Filled 2024-09-24: qty 30

## 2024-09-24 MED ORDER — HEPARIN SODIUM (PORCINE) 1000 UNIT/ML IJ SOLN
INTRAMUSCULAR | Status: AC
  Filled 2024-09-24: qty 10

## 2024-09-24 MED ORDER — FENTANYL CITRATE (PF) 100 MCG/2ML IJ SOLN
INTRAMUSCULAR | Status: DC | PRN
  Administered 2024-09-24: 14:00:00 50 ug via INTRAVENOUS

## 2024-09-24 MED ORDER — MIDAZOLAM BOLUS VIA INFUSION
INTRAVENOUS | Status: DC | PRN
  Administered 2024-09-24: 14:00:00 1 mg via INTRAVENOUS

## 2024-09-24 MED ORDER — MIDAZOLAM HCL 2 MG/2ML IJ SOLN
INTRAMUSCULAR | Status: AC
  Filled 2024-09-24: qty 2

## 2024-09-24 MED ORDER — LIDOCAINE HCL (PF) 1 % IJ SOLN
INTRAMUSCULAR | Status: DC | PRN
  Administered 2024-09-24: 14:00:00 15 mL via INTRADERMAL

## 2024-09-24 MED ORDER — PENTAFLUOROPROP-TETRAFLUOROETH EX AERO: 1.0000 | INHALATION_SPRAY | CUTANEOUS | Status: DC | PRN

## 2024-09-24 MED ORDER — SODIUM CHLORIDE 0.9% FLUSH: 10.0000 mL | INTRAVENOUS | Status: DC | PRN

## 2024-09-24 MED ORDER — LIDOCAINE-PRILOCAINE 2.5-2.5 % EX CREA: 1.0000 | TOPICAL_CREAM | CUTANEOUS | Status: DC | PRN

## 2024-09-24 MED ORDER — ALTEPLASE 2 MG IJ SOLR: 2.0000 mg | Freq: Once | INTRAMUSCULAR | Status: DC | PRN

## 2024-09-24 MED ORDER — LIDOCAINE HCL (PF) 1 % IJ SOLN: 5.0000 mL | INTRAMUSCULAR | Status: DC | PRN

## 2024-09-24 MED ORDER — DAPTOMYCIN IV (FOR PTA / DISCHARGE USE ONLY): 500.0000 mg | INTRAVENOUS | Status: AC

## 2024-09-24 MED ORDER — SODIUM CHLORIDE 0.9% FLUSH
10.0000 mL | Freq: Two times a day (BID) | INTRAVENOUS | Status: DC
  Administered 2024-09-24 – 2024-09-25 (×2): 10 mL

## 2024-09-24 MED FILL — Fentanyl Citrate Preservative Free (PF) Inj 100 MCG/2ML: INTRAMUSCULAR | Qty: 2 | Status: AC

## 2024-09-24 SURGICAL SUPPLY — 3 items
CATH PALINDROME-P 19CM W/VT (CATHETERS) IMPLANT
GLIDEWIRE ADV .035X260CM (WIRE) IMPLANT
PACK CARDIAC CATHETERIZATION (CUSTOM PROCEDURE TRAY) IMPLANT

## 2024-09-24 NOTE — Progress Notes (Signed)
 PROGRESS NOTE    Jorge [name]  FMW:[id number] DOB: 01/31/1981 DOA: 09/11/2024 PCP: Austin Mutton, MD    Brief Narrative:   43 year old man with ESRD on HD, HTN, SLE on hydroxychloroquine , GERD was admitted infected nonfunctional right arm AV graft.  Patient underwent excision of the RUE AV graft with drainage and vein angioplasty.  Now with MRSA bacteremia with concern for endocarditis.  Patient was treated with cefepime , ID consulting recommends daptomycin  to complete 6 weeks course however there has been difficulty obtaining daptomycin  at his hemodialysis center.  Patient history of vancomycin allergy.  TEE scheduled for 12/16 in case he cannot get 6 full weeks of daptomycin  at HD.  Following surveillance blood cultures.  Eventually echocardiogram was unremarkable.  Currently doing well.  Once cleared by ID, discharge with antibiotics.  EOT would be October 10, 2024.  Vascular to change TDC.  Assessment & Plan:    Infected nonfunctional RUE AV graft s/p excision of graft MRSA bacteremia with concern for endocarditis Immunocompromised on Plaquenil  RUE AV graft was excised 12/5, s/p vein patch angioplasty, R brachial artery to brachial vein Graft growing out MRSA, blood cultures positive for MRSA 12/4 Surveillance blood cultures have been negative to date TEE is negative, 4 weeks of antibiotics should suffice.  Daptomycin  EOT 10/10/2024.  Per ID, exchange TDC.  Vascular following   ESRD on HD Hyperkalemia Followed by nephrology Give Lokelma  rest of it can be managed with dialysis   HTN On Norvasc , labetalol  IV as needed   SLE No evidence for acute flare Continue Plaquenil        DVT prophylaxis: Subcu heparin  Code Status: Full Family Communication: None today Status is: Inpatient Remains inpatient appropriate because:   PT Follow up Recs:   Subjective: Seen in HD Doing ok. No complaints.  Plans for [facility] exchange later.    Examination: General exam: Appears calm and  comfortable  Respiratory system: Clear to auscultation. Respiratory effort normal. Cardiovascular system: S1 & S2 heard, RRR. No JVD, murmurs, rubs, gallops or clicks. No pedal edema. Gastrointestinal system: Abdomen is nondistended, soft and nontender. No organomegaly or masses felt. Normal bowel sounds heard. Central nervous system: Alert and oriented. No focal neurological deficits. Extremities: Symmetric 5 x 5 power. Skin: No rashes, lesions or ulcers Psychiatry: Judgement and insight appear normal. Mood & affect appropriate.                Diet Orders (From admission, onward)     Start     Ordered   09/24/24 0001  Diet NPO time specified  Diet effective midnight        09/23/24 1415            Objective: Vitals:   09/24/24 0820 09/24/24 0827 09/24/24 0857 09/24/24 0905  BP: (!) 141/91 (!) 138/91 130/81 130/81  Pulse: 76 77 76 74  Resp: 10 16 19 13   Temp: 97.9 F (36.6 C)     TempSrc:      SpO2:  98% 99% 100%  Weight: 67.4 kg     Height:        Intake/Output Summary (Last 24 hours) at 09/24/2024 0951 Last data filed at 09/24/2024 0600 Gross per 24 hour  Intake 107.73 ml  Output --  Net 107.73 ml   Filed Weights   09/19/24 1219 09/22/24 0821 09/24/24 0820  Weight: 65.4 kg 66.8 kg 67.4 kg    Scheduled Meds:  amLODipine   10 mg Oral q morning   Chlorhexidine  Gluconate Cloth  6  each Topical Q0600   Chlorhexidine  Gluconate Cloth  6 each Topical Q0600   darbepoetin (ARANESP ) injection - DIALYSIS  40 mcg Subcutaneous Q Fri-1800   heparin   5,000 Units Subcutaneous Q8H   hydroxychloroquine   200 mg Oral BH-q7a   labetalol   100 mg Oral BID   montelukast   10 mg Oral Daily   sevelamer  carbonate  1,600 mg Oral TID WC   Continuous Infusions:  DAPTOmycin  500 mg (09/19/24 1813)    Nutritional status     Body mass index is 22.59 kg/m.  Data Reviewed:   CBC: Recent Labs  Lab 09/19/24 0437 09/19/24 0807 09/22/24 0358 09/23/24 0444 09/24/24 0400   WBC 5.2 5.1 7.5 6.4 6.1  NEUTROABS  --   --   --   --  2.9  HGB 9.4* 8.9* 9.0* 9.8* 9.0*  HCT 29.4* 28.5* 28.1* 30.5* 27.9*  MCV 87.5 88.2 86.7 87.9 87.2  PLT 217 222 198 206 210   Basic Metabolic Panel: Recent Labs  Lab 09/19/24 0807 09/22/24 0358 09/23/24 0444 09/24/24 0400  NA 133* 138 134* 134*  K 4.8 5.8* 5.5* 5.6*  CL 94* 100 94* 94*  CO2 28 24 31 25   GLUCOSE 119* 86 82 84  BUN 50* 67* 40* 59*  CREATININE 10.58* 12.68* 8.39* 10.60*  CALCIUM 8.7* 9.0 9.1 9.3  MG  --   --  2.1  --   PHOS 8.5*  --   --  8.3*   GFR: Estimated Creatinine Clearance: 8.7 mL/min (A) (by C-G formula based on SCr of 10.6 mg/dL (H)). Liver Function Tests: Recent Labs  Lab 09/19/24 0807 09/24/24 0400  ALBUMIN  3.0* 3.8   No results for input(s): LIPASE, AMYLASE in the last 168 hours. No results for input(s): AMMONIA in the last 168 hours. Coagulation Profile: No results for input(s): INR, PROTIME in the last 168 hours. Cardiac Enzymes: Recent Labs  Lab 09/24/24 0400  CKTOTAL 36*   BNP (last 3 results) No results for input(s): PROBNP in the last 8760 hours. HbA1C: No results for input(s): HGBA1C in the last 72 hours. CBG: No results for input(s): GLUCAP in the last 168 hours. Lipid Profile: No results for input(s): CHOL, HDL, LDLCALC, TRIG, CHOLHDL, LDLDIRECT in the last 72 hours. Thyroid Function Tests: No results for input(s): TSH, T4TOTAL, FREET4, T3FREE, THYROIDAB in the last 72 hours. Anemia Panel: No results for input(s): VITAMINB12, FOLATE, FERRITIN, TIBC, IRON, RETICCTPCT in the last 72 hours. Sepsis Labs: No results for input(s): PROCALCITON, LATICACIDVEN in the last 168 hours.  Recent Results (from the past 240 hours)  Culture, blood (Routine X 2) w Reflex to ID Panel     Status: None   Collection Time: 09/16/24  3:00 PM   Specimen: BLOOD  Result Value Ref Range Status   Specimen Description BLOOD SITE NOT  SPECIFIED  Final   Special Requests   Final    BOTTLES DRAWN AEROBIC AND ANAEROBIC Blood Culture adequate volume   Culture   Final    NO GROWTH 5 DAYS Performed at [facility], [address], [place], [place] [address]    Report Status 09/21/2024 FINAL  Final  Culture, blood (Routine X 2) w Reflex to ID Panel     Status: None   Collection Time: 09/16/24  3:12 PM   Specimen: BLOOD LEFT HAND  Result Value Ref Range Status   Specimen Description BLOOD LEFT HAND  Final   Special Requests   Final    BOTTLES DRAWN  AEROBIC AND ANAEROBIC Blood Culture adequate volume   Culture   Final    NO GROWTH 5 DAYS Performed at [facility], [address], [place], [place] [address]    Report Status 09/21/2024 FINAL  Final  MIC (1 Drug)-     Status: None   Collection Time: 09/19/24  8:47 AM  Result Value Ref Range Status   Min Inhibitory Conc (1 Drug) Preliminary report  Final    Comment: (NOTE) Performed At: [facility] [address] [place], [place] [id number] Jennette Shorter MD Ey:[id number]    Source lab [phone]  Final    Comment: Performed at [facility], [address], [place], [place] [address]  MIC Result     Status: None   Collection Time: 09/19/24  8:47 AM  Result Value Ref Range Status   Result 1 (MIC) Comment  Final    Comment: (NOTE) Staphylococcus epidermidis Identification performed by account, not confirmed by this laboratory. Daptomycin  Performed At: [facility] [address] [place], [place] [id number] Jennette Shorter MD Ey:[id number]          Radiology Studies: ECHO TEE Result Date: 09/23/2024    TRANSESOPHOGEAL ECHO REPORT   Patient Name:   QUINNTON BURY Date of Exam: 09/23/2024 Medical Rec #:  [id number]       Height:       68.0 in Accession #:    [id number]      Weight:       147.3 lb Date of Birth:  January 18, 1981       BSA:          1.794 m Patient Age:    43 years        BP:           136/97 mmHg Patient Gender: M                HR:           84 bpm. Exam Location:  Inpatient Procedure: Transesophageal Echo, Color Doppler and Cardiac Doppler (Both            Spectral and Color Flow Doppler were utilized during procedure). Indications:     Endocarditis  History:         Patient has prior history of Echocardiogram examinations.                  Endocarditis; Signs/Symptoms:Bacteremia.  Sonographer:     Koleen Popper RDCS Referring Phys:  [id number] MAUDE JAYSON EMMER Diagnosing Phys: [name] MD PROCEDURE: After discussion of the risks and benefits of a TEE, an informed consent was obtained from the patient. The transesophogeal probe was passed without difficulty through the esophogus of the patient. Imaged were obtained with the patient in a left lateral decubitus position. Sedation performed by different physician. The patient was monitored while under deep sedation. Anesthestetic sedation was provided intravenously by Anesthesiology: 135mg  of Propofol , 80mg  of Lidocaine . Image quality was technically difficult. The patient's vital signs; including heart rate, blood pressure, and oxygen saturation; remained stable throughout the procedure. The patient developed no complications during the procedure. Transgastric views not obtained: patient did not tolerate.  IMPRESSIONS  1. Philips mini probe used due to history of strictures. No problems passing probe Study terminated early due to apnea and bronchospasm Sats never dropped Bronchospasm and apnea resolved after probe removed.  2. Left ventricular ejection fraction, by estimation, is 60 to 65%. The left ventricle has normal function. The left ventricle  has no regional wall motion abnormalities.  3. Right ventricular systolic function is normal. The right ventricular size is normal.  4. No left atrial/left atrial appendage thrombus was detected.  5. The mitral valve is normal in structure. No evidence of mitral valve regurgitation. No evidence of mitral stenosis.  6. The aortic valve is normal  in structure. Aortic valve regurgitation is not visualized. No aortic stenosis is present.  7. The inferior vena cava is normal in size with greater than 50% respiratory variability, suggesting right atrial pressure of 3 mmHg.  8. None and demonstrates None performed. Conclusion(s)/Recommendation(s): Normal biventricular function without evidence of hemodynamically significant valvular heart disease. FINDINGS  Left Ventricle: Left ventricular ejection fraction, by estimation, is 60 to 65%. The left ventricle has normal function. The left ventricle has no regional wall motion abnormalities. The left ventricular internal cavity size was normal in size. There is  no left ventricular hypertrophy. Right Ventricle: The right ventricular size is normal. No increase in right ventricular wall thickness. Right ventricular systolic function is normal. Left Atrium: Left atrial size was normal in size. No left atrial/left atrial appendage thrombus was detected. Right Atrium: Right atrial size was normal in size. Pericardium: There is no evidence of pericardial effusion. Mitral Valve: The mitral valve is normal in structure. No evidence of mitral valve regurgitation. No evidence of mitral valve stenosis. Tricuspid Valve: The tricuspid valve is normal in structure. Tricuspid valve regurgitation is not demonstrated. No evidence of tricuspid stenosis. Aortic Valve: The aortic valve is normal in structure. Aortic valve regurgitation is not visualized. No aortic stenosis is present. Pulmonic Valve: The pulmonic valve was normal in structure. Pulmonic valve regurgitation is not visualized. No evidence of pulmonic stenosis. Aorta: The aortic root is normal in size and structure. Venous: The inferior vena cava is normal in size with greater than 50% respiratory variability, suggesting right atrial pressure of 3 mmHg. IAS/Shunts: No atrial level shunt detected by color flow Doppler. Additional Comments: Philips mini probe used due to  history of strictures. No problems passing probe Study terminated early due to apnea and bronchospasm Sats never dropped Bronchospasm and apnea resolved after probe removed. 3D was performed not requiring image post processing on an independent workstation and was indeterminate. LEFT VENTRICLE PLAX 2D LVOT diam:     1.90 cm LVOT Area:     2.84 cm   AORTA Ao Root diam: 2.90 cm  SHUNTS Systemic Diam: 1.90 cm [name] MD Electronically signed by [name] MD Signature Date/Time: 09/23/2024/9:02:23 AM    Final    EP STUDY Result Date: 09/23/2024 See surgical note for result.          LOS: 12 days   Time spent= 35 mins    Burgess JAYSON Dare, MD [facility]  If 7PM-7AM, please contact night-coverage  09/24/2024, 9:51 AM

## 2024-09-24 NOTE — TOC Progression Note (Signed)
 Transition of Care [facility]) - Progression Note    Patient Details  Name: Jorge [name] MRN: [id number] Date of Birth: [birth year]  [facility] CM/SW Contact  Tom-Johnson, Harvest Muskrat, RN Phone Number: 09/24/2024, 9:31 AM  Clinical Narrative:     Patient underwent TEE on 09/23/24 which showed no Vegetation. Vascular following, planning TDC exchange today 09/24/24 in the Cath Lab. Patient continues on Daptomycin  for 4 weeks, ID following. Patient continues iHD, Nephrology following.   CM will continue to follow as patient progresses with care towards discharge.                     Expected Discharge Plan and Services                                               Social Drivers of Health (SDOH) Interventions SDOH Screenings   Food Insecurity: No Food Insecurity (09/13/2024)  Housing: Low Risk (09/13/2024)  Transportation Needs: No Transportation Needs (09/13/2024)  Recent Concern: Transportation Needs - Unmet Transportation Needs (07/10/2024)   Received from [facility]  Utilities: Not At Risk (09/13/2024)  Social Connections: Unknown (02/05/2022)   Received from [facility]  Tobacco Use: Low Risk (09/12/2024)    Readmission Risk Interventions    09/12/2024   12:56 PM  Readmission Risk Prevention Plan  Transportation Screening Complete  PCP or Specialist Appt within 5-7 Days Complete  Home Care Screening Complete  Medication Review (RN CM) Complete

## 2024-09-24 NOTE — Progress Notes (Signed)
 [facility] Progress Note   Subjective:    Seen and examined patient on HD. Tolerating UFG 1.5L. Feels fine. Plan for [facility] exchange this afternoon.  Objective Vitals:   09/24/24 0827 09/24/24 0905 09/24/24 0935 09/24/24 0955  BP: (!) 138/91 130/81 (!) 149/90 (!) 142/57  Pulse: 77 74 72 79  Resp: 16 13 14 15   Temp:      TempSrc:      SpO2: 98% 100% 100% 98%  Weight:      Height:       Physical Exam General: Alert, well appearing male in NAD Heart: RRR, no murmurs Lungs: CTA bilaterally, respirations unlabored on RA Abdomen: Soft, non-distended, +BS Extremities: No edema b/l lower extremities Dialysis Access:  [facility]   Filed Weights   09/19/24 1219 09/22/24 0821 09/24/24 0820  Weight: 65.4 kg 66.8 kg 67.4 kg    Intake/Output Summary (Last 24 hours) at 09/24/2024 1011 Last data filed at 09/24/2024 0600 Gross per 24 hour  Intake 107.73 ml  Output --  Net 107.73 ml    Additional Objective Labs: Basic Metabolic Panel: Recent Labs  Lab 09/19/24 0807 09/22/24 0358 09/23/24 0444 09/24/24 0400  NA 133* 138 134* 134*  K 4.8 5.8* 5.5* 5.6*  CL 94* 100 94* 94*  CO2 28 24 31 25   GLUCOSE 119* 86 82 84  BUN 50* 67* 40* 59*  CREATININE 10.58* 12.68* 8.39* 10.60*  CALCIUM 8.7* 9.0 9.1 9.3  PHOS 8.5*  --   --  8.3*   Liver Function Tests: Recent Labs  Lab 09/19/24 0807 09/24/24 0400  ALBUMIN  3.0* 3.8   No results for input(s): LIPASE, AMYLASE in the last 168 hours. CBC: Recent Labs  Lab 09/19/24 0437 09/19/24 0807 09/22/24 0358 09/23/24 0444 09/24/24 0400  WBC 5.2 5.1 7.5 6.4 6.1  NEUTROABS  --   --   --   --  2.9  HGB 9.4* 8.9* 9.0* 9.8* 9.0*  HCT 29.4* 28.5* 28.1* 30.5* 27.9*  MCV 87.5 88.2 86.7 87.9 87.2  PLT 217 222 198 206 210   Blood Culture    Component Value Date/Time   SDES BLOOD LEFT HAND 09/16/2024 1512   SPECREQUEST  09/16/2024 1512    BOTTLES DRAWN AEROBIC AND ANAEROBIC Blood Culture adequate volume   CULT  09/16/2024  1512    NO GROWTH 5 DAYS Performed at [facility], [address] [place], [place] [address]    REPTSTATUS 09/21/2024 FINAL 09/16/2024 1512    Cardiac Enzymes: Recent Labs  Lab 09/24/24 0400  CKTOTAL 36*   CBG: No results for input(s): GLUCAP in the last 168 hours. Iron Studies: No results for input(s): IRON, TIBC, TRANSFERRIN, FERRITIN in the last 72 hours. Lab Results  Component Value Date   INR 1.1 01/14/2024   Studies/Results: ECHO TEE Result Date: 09/23/2024    TRANSESOPHOGEAL ECHO REPORT   Patient Name:   Jorge [name] Date of Exam: 09/23/2024 Medical Rec #:  [id number]       Height:       68.0 in Accession #:    [id number]      Weight:       147.3 lb Date of Birth:  [birth year]       BSA:          1.794 m Patient Age:    43 years        BP:           136/97 mmHg Patient Gender: M  HR:           84 bpm. Exam Location:  Inpatient Procedure: Transesophageal Echo, Color Doppler and Cardiac Doppler (Both            Spectral and Color Flow Doppler were utilized during procedure). Indications:     Endocarditis  History:         Patient has prior history of Echocardiogram examinations.                  Endocarditis; Signs/Symptoms:Bacteremia.  Sonographer:     Koleen Popper RDCS Referring Phys:  [id number] MAUDE JAYSON EMMER Diagnosing Phys: [name] MD PROCEDURE: After discussion of the risks and benefits of a TEE, an informed consent was obtained from the patient. The transesophogeal probe was passed without difficulty through the esophogus of the patient. Imaged were obtained with the patient in a left lateral decubitus position. Sedation performed by different physician. The patient was monitored while under deep sedation. Anesthestetic sedation was provided intravenously by Anesthesiology: 135mg  of Propofol , 80mg  of Lidocaine . Image quality was technically difficult. The patient's vital signs; including heart rate, blood pressure, and oxygen saturation; remained  stable throughout the procedure. The patient developed no complications during the procedure. Transgastric views not obtained: patient did not tolerate.  IMPRESSIONS  1. Philips mini probe used due to history of strictures. No problems passing probe Study terminated early due to apnea and bronchospasm Sats never dropped Bronchospasm and apnea resolved after probe removed.  2. Left ventricular ejection fraction, by estimation, is 60 to 65%. The left ventricle has normal function. The left ventricle has no regional wall motion abnormalities.  3. Right ventricular systolic function is normal. The right ventricular size is normal.  4. No left atrial/left atrial appendage thrombus was detected.  5. The mitral valve is normal in structure. No evidence of mitral valve regurgitation. No evidence of mitral stenosis.  6. The aortic valve is normal in structure. Aortic valve regurgitation is not visualized. No aortic stenosis is present.  7. The inferior vena cava is normal in size with greater than 50% respiratory variability, suggesting right atrial pressure of 3 mmHg.  8. None and demonstrates None performed. Conclusion(s)/Recommendation(s): Normal biventricular function without evidence of hemodynamically significant valvular heart disease. FINDINGS  Left Ventricle: Left ventricular ejection fraction, by estimation, is 60 to 65%. The left ventricle has normal function. The left ventricle has no regional wall motion abnormalities. The left ventricular internal cavity size was normal in size. There is  no left ventricular hypertrophy. Right Ventricle: The right ventricular size is normal. No increase in right ventricular wall thickness. Right ventricular systolic function is normal. Left Atrium: Left atrial size was normal in size. No left atrial/left atrial appendage thrombus was detected. Right Atrium: Right atrial size was normal in size. Pericardium: There is no evidence of pericardial effusion. Mitral Valve: The mitral  valve is normal in structure. No evidence of mitral valve regurgitation. No evidence of mitral valve stenosis. Tricuspid Valve: The tricuspid valve is normal in structure. Tricuspid valve regurgitation is not demonstrated. No evidence of tricuspid stenosis. Aortic Valve: The aortic valve is normal in structure. Aortic valve regurgitation is not visualized. No aortic stenosis is present. Pulmonic Valve: The pulmonic valve was normal in structure. Pulmonic valve regurgitation is not visualized. No evidence of pulmonic stenosis. Aorta: The aortic root is normal in size and structure. Venous: The inferior vena cava is normal in size with greater than 50% respiratory variability, suggesting right atrial pressure of 3  mmHg. IAS/Shunts: No atrial level shunt detected by color flow Doppler. Additional Comments: Philips mini probe used due to history of strictures. No problems passing probe Study terminated early due to apnea and bronchospasm Sats never dropped Bronchospasm and apnea resolved after probe removed. 3D was performed not requiring image post processing on an independent workstation and was indeterminate. LEFT VENTRICLE PLAX 2D LVOT diam:     1.90 cm LVOT Area:     2.84 cm   AORTA Ao Root diam: 2.90 cm  SHUNTS Systemic Diam: 1.90 cm [name] MD Electronically signed by [name] MD Signature Date/Time: 09/23/2024/9:02:23 AM    Final    EP STUDY Result Date: 09/23/2024 See surgical note for result.   Medications:  DAPTOmycin  500 mg (09/19/24 1813)    amLODipine   10 mg Oral q morning   Chlorhexidine  Gluconate Cloth  6 each Topical Q0600   Chlorhexidine  Gluconate Cloth  6 each Topical Q0600   darbepoetin (ARANESP ) injection - DIALYSIS  40 mcg Subcutaneous Q Fri-1800   heparin   5,000 Units Subcutaneous Q8H   hydroxychloroquine   200 mg Oral BH-q7a   labetalol   100 mg Oral BID   montelukast   10 mg Oral Daily   sevelamer  carbonate  1,600 mg Oral TID WC    Dialysis Orders: [facility]  [facility]   Assessment/Plan: 1. AVG infection/ MRSE bacteremia - excision of RU AVG and seroma with vein patch angioplasty by Dr. Gretta. Blood cx's from 12/12 (+) staphylococcus epidermidis. Followed by ID. TEE was negative and his outpatient HD clinic has 10 Daptomycon doses ready. Since TEE is negative, this will be enough supply to complete 4-week course end date 10/13/24. Per ID, patient will NOT need a line holiday but a HD catheter exchange instead. Re-consulted [facility]VS. Plan for catheter exchange this afternoon. 2. ESRD -on HD MWF.  Tolerating HD without issues. On HD. 3. Hyperkalemia - K+ levels ranging upper 5s despite Lokelma  and HD. Ensure he's not eating any outside food. Will consider Lokelma  daily. 4. Anemia of CKD- Hgb 9.0. On Aranesp  40mcg. Titrate up dose if needed.  5. HTN/volume - Blood pressure in goal. Continue home meds. 6. Nutrition -  Renal diet w/fluid restrictions. Reviewed low K diet.   7. SLE - on plaquenil  8. Dispo - patient informs me he may be going home soon. Okay for discharge from a renal standpoint after [facility] is exchanged.  Charmaine Piety, NP [facility] 09/24/2024,10:11 AM  LOS: 12 days

## 2024-09-24 NOTE — Op Note (Signed)
° ° °  Patient name: Jorge [name] MRN: [id number] DOB: Feb 17, 1981 Sex: male  09/24/2024 Pre-operative Diagnosis: End-stage renal disease requiring dialysis Post-operative diagnosis:  Same Surgeon:  Fonda FORBES Rim, MD Procedure Performed: 1.  Right internal jugular tunneled dialysis catheter exchange - 19 cm palindrome   Indications: Patient is a 43 year old male with history of end-stage renal disease with tunneled dialysis catheter which is not functioning appropriately.  I have discussed the risks and benefits, Mare elected to proceed.  Findings:  Tunneled dialysis catheter exchange with the tip placed at the cavoatrial junction.   Procedure:   Patient was brought to the Cath Lab laid in supine position.  Moderate anesthesia was induced and the patient was prepped draped standard fashion.  The case began with wiring the previous 19 cm palindrome catheter.  The felt bumper was then removed from surrounding soft tissue using sharp dissection.  Next, the catheter was removed over the wire and a new set 19 cm tunneled palindrome catheter brought into the field and replaced in the same tunnel track.  The tip of the catheter was placed at the cavoatrial junction.  It flushed nicely.  The catheter was sewn in place.    Can be used for dialysis.   Fonda FORBES Rim MD [facility] Office: [phone]

## 2024-09-24 NOTE — Plan of Care (Signed)
  Problem: Clinical Measurements: Goal: Cardiovascular complication will be avoided Outcome: Progressing   Problem: Nutrition: Goal: Adequate nutrition will be maintained Outcome: Progressing   Problem: Pain Managment: Goal: General experience of comfort will improve and/or be controlled Outcome: Progressing   Problem: Safety: Goal: Ability to remain free from injury will improve Outcome: Progressing

## 2024-09-24 NOTE — Progress Notes (Addendum)
 Pt. Came in awake and oriented and with no complaints. Consent verified and on file. Tx started with no complications. Seen by Dr. Lamar Fret  UF removed: Tx Duration: 2.49 hours  Access used: Right CVC Access Issue: None  Early termination due to pt. Needed to go to cath lab for surgery. Pt. Awake and oriented. Catheter dwelled and locked with heparin  Endorsed to floor nurse and the cathlab nurse Transported to cath lab  Estanislao Auther Shope, RN [facility]

## 2024-09-24 NOTE — Progress Notes (Signed)
°   09/24/24 1223  Vitals  Temp 98.4 F (36.9 C)  Pulse Rate 82  Resp 17  BP 133/83  SpO2 100 %  O2 Device Room Air  Weight 67.3 kg  Post Treatment  Dialyzer Clearance Lightly streaked  Liters Processed 61.1  Fluid Removed (mL) 800 mL  Tolerated HD Treatment Yes  Post-Hemodialysis Comments Pt. needed to end tx early due to pt. schedule for surgery

## 2024-09-24 NOTE — Progress Notes (Signed)
 Moderate amount of fresh blood noted oozing from new dialysis port to right chest, pt. C/o discomfort at site, site slightly swollen, Dr.Robins aware, no new orders obtained

## 2024-09-25 DIAGNOSIS — B957 Other staphylococcus as the cause of diseases classified elsewhere: Secondary | ICD-10-CM | POA: Diagnosis not present

## 2024-09-25 DIAGNOSIS — R7881 Bacteremia: Secondary | ICD-10-CM | POA: Diagnosis not present

## 2024-09-25 LAB — MIC RESULT

## 2024-09-25 LAB — MINIMUM INHIBITORY CONC. (1 DRUG)

## 2024-09-25 NOTE — Progress Notes (Addendum)
 Discharge orders noted. Called [facility] who stated they do have the iv abx. Notified clinic of pt d/c and that pt will arrive back to clinic tomorrow. Will fax d/c summary and last neph note once available.   Lavanda Fredrickson Dialysis Navigator [phone]  Addendum 12/18 1106am D/c summary faxed and last neph note faxed. No further support needed.

## 2024-09-25 NOTE — TOC Transition Note (Signed)
 Transition of Care [facility]) - Discharge Note   Patient Details  Name: Jorge [name] MRN: [id number] Date of Birth: 07-Mar-1981  [facility] CM/SW Contact:  Tom-Johnson, Harvest Muskrat, RN Phone Number: 09/25/2024, 9:19 AM   Clinical Narrative:     Patient is scheduled for discharge today.  Readmission Risk Assessment done. Outpatient f/u, hospital f/u and discharge instructions on AVS. Patient will receive IV Abt Daptomycin  at his Outpatient HD Clinic. No ICM needs or recommendations noted. Significant other to transport at discharge.  No further ICM needs noted.      Final next level of care: Home/Self Care Barriers to Discharge: Barriers Resolved   Patient Goals and CMS Choice Patient states their goals for this hospitalization and ongoing recovery are:: To return home CMS Medicare.gov Compare Post Acute Care list provided to:: Patient Choice offered to / list presented to : NA      Discharge Placement                Patient to be transferred to facility by: Significant other      Discharge Plan and Services Additional resources added to the After Visit Summary for                  DME Arranged: N/A DME Agency: NA       HH Arranged: NA HH Agency: NA        Social Drivers of Health (SDOH) Interventions SDOH Screenings   Food Insecurity: No Food Insecurity (09/13/2024)  Housing: Low Risk (09/13/2024)  Transportation Needs: No Transportation Needs (09/13/2024)  Recent Concern: Transportation Needs - Unmet Transportation Needs (07/10/2024)   Received [facility]  Utilities: Not At Risk (09/13/2024)  Social Connections: Unknown (02/05/2022)   Received from [facility]  Tobacco Use: Low Risk (09/12/2024)     Readmission Risk Interventions    09/12/2024   12:56 PM  Readmission Risk Prevention Plan  Transportation Screening Complete  PCP or Specialist Appt within 5-7 Days Complete  Home Care Screening Complete   Medication Review (RN CM) Complete

## 2024-09-25 NOTE — Progress Notes (Addendum)
 [name] Progress Note   Subjective:    Seen and examined patient at bedside. He reports feeling well. Tolerated yesterday's HD with removed. Discussed with the Hospitalist. Plan for discharge today.  Objective Vitals:   09/24/24 2341 09/25/24 0508 09/25/24 0839 09/25/24 0913  BP: (!) 110/90 103/62 (!) 88/63 116/69  Pulse: 85 78 79 73  Resp:  18 17   Temp:  98.2 F (36.8 C) 98.2 F (36.8 C)   TempSrc:  Oral Oral   SpO2:  92% 100%   Weight:      Height:       Physical Exam General: Alert, well appearing male in NAD Heart: RRR, no murmurs Lungs: CTA bilaterally, respirations unlabored on RA Abdomen: Soft and non-distended Extremities: No edema b/l lower extremities Dialysis Access:  [facility]   Filed Weights   09/22/24 0821 09/24/24 0820 09/24/24 1223  Weight: 66.8 kg 67.4 kg 67.3 kg    Intake/Output Summary (Last 24 hours) at 09/25/2024 1027 Last data filed at 09/24/2024 1223 Gross per 24 hour  Intake --  Output 800 ml  Net -800 ml    Additional Objective Labs: Basic Metabolic Panel: Recent Labs  Lab 09/19/24 0807 09/22/24 0358 09/23/24 0444 09/24/24 0400  NA 133* 138 134* 134*  K 4.8 5.8* 5.5* 5.6*  CL 94* 100 94* 94*  CO2 28 24 31 25   GLUCOSE 119* 86 82 84  BUN 50* 67* 40* 59*  CREATININE 10.58* 12.68* 8.39* 10.60*  CALCIUM 8.7* 9.0 9.1 9.3  PHOS 8.5*  --   --  8.3*   Liver Function Tests: Recent Labs  Lab 09/19/24 0807 09/24/24 0400  ALBUMIN  3.0* 3.8   No results for input(s): LIPASE, AMYLASE in the last 168 hours. CBC: Recent Labs  Lab 09/19/24 0437 09/19/24 0807 09/22/24 0358 09/23/24 0444 09/24/24 0400  WBC 5.2 5.1 7.5 6.4 6.1  NEUTROABS  --   --   --   --  2.9  HGB 9.4* 8.9* 9.0* 9.8* 9.0*  HCT 29.4* 28.5* 28.1* 30.5* 27.9*  MCV 87.5 88.2 86.7 87.9 87.2  PLT 217 222 198 206 210   Blood Culture    Component Value Date/Time   SDES BLOOD LEFT HAND 09/16/2024 1512   SPECREQUEST  09/16/2024 1512     BOTTLES DRAWN AEROBIC AND ANAEROBIC Blood Culture adequate volume   CULT  09/16/2024 1512    NO GROWTH 5 DAYS Performed at [facility], [address] [place], [place] [address]    REPTSTATUS 09/21/2024 FINAL 09/16/2024 1512    Cardiac Enzymes: Recent Labs  Lab 09/24/24 0400  CKTOTAL 36*   CBG: No results for input(s): GLUCAP in the last 168 hours. Iron Studies: No results for input(s): IRON, TIBC, TRANSFERRIN, FERRITIN in the last 72 hours. Lab Results  Component Value Date   INR 1.1 01/14/2024   Studies/Results: PERIPHERAL VASCULAR CATHETERIZATION Result Date: 09/24/2024 Images from the original result were not included. Patient name: Jorge [name] MRN: [id number] DOB: Dec 10, 1980 Sex: male 09/24/2024 Pre-operative Diagnosis: End-stage renal disease requiring dialysis Post-operative diagnosis:  Same Surgeon:  Fonda FORBES Rim, MD Procedure Performed: 1.  Right internal jugular tunneled dialysis catheter exchange - 19 cm palindrome Indications: Patient is a 43 year old male with history of end-stage renal disease with tunneled dialysis catheter which is not functioning appropriately.  I have discussed the risks and benefits, Jorge [name] elected to proceed. Findings: Tunneled dialysis catheter exchange with the tip placed at the cavoatrial junction.  Procedure:  Patient  was brought to the Cath Lab laid in supine position.  Moderate anesthesia was induced and the patient was prepped draped standard fashion.  The case began with wiring the previous 19 cm palindrome catheter which was placed in the right internal jugular vein and laid on the right chest.  The felt bumper was then removed from surrounding soft tissue using sharp dissection.  Next, the catheter was removed over the wire and a new set 19 cm tunneled palindrome catheter brought into the field and replaced in the same tunnel track.  The tip of the catheter was placed at the cavoatrial junction.  It flushed nicely.  The  catheter was sewn in place.  Can be used for dialysis. Fonda FORBES Rim MD [facility] Office: [phone]    Medications:  DAPTOmycin  500 mg (09/24/24 1814)    amLODipine   10 mg Oral q morning   Chlorhexidine  Gluconate Cloth  6 each Topical Q0600   darbepoetin (ARANESP ) injection - DIALYSIS  40 mcg Subcutaneous Q Fri-1800   heparin   5,000 Units Subcutaneous Q8H   hydroxychloroquine   200 mg Oral BH-q7a   labetalol   100 mg Oral BID   montelukast   10 mg Oral Daily   sevelamer  carbonate  1,600 mg Oral TID WC   sodium chloride  flush  10-40 mL Intracatheter Q12H    Dialysis Orders: [facility] MWF [address]  [facility]   Assessment/Plan: 1. AVG infection/ MRSE bacteremia - excision of RU AVG and seroma with vein patch angioplasty by Dr. Gretta. Blood cx's from 12/12 (+) staphylococcus epidermidis. Followed by ID. TEE was negative and his outpatient HD clinic has 10 Daptomycon doses ready. Since TEE is negative, this will be enough supply to complete 4-week course. Patient to continue Daptomycon 500mg  with HD end date 10/14/24. Per ID, patient will NOT need a line holiday but a HD catheter exchange instead. S/p TDC exchange 12/17 by [facility]. 2. ESRD -on HD MWF.  Tolerating HD without issues. Next HD 12/19 in outpatient. 3. Hyperkalemia - K+ levels ranging upper 5s despite Lokelma  and HD. He informs me he has been eating outside foods here. Discussed low-potassium diet. Consider Lokelma  daily and weekly K+ checks in outpatient if levels do not improve. 4. Anemia of CKD- Hgb 9.0. On Aranesp  40mcg. Titrate up dose if needed.  5. HTN/volume - Blood pressure in goal. Continue home meds. 6. Nutrition -  Renal diet w/fluid restrictions. Reviewed low K diet.   7. SLE - on plaquenil  8. Dispo - Okay for discharge today from a renal standpoint.  *I spoke with the HD RN at patient's outpatient HD center to inform him of elevated K+ and intermittent dosing of Lokelma . I recommended  considering weekly K+ checks and daily Lokelma  if K+ levels do not improve*  Charmaine Piety, NP [facility] 09/25/2024,10:27 AM  LOS: 13 days

## 2024-09-25 NOTE — Discharge Summary (Signed)
 Physician Discharge Summary  Jorge [name] FMW:[id number] DOB: 10-08-1981 DOA: 09/11/2024  PCP: Austin Mutton, MD  Admit date: 09/11/2024 Discharge date: 09/25/2024  Admitted From: Home Disposition:  Home  Recommendations for Outpatient Follow-up:  Follow up with PCP in 1-2 weeks Please obtain BMP/CBC in one week your next doctors visit.  Outpatient follow-up with nephrology and infectious disease Continue daptomycin  with hemodialysis until October 10, 2024 [facility] catheter exchanged by vascular on 12/17   Discharge Condition: Stable CODE STATUS: Full code Diet recommendation: Renal  Brief/Interim Summary: Brief Narrative:   43 year old man with ESRD on HD, HTN, SLE on hydroxychloroquine , GERD was admitted infected nonfunctional right arm AV graft.  Patient underwent excision of the RUE AV graft with drainage and vein angioplasty.  Now with MRSA bacteremia with concern for endocarditis.  Patient was treated with cefepime , ID consulting recommends daptomycin  to complete 6 weeks course however there has been difficulty obtaining daptomycin  at his hemodialysis center.  Patient history of vancomycin allergy.  TEE scheduled for 12/16 in case he cannot get 6 full weeks of daptomycin  at HD.  Following surveillance blood cultures.  Eventually echocardiogram was unremarkable.  Currently doing well.  Once cleared by ID, discharge with antibiotics.  EOT would be October 10, 2024.  Vascular to change TDC.  Patient's TDC catheter was exchanged on 12/17.  Doing well today.  Planning on discharging him.  Assessment & Plan:    Infected nonfunctional RUE AV graft s/p excision of graft MRSA bacteremia with concern for endocarditis Immunocompromised on Plaquenil  RUE AV graft was excised 12/5, s/p vein patch angioplasty, R brachial artery to brachial vein Graft growing out MRSA, blood cultures positive for MRSA 12/4 Surveillance blood cultures have been negative to date TEE is negative, 4 weeks of  antibiotics should suffice.  Daptomycin  EOT 10/10/2024.  TDC exchange by vascular on 12/17   ESRD on HD Hyperkalemia Followed by nephrology Nephrology to make outpatient HD arrangements   HTN On Norvasc , labetalol  IV as needed   SLE No evidence for acute flare Continue Plaquenil        DVT prophylaxis: Subcu heparin  Code Status: Full Family Communication: None today Status is: Inpatient Remains inpatient appropriate because: DC   PT Follow up Recs:   Subjective: Feeling well wishing to go home   Examination: General exam: Appears calm and comfortable  Respiratory system: Clear to auscultation. Respiratory effort normal. Cardiovascular system: S1 & S2 heard, RRR. No JVD, murmurs, rubs, gallops or clicks. No pedal edema. Gastrointestinal system: Abdomen is nondistended, soft and nontender. No organomegaly or masses felt. Normal bowel sounds heard. Central nervous system: Alert and oriented. No focal neurological deficits. Extremities: Symmetric 5 x 5 power. Skin: No rashes, lesions or ulcers Psychiatry: Judgement and insight appear normal. Mood & affect appropriate. Right chest wall TDC catheter noted without any signs of active bleeding   Discharge Diagnoses:  Principal Problem:   Staphylococcus epidermidis bacteremia Active Problems:   Hyperkalemia   Hypertensive urgency   Right arm cellulitis   ESRD on hemodialysis (HCC)   Arteriovenous graft infection      Discharge Exam: Vitals:   09/25/24 0839 09/25/24 0913  BP: (!) 88/63 116/69  Pulse: 79 73  Resp: 17   Temp: 98.2 F (36.8 C)   SpO2: 100%    Vitals:   09/24/24 2341 09/25/24 0508 09/25/24 0839 09/25/24 0913  BP: (!) 110/90 103/62 (!) 88/63 116/69  Pulse: 85 78 79 73  Resp:  18 17   Temp:  98.2 F (  36.8 C) 98.2 F (36.8 C)   TempSrc:  Oral Oral   SpO2:  92% 100%   Weight:      Height:          Discharge Instructions  Discharge Instructions     Home infusion instructions   Complete  by: As directed    To be given at the hemodialysis center   Instructions: Flushing of vascular access device: 0.9% NaCl pre/post medication administration and prn patency; Heparin  100 u/ml, 5ml for implanted ports and Heparin  10u/ml, 5ml for all other central venous catheters.      Allergies as of 09/25/2024       Reactions   Vancomycin Hives   Bactoshield Chg [chlorhexidine  Gluconate] Itching   Patient reports irritation w/ CHG, previously said scratched until bled on buttocks   Esomeprazole Rash        Medication List     TAKE these medications    albuterol  108 (90 Base) MCG/ACT inhaler Commonly known as: VENTOLIN  HFA Inhale 2 puffs into the lungs every 4 (four) hours as needed for wheezing or shortness of breath.   amLODipine  10 MG tablet Commonly known as: NORVASC  Take 10 mg by mouth every morning.   daptomycin  IVPB Commonly known as: CUBICIN  Inject 500 mg into the vein every Monday, Wednesday, and Friday with hemodialysis for 20 days. Indication:  MRSE bacteremia + AVG infection Last Day of Therapy:  10/14/24 Labs - Once weekly:  CBC/D, BMP, and CPK, ESR and CRP Method of administration: Per hemodialysis protocol - to be given at the HD center   hydroxychloroquine  200 MG tablet Commonly known as: PLAQUENIL  Take 200 mg by mouth every morning.   labetalol  100 MG tablet Commonly known as: NORMODYNE  Take 100 mg by mouth 2 (two) times daily.   montelukast  10 MG tablet Commonly known as: SINGULAIR  Take 10 mg by mouth daily.   sevelamer  carbonate 800 MG tablet Commonly known as: RENVELA  TAKE TWO TABLETS BY MOUTH WITH MEALS AND 1 WITH SNACKS. SWALLOW TABLET WHOLE. DO NOT CRUSH, BREAK, OR CHEW               Home Infusion Instuctions  (From admission, onward)           Start     Ordered   09/24/24 0000  Home infusion instructions       Comments: To be given at the hemodialysis center  Question:  Instructions  Answer:  Flushing of vascular access  device: 0.9% NaCl pre/post medication administration and prn patency; Heparin  100 u/ml, 5ml for implanted ports and Heparin  10u/ml, 5ml for all other central venous catheters.   09/24/24 1453            Follow-up Information     Austin Mutton, MD Follow up on 09/17/2024.   Specialty: Internal Medicine Why: 1:00 pm for hospital follow up Contact information: [address] [place] [address] [phone]         [facility] [place] Follow up.   Specialty: Vascular Surgery Why: 2-3 weeks for incision check.The office will call you with your appointment Contact information: [address] [place]  [address] [phone]               Allergies[1]  You were cared for by a hospitalist during your hospital stay. If you have any questions about your discharge medications or the care you received while you were  in the hospital after you are discharged, you can call the unit and asked to speak with the hospitalist on call if the hospitalist that took care of you is not available. Once you are discharged, your primary care physician will handle any further medical issues. Please note that no refills for any discharge medications will be authorized once you are discharged, as it is imperative that you return to your primary care physician (or establish a relationship with a primary care physician if you do not have one) for your aftercare needs so that they can reassess your need for medications and monitor your lab values.  You were cared for by a hospitalist during your hospital stay. If you have any questions about your discharge medications or the care you received while you were in the hospital after you are discharged, you can call the unit and asked to speak with the hospitalist on call if the hospitalist that took care of you is not available. Once you are discharged, your primary care physician  will handle any further medical issues. Please note that NO REFILLS for any discharge medications will be authorized once you are discharged, as it is imperative that you return to your primary care physician (or establish a relationship with a primary care physician if you do not have one) for your aftercare needs so that they can reassess your need for medications and monitor your lab values.  Please request your Prim.MD to go over all Hospital Tests and Procedure/Radiological results at the follow up, please get all Hospital records sent to your Prim MD by signing hospital release before you go home.  Get CBC, CMP, 2 view Chest X ray checked  by Primary MD during your next visit or SNF MD in 5-7 days ( we routinely change or add medications that can affect your baseline labs and fluid status, therefore we recommend that you get the mentioned basic workup next visit with your PCP, your PCP may decide not to get them or add new tests based on their clinical decision)  On your next visit with your primary care physician please Get Medicines reviewed and adjusted.  If you experience worsening of your admission symptoms, develop shortness of breath, life threatening emergency, suicidal or homicidal thoughts you must seek medical attention immediately by calling 911 or calling your MD immediately  if symptoms less severe.  You Must read complete instructions/literature along with all the possible adverse reactions/side effects for all the Medicines you take and that have been prescribed to you. Take any new Medicines after you have completely understood and accpet all the possible adverse reactions/side effects.   Do not drive, operate heavy machinery, perform activities at heights, swimming or participation in water activities or provide baby sitting services if your were admitted for syncope or siezures until you have seen by Primary MD or a Neurologist and advised to do so again.  Do not drive when  taking Pain medications.   Procedures/Studies: PERIPHERAL VASCULAR CATHETERIZATION Result Date: 09/24/2024 Images from the original result were not included. Patient name: Jorge [name] MRN: [id number] DOB: July 04, 1981 Sex: male 09/24/2024 Pre-operative Diagnosis: End-stage renal disease requiring dialysis Post-operative diagnosis:  Same Surgeon:  Fonda FORBES Rim, MD Procedure Performed: 1.  Right internal jugular tunneled dialysis catheter exchange - 19 cm palindrome Indications: Patient is a 43 year old male with history of end-stage renal disease with tunneled dialysis catheter which is not functioning appropriately.  I have discussed the risks and benefits, Albaro elected to proceed.  Findings: Tunneled dialysis catheter exchange with the tip placed at the cavoatrial junction.  Procedure:  Patient was brought to the Cath Lab laid in supine position.  Moderate anesthesia was induced and the patient was prepped draped standard fashion.  The case began with wiring the previous 19 cm palindrome catheter which was placed in the right internal jugular vein and laid on the right chest.  The felt bumper was then removed from surrounding soft tissue using sharp dissection.  Next, the catheter was removed over the wire and a new set 19 cm tunneled palindrome catheter brought into the field and replaced in the same tunnel track.  The tip of the catheter was placed at the cavoatrial junction.  It flushed nicely.  The catheter was sewn in place.  Can be used for dialysis. Fonda FORBES Rim MD [facility] Office: [phone]   ECHO TEE Result Date: 09/23/2024    TRANSESOPHOGEAL ECHO REPORT   Patient Name:   Jorge [name] Date of Exam: 09/23/2024 Medical Rec #:  [id number]       Height:       68.0 in Accession #:    [id number]      Weight:       147.3 lb Date of Birth:  [birth year]       BSA:          1.794 m Patient Age:    43 years        BP:           136/97 mmHg Patient Gender: M                HR:           84 bpm. Exam Location:  Inpatient Procedure: Transesophageal Echo, Color Doppler and Cardiac Doppler (Both            Spectral and Color Flow Doppler were utilized during procedure). Indications:     Endocarditis  History:         Patient has prior history of Echocardiogram examinations.                  Endocarditis; Signs/Symptoms:Bacteremia.  Sonographer:     Koleen Popper RDCS Referring Phys:  [id number] MAUDE JAYSON EMMER Diagnosing Phys: [name] MD PROCEDURE: After discussion of the risks and benefits of a TEE, an informed consent was obtained from the patient. The transesophogeal probe was passed without difficulty through the esophogus of the patient. Imaged were obtained with the patient in a left lateral decubitus position. Sedation performed by different physician. The patient was monitored while under deep sedation. Anesthestetic sedation was provided intravenously by Anesthesiology: 135mg  of Propofol , 80mg  of Lidocaine . Image quality was technically difficult. The patient's vital signs; including heart rate, blood pressure, and oxygen saturation; remained stable throughout the procedure. The patient developed no complications during the procedure. Transgastric views not obtained: patient did not tolerate.  IMPRESSIONS  1. Philips mini probe used due to history of strictures. No problems passing probe Study terminated early due to apnea and bronchospasm Sats never dropped Bronchospasm and apnea resolved after probe removed.  2. Left ventricular ejection fraction, by estimation, is 60 to 65%. The left ventricle has normal function. The left ventricle has no regional wall motion abnormalities.  3. Right ventricular systolic function is normal. The right ventricular size is normal.  4. No left atrial/left atrial appendage thrombus was detected.  5. The mitral valve is normal in structure. No evidence of mitral  valve regurgitation. No evidence of mitral stenosis.  6. The aortic valve is  normal in structure. Aortic valve regurgitation is not visualized. No aortic stenosis is present.  7. The inferior vena cava is normal in size with greater than 50% respiratory variability, suggesting right atrial pressure of 3 mmHg.  8. None and demonstrates None performed. Conclusion(s)/Recommendation(s): Normal biventricular function without evidence of hemodynamically significant valvular heart disease. FINDINGS  Left Ventricle: Left ventricular ejection fraction, by estimation, is 60 to 65%. The left ventricle has normal function. The left ventricle has no regional wall motion abnormalities. The left ventricular internal cavity size was normal in size. There is  no left ventricular hypertrophy. Right Ventricle: The right ventricular size is normal. No increase in right ventricular wall thickness. Right ventricular systolic function is normal. Left Atrium: Left atrial size was normal in size. No left atrial/left atrial appendage thrombus was detected. Right Atrium: Right atrial size was normal in size. Pericardium: There is no evidence of pericardial effusion. Mitral Valve: The mitral valve is normal in structure. No evidence of mitral valve regurgitation. No evidence of mitral valve stenosis. Tricuspid Valve: The tricuspid valve is normal in structure. Tricuspid valve regurgitation is not demonstrated. No evidence of tricuspid stenosis. Aortic Valve: The aortic valve is normal in structure. Aortic valve regurgitation is not visualized. No aortic stenosis is present. Pulmonic Valve: The pulmonic valve was normal in structure. Pulmonic valve regurgitation is not visualized. No evidence of pulmonic stenosis. Aorta: The aortic root is normal in size and structure. Venous: The inferior vena cava is normal in size with greater than 50% respiratory variability, suggesting right atrial pressure of 3 mmHg. IAS/Shunts: No atrial level shunt detected by color flow Doppler. Additional Comments: Philips mini probe used due  to history of strictures. No problems passing probe Study terminated early due to apnea and bronchospasm Sats never dropped Bronchospasm and apnea resolved after probe removed. 3D was performed not requiring image post processing on an independent workstation and was indeterminate. LEFT VENTRICLE PLAX 2D LVOT diam:     1.90 cm LVOT Area:     2.84 cm   AORTA Ao Root diam: 2.90 cm  SHUNTS Systemic Diam: 1.90 cm [name] MD Electronically signed by [name] MD Signature Date/Time: 09/23/2024/9:02:23 AM    Final    EP STUDY Result Date: 09/23/2024 See surgical note for result.  ECHOCARDIOGRAM COMPLETE Result Date: 09/16/2024    ECHOCARDIOGRAM REPORT   Patient Name:   Jorge [name] Date of Exam: 09/16/2024 Medical Rec #:  [id number]       Height:       68.0 in Accession #:    [id number]      Weight:       144.8 lb Date of Birth:  1980/12/31       BSA:          1.782 m Patient Age:    43 years        BP:           114/79 mmHg Patient Gender: M               HR:           80 bpm. Exam Location:  Inpatient Procedure: 2D Echo, Cardiac Doppler and Color Doppler (Both Spectral and Color            Flow Doppler were utilized during procedure). Indications:    Bacteremia  History:        Patient has no  prior history of Echocardiogram examinations.                 Risk Factors:Hypertension.  Sonographer:    Philomena Daring Referring Phys: ANNALEE OREM IMPRESSIONS  1. Left ventricular ejection fraction, by estimation, is 60 to 65%. The left ventricle has normal function. The left ventricle has no regional wall motion abnormalities. There is mild concentric left ventricular hypertrophy. Left ventricular diastolic parameters were normal.  2. Right ventricular systolic function is normal. The right ventricular size is normal.  3. A small pericardial effusion is present. The pericardial effusion is circumferential.  4. The mitral valve is normal in structure. Trivial mitral valve regurgitation. No evidence of mitral  stenosis.  5. The aortic valve is normal in structure. Aortic valve regurgitation is not visualized. No aortic stenosis is present.  6. The inferior vena cava is normal in size with greater than 50% respiratory variability, suggesting right atrial pressure of 3 mmHg. FINDINGS  Left Ventricle: Left ventricular ejection fraction, by estimation, is 60 to 65%. The left ventricle has normal function. The left ventricle has no regional wall motion abnormalities. The left ventricular internal cavity size was normal in size. There is  mild concentric left ventricular hypertrophy. Left ventricular diastolic parameters were normal. Right Ventricle: The right ventricular size is normal. No increase in right ventricular wall thickness. Right ventricular systolic function is normal. Left Atrium: Left atrial size was normal in size. Right Atrium: Right atrial size was normal in size. Pericardium: A small pericardial effusion is present. The pericardial effusion is circumferential. Mitral Valve: The mitral valve is normal in structure. Trivial mitral valve regurgitation. No evidence of mitral valve stenosis. Tricuspid Valve: The tricuspid valve is normal in structure. Tricuspid valve regurgitation is not demonstrated. No evidence of tricuspid stenosis. Aortic Valve: The aortic valve is normal in structure. Aortic valve regurgitation is not visualized. No aortic stenosis is present. Aortic valve mean gradient measures 6.0 mmHg. Aortic valve peak gradient measures 11.8 mmHg. Aortic valve area, by VTI measures 1.70 cm. Pulmonic Valve: The pulmonic valve was normal in structure. Pulmonic valve regurgitation is not visualized. No evidence of pulmonic stenosis. Aorta: The aortic root is normal in size and structure. Venous: The inferior vena cava is normal in size with greater than 50% respiratory variability, suggesting right atrial pressure of 3 mmHg. IAS/Shunts: No atrial level shunt detected by color flow Doppler.  LEFT VENTRICLE  PLAX 2D LVIDd:         4.00 cm   Diastology LVIDs:         2.60 cm   LV e' medial:    8.27 cm/s LV PW:         1.00 cm   LV E/e' medial:  11.1 LV IVS:        1.20 cm   LV e' lateral:   10.70 cm/s LVOT diam:     1.90 cm   LV E/e' lateral: 8.6 LV SV:         52 LV SV Index:   29 LVOT Area:     2.84 cm  RIGHT VENTRICLE             IVC RV Basal diam:  3.20 cm     IVC diam: 1.50 cm RV Mid diam:    2.80 cm RV S prime:     11.00 cm/s TAPSE (M-mode): 1.8 cm LEFT ATRIUM             Index  RIGHT ATRIUM           Index LA diam:        2.50 cm 1.40 cm/m   RA Area:     11.90 cm LA Vol (A2C):   39.4 ml 22.11 ml/m  RA Volume:   21.40 ml  12.01 ml/m LA Vol (A4C):   32.1 ml 18.02 ml/m LA Biplane Vol: 36.9 ml 20.71 ml/m  AORTIC VALVE AV Area (Vmax):    1.83 cm AV Area (Vmean):   1.73 cm AV Area (VTI):     1.70 cm AV Vmax:           172.00 cm/s AV Vmean:          118.000 cm/s AV VTI:            0.308 m AV Peak Grad:      11.8 mmHg AV Mean Grad:      6.0 mmHg LVOT Vmax:         111.00 cm/s LVOT Vmean:        71.800 cm/s LVOT VTI:          0.185 m LVOT/AV VTI ratio: 0.60  AORTA Ao Root diam: 2.50 cm MITRAL VALVE                TRICUSPID VALVE MV Area (PHT): 4.57 cm     TR Peak grad:   21.0 mmHg MV Decel Time: 166 msec     TR Vmax:        229.00 cm/s MV E velocity: 91.70 cm/s MV A velocity: 102.00 cm/s  SHUNTS MV E/A ratio:  0.90         Systemic VTI:  0.18 m                             Systemic Diam: 1.90 cm Kardie Tobb DO Electronically signed by Dub Huntsman DO Signature Date/Time: 09/16/2024/4:20:40 PM    Final    UE VENOUS DUPLEX (7am - 7pm) Result Date: 09/14/2024 UPPER VENOUS STUDY  Patient Name:  Jorge [name]  Date of Exam:   09/14/2024 Medical Rec #: [id number]        Accession #:    [id number] Date of Birth: 12-15-1980        Patient Gender: M Patient Age:   72 years Exam Location:  [facility] Procedure:      VAS US  UPPER EXTREMITY VENOUS DUPLEX Referring Phys: [facility]  --------------------------------------------------------------------------------  Indications: Swelling, and Pain Other Indications: Status post - EXCISION OF RIGHT UPPER ARM VEIN GRAFT 09/12/24. Limitations: Poor ultrasound/tissue interface and right upper arm painful to touch. Performing Technologist: Ricka Sturdivant-Jones RDMS, RVT  Examination Guidelines: A complete evaluation includes B-mode imaging, spectral Doppler, color Doppler, and power Doppler as needed of all accessible portions of each vessel. Bilateral testing is considered an integral part of a complete examination. Limited examinations for reoccurring indications may be performed as noted.  Right Findings: +----------+------------+---------+-----------+----------+--------------+ RIGHT     CompressiblePhasicitySpontaneousProperties   Summary     +----------+------------+---------+-----------+----------+--------------+ IJV           Full       Yes       Yes                             +----------+------------+---------+-----------+----------+--------------+ Subclavian               Yes  No               dampened flow  +----------+------------+---------+-----------+----------+--------------+ Axillary                 Yes       Yes                             +----------+------------+---------+-----------+----------+--------------+ Brachial                 Yes       Yes                             +----------+------------+---------+-----------+----------+--------------+ Radial        Full                                                 +----------+------------+---------+-----------+----------+--------------+ Ulnar         Full                                                 +----------+------------+---------+-----------+----------+--------------+ Cephalic      Full                                                 +----------+------------+---------+-----------+----------+--------------+ Basilic                                              Not visualized +----------+------------+---------+-----------+----------+--------------+ Right upper arm patent with color. Limted evaluation, too painful to compress veins. Right subclavian vein is patent with dampened continuous flow which may suggest a more proximal obstruction.  Left Findings: +----------+------------+---------+-----------+----------+-------+ LEFT      CompressiblePhasicitySpontaneousPropertiesSummary +----------+------------+---------+-----------+----------+-------+ Subclavian               Yes       Yes                      +----------+------------+---------+-----------+----------+-------+  Summary:  Right: The right upper extremity appears patent with color doppler with no evidence of deep vein thrombosis in the veins that were clearly visualized.  Left: No evidence of thrombosis in the subclavian.  *See table(s) above for measurements and observations.  Diagnosing physician: Debby Robertson Electronically signed by Debby Robertson on 09/14/2024 at 8:39:11 PM.    Final    CT HUMERUS RIGHT W CONTRAST Result Date: 09/11/2024 CLINICAL DATA:  Soft tissue mass of upper arm. Recent graft placement. EXAM: CT OF THE UPPER RIGHT EXTREMITY WITH CONTRAST TECHNIQUE: Multidetector CT imaging of the upper right extremity was performed according to the standard protocol following intravenous contrast administration. RADIATION DOSE REDUCTION: This exam was performed according to the departmental dose-optimization program which includes automated exposure control, adjustment of the mA and/or kV according to patient size and/or use of iterative reconstruction technique. CONTRAST:  75mL OMNIPAQUE  IOHEXOL  350 MG/ML SOLN COMPARISON:  None Available. FINDINGS: There is no  acute fracture or focal osseous lesion. There is no significant elbow or shoulder joint effusion. A graft is present connecting the left axillary vein to a vessel at the level of the  distal humerus. Graft is grossly patent, but evaluation is limited secondary to timing of the contrast bolus. There is mild subcutaneous stranding surrounding the entire graft. Adjacent to the distal 2 cm of the graft there is an enhancing fluid collection measuring 2.7 x 2.2 by 2.8 cm. This abuts the graft. There is no contrast opacification within this collection. There is surrounding inflammatory stranding. Multiple superficial collateral vessels are seen along the right anterior chest wall and within the proximal right arm. There is a right upper lobe nodule measuring 5 mm on image 4/50. IMPRESSION: 1. 2.8 cm enhancing fluid collection adjacent to the distal 2 cm of the graft in the right upper extremity. This is concerning for abscess. 2. Mild subcutaneous stranding surrounding the entire graft. 3. 5 mm right upper lobe pulmonary nodule. No follow-up needed if patient is low-risk.This recommendation follows the consensus statement: Guidelines for Management of Incidental Pulmonary Nodules Detected on CT Images: From the Fleischner Society 2017; Radiology 2017; 284:228-243. Electronically Signed   By: Greig Pique M.D.   On: 09/11/2024 23:11     The results of significant diagnostics from this hospitalization (including imaging, microbiology, ancillary and laboratory) are listed below for reference.     Microbiology: Recent Results (from the past 240 hours)  Culture, blood (Routine X 2) w Reflex to ID Panel     Status: None   Collection Time: 09/16/24  3:00 PM   Specimen: BLOOD  Result Value Ref Range Status   Specimen Description BLOOD SITE NOT SPECIFIED  Final   Special Requests   Final    BOTTLES DRAWN AEROBIC AND ANAEROBIC Blood Culture adequate volume   Culture   Final    NO GROWTH 5 DAYS Performed at [facility], [address], [place], [place] [address]    Report Status 09/21/2024 FINAL  Final  Culture, blood (Routine X 2) w Reflex to ID Panel     Status: None   Collection  Time: 09/16/24  3:12 PM   Specimen: BLOOD LEFT HAND  Result Value Ref Range Status   Specimen Description BLOOD LEFT HAND  Final   Special Requests   Final    BOTTLES DRAWN AEROBIC AND ANAEROBIC Blood Culture adequate volume   Culture   Final    NO GROWTH 5 DAYS Performed at [facility], [address] [place], [place] [address]    Report Status 09/21/2024 FINAL  Final  MIC (1 Drug)-     Status: None   Collection Time: 09/19/24  8:47 AM  Result Value Ref Range Status   Min Inhibitory Conc (1 Drug) Preliminary report  Final    Comment: (NOTE) Performed At: [facility] [address] [place], [place] [id number] Jennette Shorter MD Ey:[id number]    Source lab [phone]  Final    Comment: Performed at [facility], [address], [place], [place] [address]  MIC Result     Status: None   Collection Time: 09/19/24  8:47 AM  Result Value Ref Range Status   Result 1 (MIC) Comment  Final    Comment: (NOTE) Staphylococcus epidermidis Identification performed by account, not confirmed by this laboratory. Daptomycin  Performed At: [facility] [address] [place], [place] [id number] Jennette Shorter MD Ey:[id number]      Labs:  BNP (last 3 results) No results for input(s): BNP in the last 8760 hours. Basic Metabolic Panel: Recent Labs  Lab 09/19/24 0807 09/22/24 0358 09/23/24 0444 09/24/24 0400  NA 133* 138 134* 134*  K 4.8 5.8* 5.5* 5.6*  CL 94* 100 94* 94*  CO2 28 24 31 25   GLUCOSE 119* 86 82 84  BUN 50* 67* 40* 59*  CREATININE 10.58* 12.68* 8.39* 10.60*  CALCIUM 8.7* 9.0 9.1 9.3  MG  --   --  2.1  --   PHOS 8.5*  --   --  8.3*   Liver Function Tests: Recent Labs  Lab 09/19/24 0807 09/24/24 0400  ALBUMIN  3.0* 3.8   No results for input(s): LIPASE, AMYLASE in the last 168 hours. No results for input(s): AMMONIA in the last 168 hours. CBC: Recent Labs  Lab 09/19/24 0437 09/19/24 0807 09/22/24 0358 09/23/24 0444  09/24/24 0400  WBC 5.2 5.1 7.5 6.4 6.1  NEUTROABS  --   --   --   --  2.9  HGB 9.4* 8.9* 9.0* 9.8* 9.0*  HCT 29.4* 28.5* 28.1* 30.5* 27.9*  MCV 87.5 88.2 86.7 87.9 87.2  PLT 217 222 198 206 210   Cardiac Enzymes: Recent Labs  Lab 09/24/24 0400  CKTOTAL 36*   BNP: Invalid input(s): POCBNP CBG: No results for input(s): GLUCAP in the last 168 hours. D-Dimer No results for input(s): DDIMER in the last 72 hours. Hgb A1c No results for input(s): HGBA1C in the last 72 hours. Lipid Profile No results for input(s): CHOL, HDL, LDLCALC, TRIG, CHOLHDL, LDLDIRECT in the last 72 hours. Thyroid function studies No results for input(s): TSH, T4TOTAL, T3FREE, THYROIDAB in the last 72 hours.  Invalid input(s): FREET3 Anemia work up No results for input(s): VITAMINB12, FOLATE, FERRITIN, TIBC, IRON, RETICCTPCT in the last 72 hours. Urinalysis    Component Value Date/Time   COLORURINE YELLOW 12/05/2023 1055   APPEARANCEUR HAZY (A) 12/05/2023 1055   LABSPEC 1.022 12/05/2023 1055   PHURINE 8.0 12/05/2023 1055   GLUCOSEU 50 (A) 12/05/2023 1055   HGBUR NEGATIVE 12/05/2023 1055   BILIRUBINUR NEGATIVE 12/05/2023 1055   KETONESUR NEGATIVE 12/05/2023 1055   PROTEINUR >=300 (A) 12/05/2023 1055   UROBILINOGEN 0.2 03/10/2015 1437   NITRITE NEGATIVE 12/05/2023 1055   LEUKOCYTESUR NEGATIVE 12/05/2023 1055   Sepsis Labs Recent Labs  Lab 09/19/24 0807 09/22/24 0358 09/23/24 0444 09/24/24 0400  WBC 5.1 7.5 6.4 6.1   Microbiology Recent Results (from the past 240 hours)  Culture, blood (Routine X 2) w Reflex to ID Panel     Status: None   Collection Time: 09/16/24  3:00 PM   Specimen: BLOOD  Result Value Ref Range Status   Specimen Description BLOOD SITE NOT SPECIFIED  Final   Special Requests   Final    BOTTLES DRAWN AEROBIC AND ANAEROBIC Blood Culture adequate volume   Culture   Final    NO GROWTH 5 DAYS Performed at [facility],  [address], [place], [place] [address]    Report Status 09/21/2024 FINAL  Final  Culture, blood (Routine X 2) w Reflex to ID Panel     Status: None   Collection Time: 09/16/24  3:12 PM   Specimen: BLOOD LEFT HAND  Result Value Ref Range Status   Specimen Description BLOOD LEFT HAND  Final   Special Requests   Final    BOTTLES DRAWN AEROBIC AND ANAEROBIC Blood Culture adequate volume   Culture   Final  NO GROWTH 5 DAYS Performed at [facility], [address], [place], [place] [address]    Report Status 09/21/2024 FINAL  Final  MIC (1 Drug)-     Status: None   Collection Time: 09/19/24  8:47 AM  Result Value Ref Range Status   Min Inhibitory Conc (1 Drug) Preliminary report  Final    Comment: (NOTE) Performed At: [facility] [address] [place], [place] [id number] Jennette Shorter MD Ey:[id number]    Source lab [phone]  Final    Comment: Performed at [facility], [address] [place], [place] [address]  MIC Result     Status: None   Collection Time: 09/19/24  8:47 AM  Result Value Ref Range Status   Result 1 (MIC) Comment  Final    Comment: (NOTE) Staphylococcus epidermidis Identification performed by account, not confirmed by this laboratory. Daptomycin  Performed At: [facility] [address] [place], [place] [id number] Jennette Shorter MD Ey:[phone]      Time coordinating discharge:  I have spent 35 minutes face to face with the patient and on the ward discussing the patients care, assessment, plan and disposition with other care givers. >50% of the time was devoted counseling the patient about the risks and benefits of treatment/Discharge disposition and coordinating care.   SIGNED:   Burgess JAYSON Dare, MD  [facility] 09/25/2024, 10:51 AM   If 7PM-7AM, please contact night-coverage      [1]  Allergies Allergen Reactions   Vancomycin Hives   Bactoshield Chg [Chlorhexidine  Gluconate] Itching    Patient reports  irritation w/ CHG, previously said scratched until bled on buttocks   Esomeprazole Rash

## 2024-09-25 NOTE — Progress Notes (Signed)
 PROGRESS NOTE    Jorge [name]  FMW:[id number] DOB: 02-06-1981 DOA: 09/11/2024 PCP: Austin Mutton, MD    Brief Narrative:   43 year old man with ESRD on HD, HTN, SLE on hydroxychloroquine , GERD was admitted infected nonfunctional right arm AV graft.  Patient underwent excision of the RUE AV graft with drainage and vein angioplasty.  Now with MRSA bacteremia with concern for endocarditis.  Patient was treated with cefepime , ID consulting recommends daptomycin  to complete 6 weeks course however there has been difficulty obtaining daptomycin  at his hemodialysis center.  Patient history of vancomycin allergy.  TEE scheduled for 12/16 in case he cannot get 6 full weeks of daptomycin  at HD.  Following surveillance blood cultures.  Eventually echocardiogram was unremarkable.  Currently doing well.  Once cleared by ID, discharge with antibiotics.  EOT would be October 10, 2024.  Vascular to change TDC.  Patient's TDC catheter was exchanged on 12/17.  Doing well today.  Planning on discharging him.  Assessment & Plan:    Infected nonfunctional RUE AV graft s/p excision of graft MRSA bacteremia with concern for endocarditis Immunocompromised on Plaquenil  RUE AV graft was excised 12/5, s/p vein patch angioplasty, R brachial artery to brachial vein Graft growing out MRSA, blood cultures positive for MRSA 12/4 Surveillance blood cultures have been negative to date TEE is negative, 4 weeks of antibiotics should suffice.  Daptomycin  EOT 10/10/2024.  TDC exchange by vascular on 12/17   ESRD on HD Hyperkalemia Followed by nephrology Nephrology to make outpatient HD arrangements   HTN On Norvasc , labetalol  IV as needed   SLE No evidence for acute flare Continue Plaquenil        DVT prophylaxis: Subcu heparin  Code Status: Full Family Communication: None today Status is: Inpatient Remains inpatient appropriate because: DC   PT Follow up Recs:   Subjective: Feeling well wishing to go  home   Examination: General exam: Appears calm and comfortable  Respiratory system: Clear to auscultation. Respiratory effort normal. Cardiovascular system: S1 & S2 heard, RRR. No JVD, murmurs, rubs, gallops or clicks. No pedal edema. Gastrointestinal system: Abdomen is nondistended, soft and nontender. No organomegaly or masses felt. Normal bowel sounds heard. Central nervous system: Alert and oriented. No focal neurological deficits. Extremities: Symmetric 5 x 5 power. Skin: No rashes, lesions or ulcers Psychiatry: Judgement and insight appear normal. Mood & affect appropriate. Right chest wall TDC catheter noted without any signs of active bleeding               Diet Orders (From admission, onward)     Start     Ordered   09/24/24 1507  Diet renal with fluid restriction Fluid restriction: 1200 mL Fluid; Room service appropriate? Yes; Fluid consistency: Thin  Diet effective now       Question Answer Comment  Fluid restriction: 1200 mL Fluid   Room service appropriate? Yes   Fluid consistency: Thin      09/24/24 1506            Objective: Vitals:   09/24/24 2341 09/25/24 0508 09/25/24 0839 09/25/24 0913  BP: (!) 110/90 103/62 (!) 88/63 116/69  Pulse: 85 78 79 73  Resp:  18 17   Temp:  98.2 F (36.8 C) 98.2 F (36.8 C)   TempSrc:  Oral Oral   SpO2:  92% 100%   Weight:      Height:        Intake/Output Summary (Last 24 hours) at 09/25/2024 1021 Last data filed at 09/24/2024 1223  Gross per 24 hour  Intake --  Output 800 ml  Net -800 ml   Filed Weights   09/22/24 0821 09/24/24 0820 09/24/24 1223  Weight: 66.8 kg 67.4 kg 67.3 kg    Scheduled Meds:  amLODipine   10 mg Oral q morning   Chlorhexidine  Gluconate Cloth  6 each Topical Q0600   darbepoetin (ARANESP ) injection - DIALYSIS  40 mcg Subcutaneous Q Fri-1800   heparin   5,000 Units Subcutaneous Q8H   hydroxychloroquine   200 mg Oral BH-q7a   labetalol   100 mg Oral BID   montelukast   10 mg Oral  Daily   sevelamer  carbonate  1,600 mg Oral TID WC   sodium chloride  flush  10-40 mL Intracatheter Q12H   Continuous Infusions:  DAPTOmycin  500 mg (09/24/24 1814)    Nutritional status     Body mass index is 22.56 kg/m.  Data Reviewed:   CBC: Recent Labs  Lab 09/19/24 0437 09/19/24 0807 09/22/24 0358 09/23/24 0444 09/24/24 0400  WBC 5.2 5.1 7.5 6.4 6.1  NEUTROABS  --   --   --   --  2.9  HGB 9.4* 8.9* 9.0* 9.8* 9.0*  HCT 29.4* 28.5* 28.1* 30.5* 27.9*  MCV 87.5 88.2 86.7 87.9 87.2  PLT 217 222 198 206 210   Basic Metabolic Panel: Recent Labs  Lab 09/19/24 0807 09/22/24 0358 09/23/24 0444 09/24/24 0400  NA 133* 138 134* 134*  K 4.8 5.8* 5.5* 5.6*  CL 94* 100 94* 94*  CO2 28 24 31 25   GLUCOSE 119* 86 82 84  BUN 50* 67* 40* 59*  CREATININE 10.58* 12.68* 8.39* 10.60*  CALCIUM 8.7* 9.0 9.1 9.3  MG  --   --  2.1  --   PHOS 8.5*  --   --  8.3*   GFR: Estimated Creatinine Clearance: 8.7 mL/min (A) (by C-G formula based on SCr of 10.6 mg/dL (H)). Liver Function Tests: Recent Labs  Lab 09/19/24 0807 09/24/24 0400  ALBUMIN  3.0* 3.8   No results for input(s): LIPASE, AMYLASE in the last 168 hours. No results for input(s): AMMONIA in the last 168 hours. Coagulation Profile: No results for input(s): INR, PROTIME in the last 168 hours. Cardiac Enzymes: Recent Labs  Lab 09/24/24 0400  CKTOTAL 36*   BNP (last 3 results) No results for input(s): PROBNP in the last 8760 hours. HbA1C: No results for input(s): HGBA1C in the last 72 hours. CBG: No results for input(s): GLUCAP in the last 168 hours. Lipid Profile: No results for input(s): CHOL, HDL, LDLCALC, TRIG, CHOLHDL, LDLDIRECT in the last 72 hours. Thyroid Function Tests: No results for input(s): TSH, T4TOTAL, FREET4, T3FREE, THYROIDAB in the last 72 hours. Anemia Panel: No results for input(s): VITAMINB12, FOLATE, FERRITIN, TIBC, IRON, RETICCTPCT in the  last 72 hours. Sepsis Labs: No results for input(s): PROCALCITON, LATICACIDVEN in the last 168 hours.  Recent Results (from the past 240 hours)  Culture, blood (Routine X 2) w Reflex to ID Panel     Status: None   Collection Time: 09/16/24  3:00 PM   Specimen: BLOOD  Result Value Ref Range Status   Specimen Description BLOOD SITE NOT SPECIFIED  Final   Special Requests   Final    BOTTLES DRAWN AEROBIC AND ANAEROBIC Blood Culture adequate volume   Culture   Final    NO GROWTH 5 DAYS Performed at [facility], [address], [place], [place] [address]    Report Status 09/21/2024 FINAL  Final  Culture, blood (  Routine X 2) w Reflex to ID Panel     Status: None   Collection Time: 09/16/24  3:12 PM   Specimen: BLOOD LEFT HAND  Result Value Ref Range Status   Specimen Description BLOOD LEFT HAND  Final   Special Requests   Final    BOTTLES DRAWN AEROBIC AND ANAEROBIC Blood Culture adequate volume   Culture   Final    NO GROWTH 5 DAYS Performed at [facility], [address], [place], [place] [address]    Report Status 09/21/2024 FINAL  Final  MIC (1 Drug)-     Status: None   Collection Time: 09/19/24  8:47 AM  Result Value Ref Range Status   Min Inhibitory Conc (1 Drug) Preliminary report  Final    Comment: (NOTE) Performed At: [facility] [address] [place], [place] [id number] Jennette Shorter MD Ey:[id number]    Source lab [phone]  Final    Comment: Performed at [facility], [address], [place], [place] [address]  MIC Result     Status: None   Collection Time: 09/19/24  8:47 AM  Result Value Ref Range Status   Result 1 (MIC) Comment  Final    Comment: (NOTE) Staphylococcus epidermidis Identification performed by account, not confirmed by this laboratory. Daptomycin  Performed At: [facility] [address] [place], [place] [id number] Jennette Shorter MD Ey:[id number]          Radiology Studies: PERIPHERAL VASCULAR  CATHETERIZATION Result Date: 09/24/2024 Images from the original result were not included. Patient name: Jorge [name] MRN: [id number] DOB: Sep 25, 1981 Sex: male 09/24/2024 Pre-operative Diagnosis: End-stage renal disease requiring dialysis Post-operative diagnosis:  Same Surgeon:  Fonda FORBES Rim, MD Procedure Performed: 1.  Right internal jugular tunneled dialysis catheter exchange - 19 cm palindrome Indications: Patient is a 43 year old male with history of end-stage renal disease with tunneled dialysis catheter which is not functioning appropriately.  I have discussed the risks and benefits, Zeph elected to proceed. Findings: Tunneled dialysis catheter exchange with the tip placed at the cavoatrial junction.  Procedure:  Patient was brought to the Cath Lab laid in supine position.  Moderate anesthesia was induced and the patient was prepped draped standard fashion.  The case began with wiring the previous 19 cm palindrome catheter which was placed in the right internal jugular vein and laid on the right chest.  The felt bumper was then removed from surrounding soft tissue using sharp dissection.  Next, the catheter was removed over the wire and a new set 19 cm tunneled palindrome catheter brought into the field and replaced in the same tunnel track.  The tip of the catheter was placed at the cavoatrial junction.  It flushed nicely.  The catheter was sewn in place.  Can be used for dialysis. Fonda FORBES Rim MD [facility] Office: [phone]           LOS: 13 days   Time spent= 35 mins    Burgess JAYSON Dare, MD [facility]  If 7PM-7AM, please contact night-coverage  09/25/2024, 10:21 AM

## 2024-09-26 LAB — CULTURE, BLOOD (ROUTINE X 2)

## 2024-09-29 ENCOUNTER — Ambulatory Visit: Payer: Self-pay | Admitting: Infectious Diseases

## 2024-10-01 NOTE — Progress Notes (Deleted)
" ° ° °  Postoperative Access Visit   History of Present Illness   Jorge [name] is a 43 y.o. year old male who presents for postoperative follow-up for:  1.  Excision of right upper arm AV graft with drainage of seroma 2.  Vein patch angioplasty right brachial artery with brachial vein by Dr. Gretta on 09/12/24. The patient's wounds are *** healed.  The patient notes *** steal symptoms.  The patient is *** able to complete their activities of daily living.  He currently dialyzes on *** at *** via right internal jugular TDC. This was just exchanged by Dr. Lanis on 09/25/24. He reports it is functioning ***   Physical Examination  There were no vitals filed for this visit. There is no height or weight on file to calculate BMI.  {side of body:30421359} arm Incision is *** healed, *** radial pulse, hand grip is ***/5, sensation in digits is *** intact, ***palpable thrill, bruit can *** be auscultated     Medical Decision Making   Jorge [name] is a 43 y.o. year old male who presents s/p {side of body:30421359} {AccessOptions:19197::thigh arteriovenous graft,upper arm arteriovenous graft,forearm arteriovenous graft,first stage brachial vein transposition,second stage brachial vein transposition,single stage brachial vein transposition,first stage basilic vein transposition,second stage basilic vein transposition,single stage basilic vein transposition,radiocephalic arteriovenous fistula,brachiocephalic arteriovenous fistula}  Patent *** without signs or symptoms of steal syndrome The patient's access will be ready for use *** ***The patient's tunneled dialysis catheter can be removed when Nephrology is comfortable with the performance of the *** The patient may follow up on a prn basis   Jorge Damme, PA-C [facility] Office: [phone]  Clinic MD: Magda  "

## 2024-10-06 ENCOUNTER — Inpatient Hospital Stay: Payer: Self-pay | Admitting: Infectious Diseases

## 2024-10-07 ENCOUNTER — Encounter
